# Patient Record
Sex: Female | Born: 1940
Health system: Southern US, Community
[De-identification: ages and names within clinical notes are randomized; demographics above are authoritative.]

## PROBLEM LIST (undated history)

## (undated) DIAGNOSIS — K449 Diaphragmatic hernia without obstruction or gangrene: Secondary | ICD-10-CM

## (undated) DIAGNOSIS — K5730 Diverticulosis of large intestine without perforation or abscess without bleeding: Secondary | ICD-10-CM

## (undated) DIAGNOSIS — I10 Essential (primary) hypertension: Secondary | ICD-10-CM

## (undated) DIAGNOSIS — K589 Irritable bowel syndrome without diarrhea: Secondary | ICD-10-CM

## (undated) DIAGNOSIS — E7800 Pure hypercholesterolemia, unspecified: Secondary | ICD-10-CM

## (undated) DIAGNOSIS — N390 Urinary tract infection, site not specified: Secondary | ICD-10-CM

## (undated) DIAGNOSIS — K219 Gastro-esophageal reflux disease without esophagitis: Secondary | ICD-10-CM

## (undated) DIAGNOSIS — M26629 Arthralgia of temporomandibular joint, unspecified side: Secondary | ICD-10-CM

## (undated) DIAGNOSIS — F32A Depression, unspecified: Secondary | ICD-10-CM

## (undated) DIAGNOSIS — R51 Headache: Secondary | ICD-10-CM

## (undated) DIAGNOSIS — K760 Fatty (change of) liver, not elsewhere classified: Secondary | ICD-10-CM

## (undated) DIAGNOSIS — K222 Esophageal obstruction: Secondary | ICD-10-CM

## (undated) DIAGNOSIS — M1990 Unspecified osteoarthritis, unspecified site: Secondary | ICD-10-CM

## (undated) DIAGNOSIS — F329 Major depressive disorder, single episode, unspecified: Secondary | ICD-10-CM

## (undated) HISTORY — DX: Urinary tract infection, site not specified: N39.0

## (undated) HISTORY — PX: APPENDECTOMY: SHX54

## (undated) HISTORY — DX: Esophageal obstruction: K22.2

## (undated) HISTORY — PX: BREAST EXCISIONAL BIOPSY: SUR124

## (undated) HISTORY — DX: Essential (primary) hypertension: I10

## (undated) HISTORY — DX: Fatty (change of) liver, not elsewhere classified: K76.0

## (undated) HISTORY — DX: Arthralgia of temporomandibular joint, unspecified side: M26.629

## (undated) HISTORY — DX: Unspecified osteoarthritis, unspecified site: M19.90

## (undated) HISTORY — DX: Diaphragmatic hernia without obstruction or gangrene: K44.9

## (undated) HISTORY — DX: Depression, unspecified: F32.A

## (undated) HISTORY — DX: Irritable bowel syndrome, unspecified: K58.9

## (undated) HISTORY — DX: Headache: R51

## (undated) HISTORY — DX: Gastro-esophageal reflux disease without esophagitis: K21.9

## (undated) HISTORY — PX: BREAST SURGERY: SHX581

## (undated) HISTORY — DX: Major depressive disorder, single episode, unspecified: F32.9

## (undated) HISTORY — DX: Diverticulosis of large intestine without perforation or abscess without bleeding: K57.30

## (undated) HISTORY — DX: Pure hypercholesterolemia, unspecified: E78.00

---

## 1981-05-06 HISTORY — PX: LUNG SURGERY: SHX703

## 1990-11-26 DIAGNOSIS — K449 Diaphragmatic hernia without obstruction or gangrene: Secondary | ICD-10-CM | POA: Insufficient documentation

## 1996-05-06 HISTORY — PX: ABDOMINAL HYSTERECTOMY: SHX81

## 1998-10-10 ENCOUNTER — Encounter: Payer: Self-pay | Admitting: Obstetrics & Gynecology

## 1998-10-10 ENCOUNTER — Ambulatory Visit (HOSPITAL_COMMUNITY): Admission: RE | Admit: 1998-10-10 | Discharge: 1998-10-10 | Payer: Self-pay | Admitting: Obstetrics & Gynecology

## 1998-10-13 ENCOUNTER — Ambulatory Visit (HOSPITAL_COMMUNITY): Admission: RE | Admit: 1998-10-13 | Discharge: 1998-10-13 | Payer: Self-pay | Admitting: Obstetrics & Gynecology

## 1998-10-13 ENCOUNTER — Encounter: Payer: Self-pay | Admitting: Obstetrics and Gynecology

## 1999-06-12 ENCOUNTER — Encounter: Admission: RE | Admit: 1999-06-12 | Discharge: 1999-06-12 | Payer: Self-pay | Admitting: Family Medicine

## 1999-06-12 ENCOUNTER — Encounter: Payer: Self-pay | Admitting: Family Medicine

## 1999-10-18 ENCOUNTER — Encounter: Payer: Self-pay | Admitting: Obstetrics and Gynecology

## 1999-10-18 ENCOUNTER — Ambulatory Visit (HOSPITAL_COMMUNITY): Admission: RE | Admit: 1999-10-18 | Discharge: 1999-10-18 | Payer: Self-pay | Admitting: Obstetrics and Gynecology

## 1999-10-25 ENCOUNTER — Encounter: Admission: RE | Admit: 1999-10-25 | Discharge: 1999-10-25 | Payer: Self-pay | Admitting: Obstetrics and Gynecology

## 1999-10-25 ENCOUNTER — Encounter: Payer: Self-pay | Admitting: Obstetrics and Gynecology

## 2000-11-25 ENCOUNTER — Encounter: Payer: Self-pay | Admitting: Obstetrics and Gynecology

## 2000-11-25 ENCOUNTER — Ambulatory Visit (HOSPITAL_COMMUNITY): Admission: RE | Admit: 2000-11-25 | Discharge: 2000-11-25 | Payer: Self-pay | Admitting: Obstetrics and Gynecology

## 2001-06-03 ENCOUNTER — Encounter: Payer: Self-pay | Admitting: Gastroenterology

## 2001-12-28 ENCOUNTER — Encounter: Payer: Self-pay | Admitting: Obstetrics and Gynecology

## 2001-12-28 ENCOUNTER — Ambulatory Visit (HOSPITAL_COMMUNITY): Admission: RE | Admit: 2001-12-28 | Discharge: 2001-12-28 | Payer: Self-pay | Admitting: Obstetrics and Gynecology

## 2002-01-01 ENCOUNTER — Encounter: Admission: RE | Admit: 2002-01-01 | Discharge: 2002-01-01 | Payer: Self-pay | Admitting: Obstetrics and Gynecology

## 2002-01-01 ENCOUNTER — Encounter: Payer: Self-pay | Admitting: Obstetrics and Gynecology

## 2002-06-03 ENCOUNTER — Encounter: Payer: Self-pay | Admitting: Orthopedic Surgery

## 2002-06-03 ENCOUNTER — Encounter: Admission: RE | Admit: 2002-06-03 | Discharge: 2002-06-03 | Payer: Self-pay | Admitting: Orthopedic Surgery

## 2003-01-03 ENCOUNTER — Ambulatory Visit (HOSPITAL_COMMUNITY): Admission: RE | Admit: 2003-01-03 | Discharge: 2003-01-03 | Payer: Self-pay | Admitting: Obstetrics and Gynecology

## 2003-01-03 ENCOUNTER — Encounter: Payer: Self-pay | Admitting: Obstetrics and Gynecology

## 2003-10-29 ENCOUNTER — Encounter: Admission: RE | Admit: 2003-10-29 | Discharge: 2003-10-29 | Payer: Self-pay | Admitting: Orthopedic Surgery

## 2004-02-10 ENCOUNTER — Ambulatory Visit (HOSPITAL_COMMUNITY): Admission: RE | Admit: 2004-02-10 | Discharge: 2004-02-10 | Payer: Self-pay | Admitting: Obstetrics and Gynecology

## 2004-06-06 ENCOUNTER — Ambulatory Visit (HOSPITAL_COMMUNITY): Admission: RE | Admit: 2004-06-06 | Discharge: 2004-06-06 | Payer: Self-pay | Admitting: Neurosurgery

## 2005-02-21 ENCOUNTER — Ambulatory Visit (HOSPITAL_COMMUNITY): Admission: RE | Admit: 2005-02-21 | Discharge: 2005-02-21 | Payer: Self-pay | Admitting: Obstetrics and Gynecology

## 2006-02-24 ENCOUNTER — Ambulatory Visit (HOSPITAL_COMMUNITY): Admission: RE | Admit: 2006-02-24 | Discharge: 2006-02-24 | Payer: Self-pay | Admitting: Obstetrics and Gynecology

## 2006-11-19 ENCOUNTER — Encounter: Admission: RE | Admit: 2006-11-19 | Discharge: 2006-11-19 | Payer: Self-pay | Admitting: Orthopedic Surgery

## 2007-02-24 ENCOUNTER — Ambulatory Visit: Payer: Self-pay | Admitting: Gastroenterology

## 2007-02-26 ENCOUNTER — Ambulatory Visit (HOSPITAL_COMMUNITY): Admission: RE | Admit: 2007-02-26 | Discharge: 2007-02-26 | Payer: Self-pay | Admitting: Obstetrics and Gynecology

## 2007-03-06 ENCOUNTER — Encounter: Admission: RE | Admit: 2007-03-06 | Discharge: 2007-03-06 | Payer: Self-pay | Admitting: Obstetrics and Gynecology

## 2007-03-12 ENCOUNTER — Ambulatory Visit (HOSPITAL_COMMUNITY): Admission: RE | Admit: 2007-03-12 | Discharge: 2007-03-12 | Payer: Self-pay | Admitting: Gastroenterology

## 2007-03-17 ENCOUNTER — Ambulatory Visit: Payer: Self-pay | Admitting: Gastroenterology

## 2007-03-17 DIAGNOSIS — K589 Irritable bowel syndrome without diarrhea: Secondary | ICD-10-CM

## 2007-03-17 DIAGNOSIS — K5730 Diverticulosis of large intestine without perforation or abscess without bleeding: Secondary | ICD-10-CM

## 2007-03-17 HISTORY — DX: Diverticulosis of large intestine without perforation or abscess without bleeding: K57.30

## 2007-07-07 DIAGNOSIS — E7800 Pure hypercholesterolemia, unspecified: Secondary | ICD-10-CM

## 2007-07-07 DIAGNOSIS — I10 Essential (primary) hypertension: Secondary | ICD-10-CM

## 2007-07-07 DIAGNOSIS — M1990 Unspecified osteoarthritis, unspecified site: Secondary | ICD-10-CM | POA: Insufficient documentation

## 2007-07-07 DIAGNOSIS — F339 Major depressive disorder, recurrent, unspecified: Secondary | ICD-10-CM | POA: Insufficient documentation

## 2007-07-07 HISTORY — DX: Essential (primary) hypertension: I10

## 2007-07-07 HISTORY — DX: Pure hypercholesterolemia, unspecified: E78.00

## 2008-03-08 ENCOUNTER — Ambulatory Visit (HOSPITAL_COMMUNITY): Admission: RE | Admit: 2008-03-08 | Discharge: 2008-03-08 | Payer: Self-pay | Admitting: Obstetrics and Gynecology

## 2008-08-09 ENCOUNTER — Ambulatory Visit: Payer: Self-pay | Admitting: Family Medicine

## 2008-08-09 DIAGNOSIS — J310 Chronic rhinitis: Secondary | ICD-10-CM

## 2008-08-09 DIAGNOSIS — R519 Headache, unspecified: Secondary | ICD-10-CM | POA: Insufficient documentation

## 2008-08-09 DIAGNOSIS — K219 Gastro-esophageal reflux disease without esophagitis: Secondary | ICD-10-CM | POA: Insufficient documentation

## 2008-08-09 DIAGNOSIS — R51 Headache: Secondary | ICD-10-CM | POA: Insufficient documentation

## 2008-08-09 HISTORY — DX: Gastro-esophageal reflux disease without esophagitis: K21.9

## 2008-10-13 ENCOUNTER — Ambulatory Visit: Payer: Self-pay | Admitting: Family Medicine

## 2008-10-13 LAB — CONVERTED CEMR LAB
ALT: 15 units/L (ref 0–35)
AST: 21 units/L (ref 0–37)
Albumin: 3.6 g/dL (ref 3.5–5.2)
Alkaline Phosphatase: 54 units/L (ref 39–117)
Bilirubin, Direct: 0 mg/dL (ref 0.0–0.3)
CO2: 29 meq/L (ref 19–32)
Calcium: 9.4 mg/dL (ref 8.4–10.5)
GFR calc non Af Amer: 75.49 mL/min (ref >60)
HDL: 42.1 mg/dL (ref >39.00)
LDL Cholesterol: 73 mg/dL (ref 0–99)
Sodium: 145 meq/L (ref 135–145)
VLDL: 15.8 mg/dL (ref 0.0–40.0)

## 2008-10-20 ENCOUNTER — Ambulatory Visit: Payer: Self-pay | Admitting: Family Medicine

## 2008-10-20 DIAGNOSIS — M26609 Unspecified temporomandibular joint disorder, unspecified side: Secondary | ICD-10-CM | POA: Insufficient documentation

## 2008-11-01 ENCOUNTER — Telehealth: Payer: Self-pay | Admitting: Family Medicine

## 2009-01-02 ENCOUNTER — Ambulatory Visit: Payer: Self-pay | Admitting: Family Medicine

## 2009-02-20 ENCOUNTER — Ambulatory Visit: Payer: Self-pay | Admitting: Family Medicine

## 2009-03-02 IMAGING — MG MM DIGITAL SCREENING BILAT
4 series · 4 of 4 positions shown · non-contrast
Comparison: none

DG SCREEN MAMMOGRAM BILATERAL
Bilateral CC and MLO view(s) were taken.

DIGITAL SCREENING MAMMOGRAM WITH CAD:
There are scattered fibroglandular densities.  A possible mass is noted in the right breast.  Spot 
compression views and possibly sonography are recommended for further evaluation.  In the left 
breast, no masses or malignant type calcifications are identified.  Compared with prior studies.

[R CC]
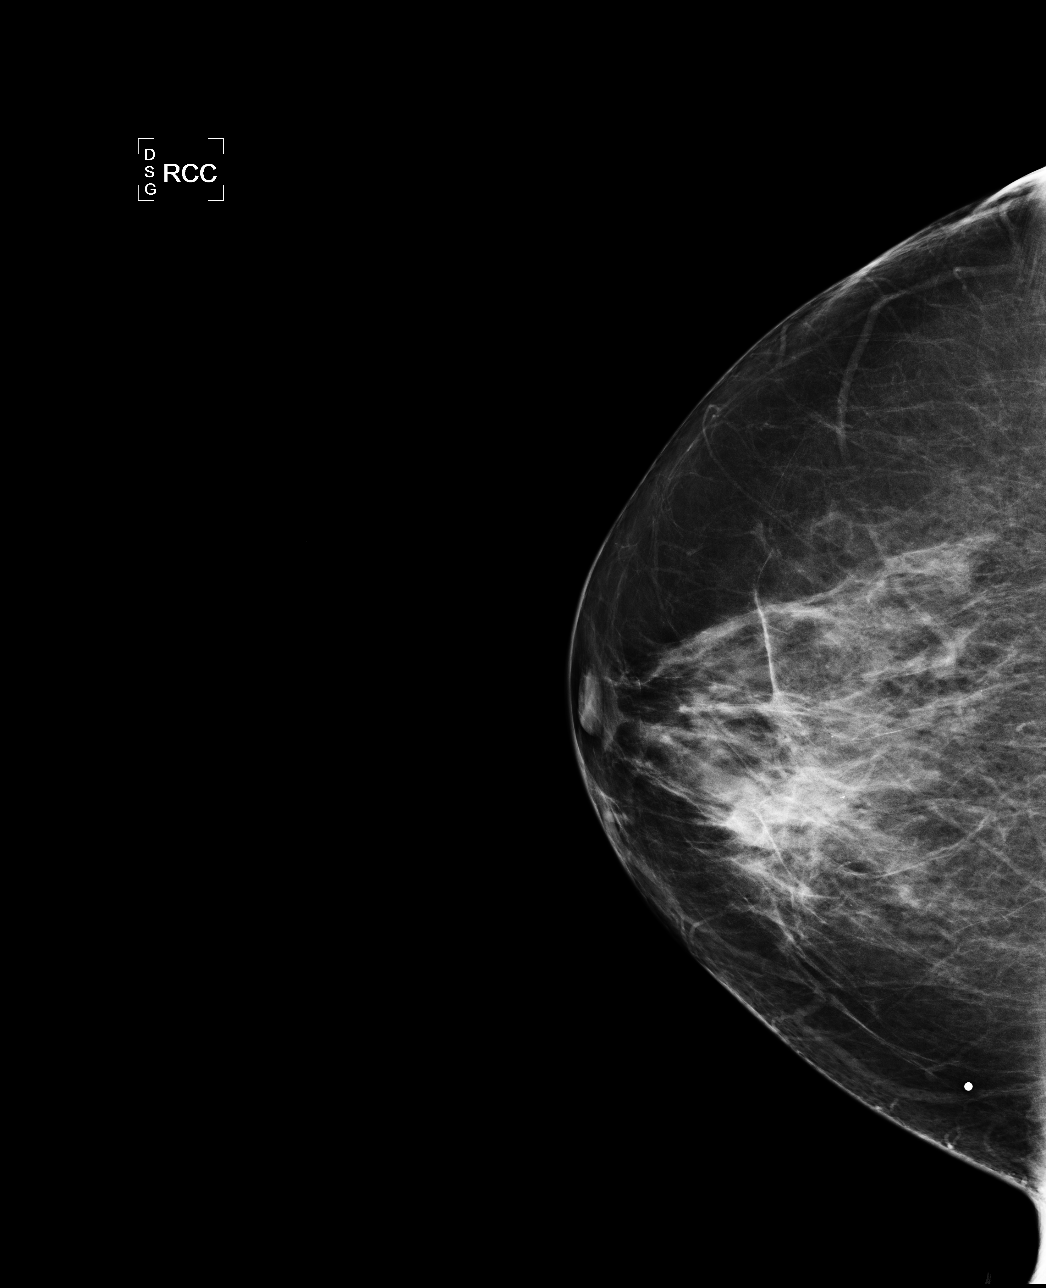

[R MLO]
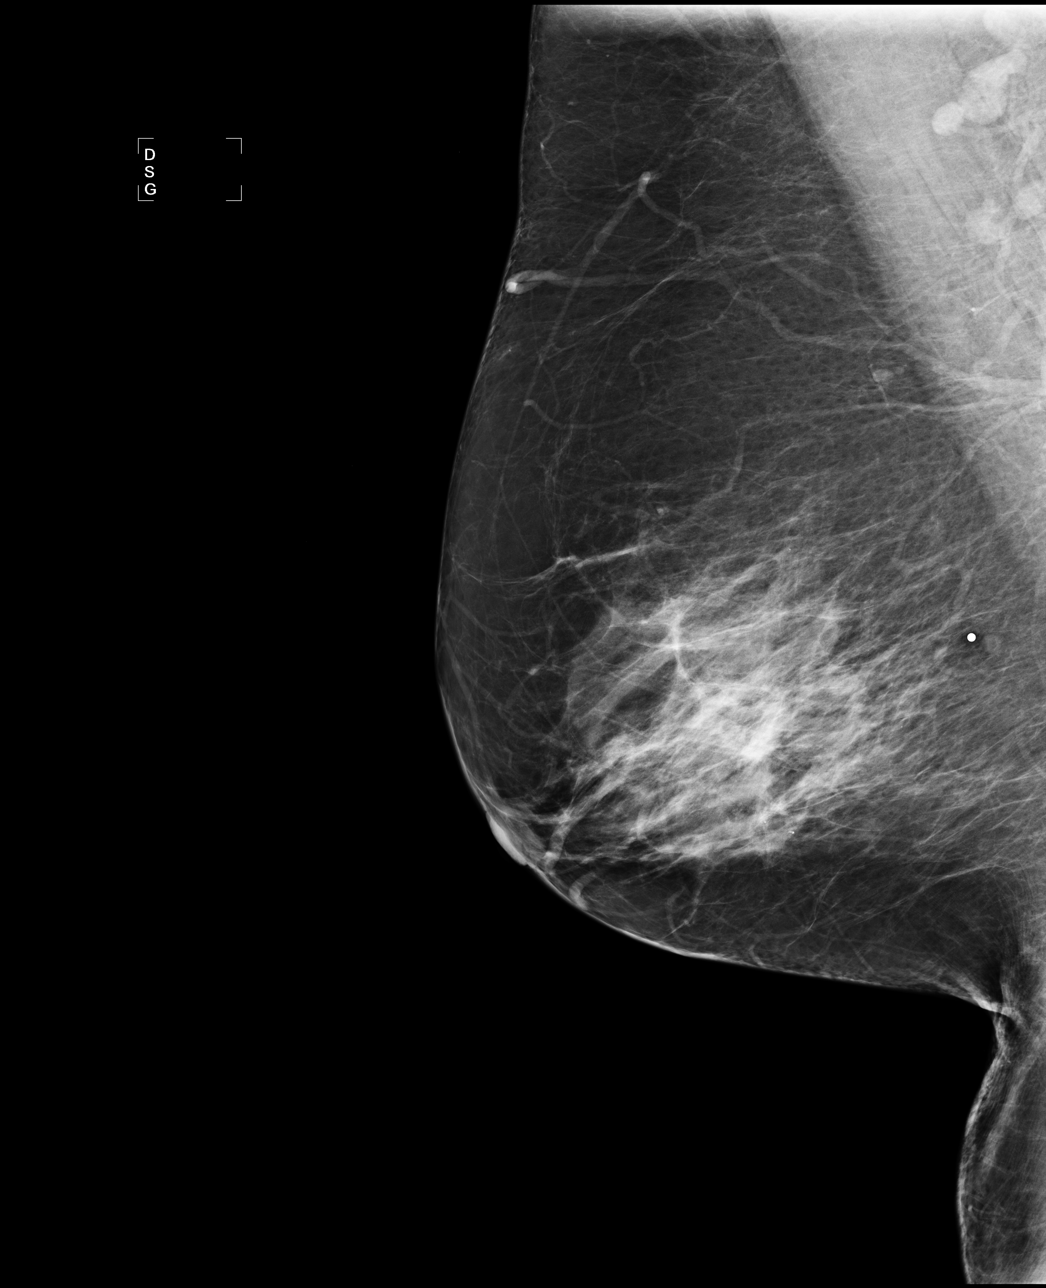

[L CC]
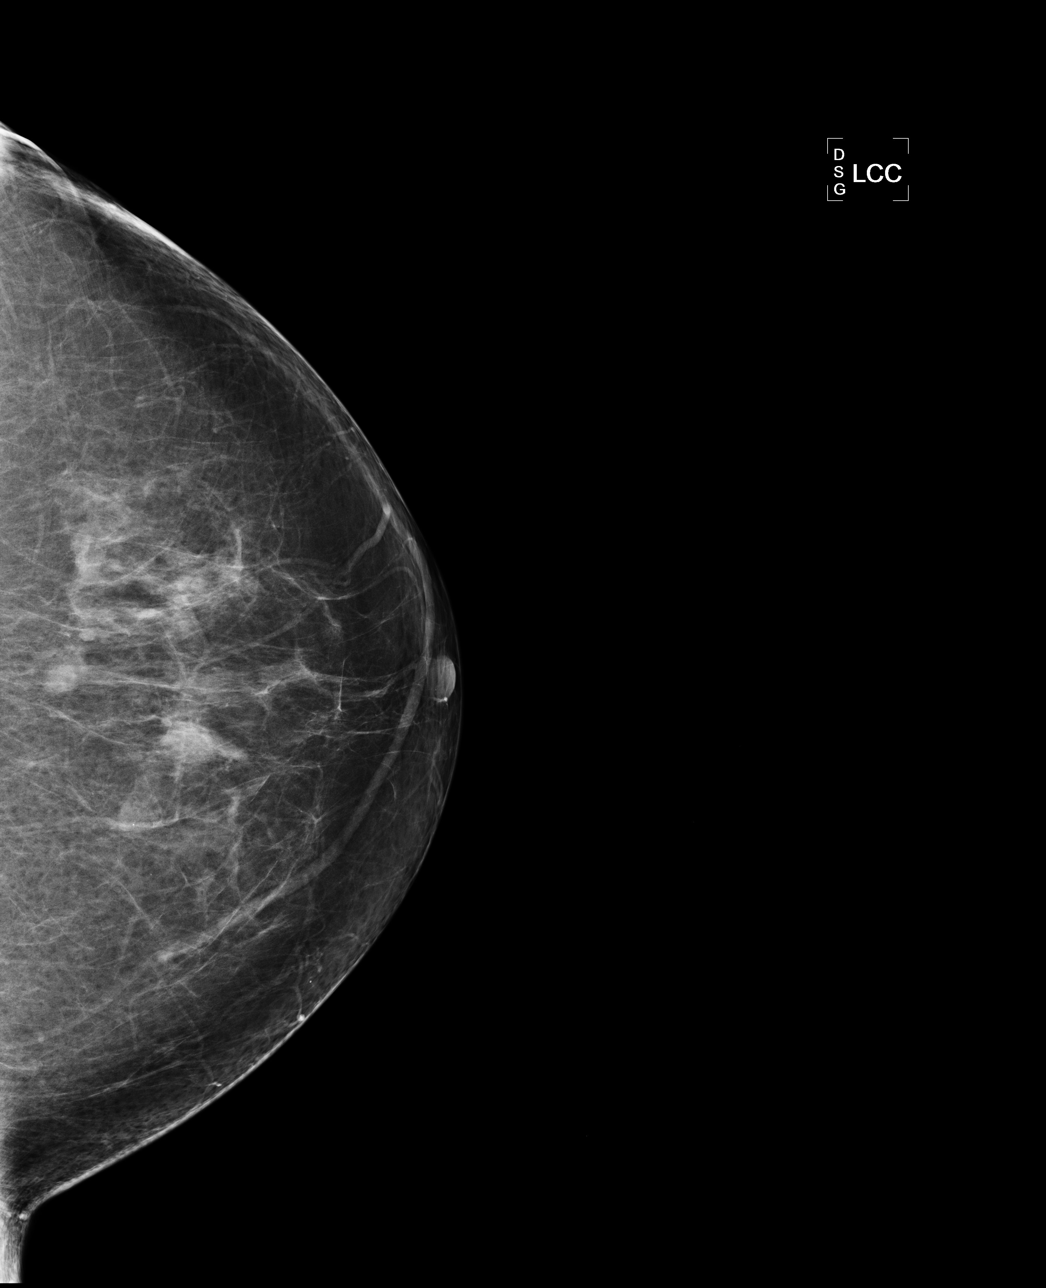

[L MLO]
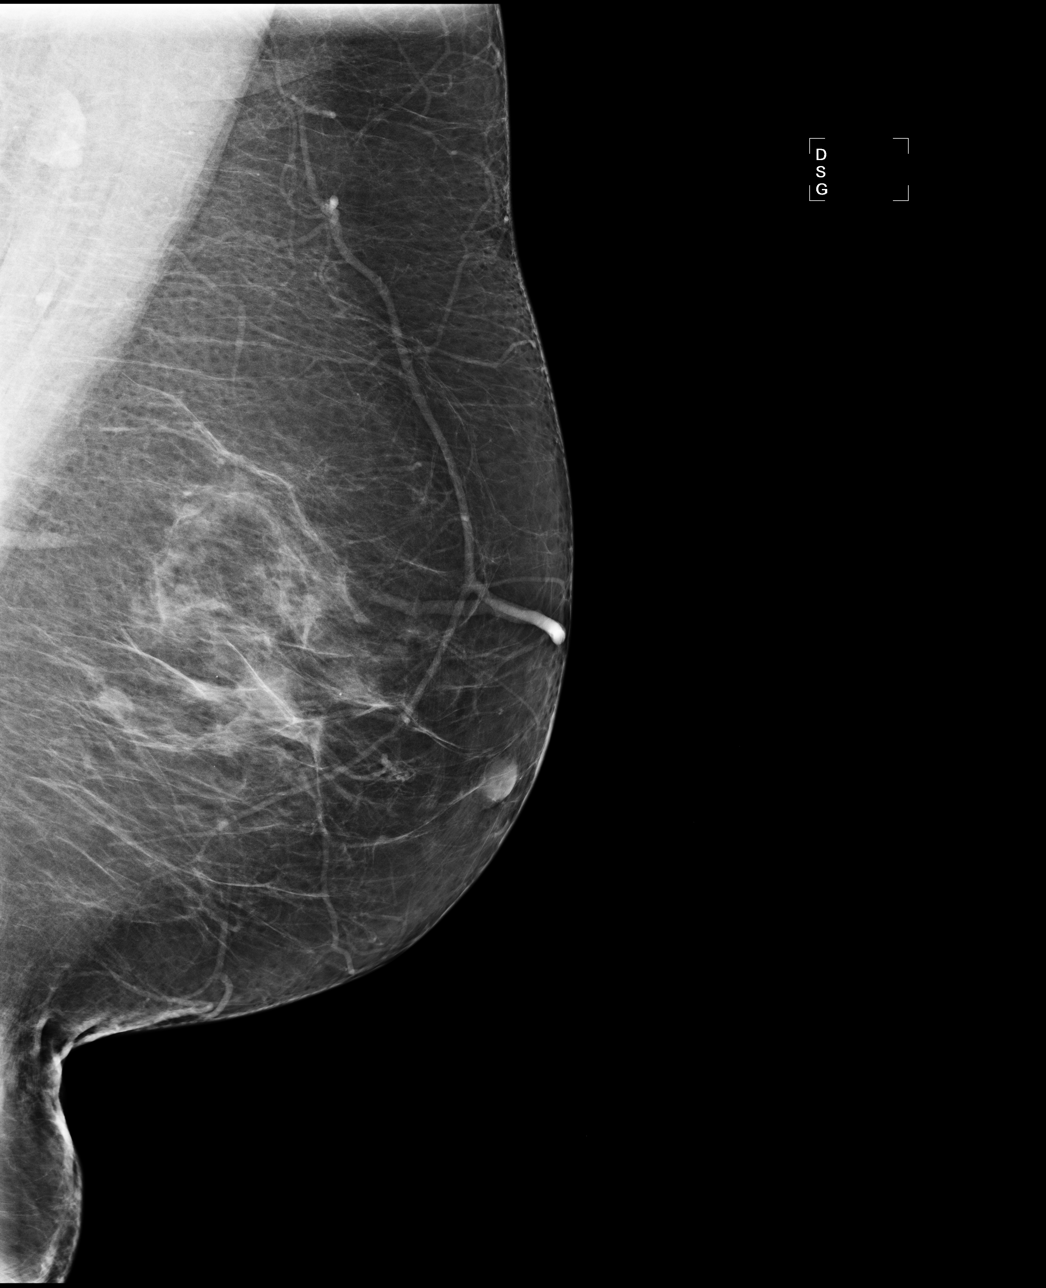

[4 of 4 positions shown; findings below may reference images not displayed]

IMPRESSION: Possible mass, right breast.  Additional evaluation is indicated.  The patient will be contacted 
for additional studies and a supplementary report will follow.  No specific mammographic evidence 
of malignancy, left breast.

ASSESSMENT: Need additional imaging evaluation and/or prior mammograms for comparison - BI-RADS 0

Further imaging of the right breast.
ANALYZED BY COMPUTER AIDED DETECTION. , THIS PROCEDURE WAS A DIGITAL MAMMOGRAM.

## 2009-03-10 ENCOUNTER — Ambulatory Visit (HOSPITAL_COMMUNITY): Admission: RE | Admit: 2009-03-10 | Discharge: 2009-03-10 | Payer: Self-pay | Admitting: Obstetrics and Gynecology

## 2009-05-11 ENCOUNTER — Ambulatory Visit: Payer: Self-pay | Admitting: Family Medicine

## 2009-07-22 ENCOUNTER — Encounter: Admission: RE | Admit: 2009-07-22 | Discharge: 2009-07-22 | Payer: Self-pay | Admitting: Orthopedic Surgery

## 2009-09-05 ENCOUNTER — Ambulatory Visit: Payer: Self-pay | Admitting: Family Medicine

## 2009-09-06 LAB — CONVERTED CEMR LAB
ALT: 15 units/L (ref 0–35)
AST: 20 units/L (ref 0–37)
Albumin: 3.7 g/dL (ref 3.5–5.2)
Alkaline Phosphatase: 44 units/L (ref 39–117)
BUN: 17 mg/dL (ref 6–23)
Bilirubin, Direct: 0.1 mg/dL (ref 0.0–0.3)
CO2: 31 meq/L (ref 19–32)
Calcium: 9.4 mg/dL (ref 8.4–10.5)
Chloride: 104 meq/L (ref 96–112)
Cholesterol: 144 mg/dL (ref 0–200)
Creatinine, Ser: 0.8 mg/dL (ref 0.4–1.2)
GFR calc non Af Amer: 75.29 mL/min (ref >60)
Glucose, Bld: 68 mg/dL — ABNORMAL LOW (ref 70–99)
HDL: 48.9 mg/dL (ref >39.00)
LDL Cholesterol: 85 mg/dL (ref 0–99)
Potassium: 4.3 meq/L (ref 3.5–5.1)
Sodium: 142 meq/L (ref 135–145)
Total Bilirubin: 0.7 mg/dL (ref 0.3–1.2)
Total CHOL/HDL Ratio: 3
Total Protein: 6.6 g/dL (ref 6.0–8.3)
Triglycerides: 53 mg/dL (ref 0.0–149.0)
VLDL: 10.6 mg/dL (ref 0.0–40.0)

## 2009-09-15 ENCOUNTER — Ambulatory Visit: Payer: Self-pay | Admitting: Family Medicine

## 2009-10-11 ENCOUNTER — Ambulatory Visit: Payer: Self-pay | Admitting: Family Medicine

## 2009-10-11 DIAGNOSIS — R300 Dysuria: Secondary | ICD-10-CM | POA: Insufficient documentation

## 2009-10-12 ENCOUNTER — Encounter: Payer: Self-pay | Admitting: Family Medicine

## 2009-10-27 ENCOUNTER — Telehealth: Payer: Self-pay | Admitting: Family Medicine

## 2009-11-03 ENCOUNTER — Ambulatory Visit: Payer: Self-pay | Admitting: Family Medicine

## 2009-11-03 LAB — CONVERTED CEMR LAB
Bilirubin Urine: NEGATIVE
Glucose, Urine, Semiquant: NEGATIVE
pH: 7

## 2010-01-27 ENCOUNTER — Emergency Department (HOSPITAL_COMMUNITY)
Admission: EM | Admit: 2010-01-27 | Discharge: 2010-01-27 | Payer: Self-pay | Source: Home / Self Care | Admitting: Emergency Medicine

## 2010-01-31 DIAGNOSIS — R079 Chest pain, unspecified: Secondary | ICD-10-CM | POA: Insufficient documentation

## 2010-02-01 ENCOUNTER — Ambulatory Visit: Payer: Self-pay | Admitting: Cardiovascular Disease

## 2010-02-01 ENCOUNTER — Telehealth: Payer: Self-pay | Admitting: Family Medicine

## 2010-02-01 DIAGNOSIS — R002 Palpitations: Secondary | ICD-10-CM | POA: Insufficient documentation

## 2010-02-08 ENCOUNTER — Telehealth (INDEPENDENT_AMBULATORY_CARE_PROVIDER_SITE_OTHER): Payer: Self-pay

## 2010-02-09 ENCOUNTER — Ambulatory Visit: Payer: Self-pay | Admitting: Cardiology

## 2010-02-09 ENCOUNTER — Ambulatory Visit (HOSPITAL_COMMUNITY): Admission: RE | Admit: 2010-02-09 | Discharge: 2010-02-09 | Payer: Self-pay | Admitting: Cardiovascular Disease

## 2010-02-09 ENCOUNTER — Encounter: Payer: Self-pay | Admitting: Cardiovascular Disease

## 2010-02-09 ENCOUNTER — Ambulatory Visit: Payer: Self-pay

## 2010-02-13 ENCOUNTER — Ambulatory Visit: Payer: Self-pay | Admitting: Family Medicine

## 2010-02-13 DIAGNOSIS — R5383 Other fatigue: Secondary | ICD-10-CM

## 2010-02-13 DIAGNOSIS — R5381 Other malaise: Secondary | ICD-10-CM

## 2010-02-13 LAB — CONVERTED CEMR LAB
Bilirubin Urine: NEGATIVE
Blood in Urine, dipstick: NEGATIVE
Glucose, Urine, Semiquant: NEGATIVE
Ketones, urine, test strip: NEGATIVE
Nitrite: NEGATIVE
Specific Gravity, Urine: 1.01
Urobilinogen, UA: 0.2

## 2010-02-14 ENCOUNTER — Encounter: Payer: Self-pay | Admitting: Family Medicine

## 2010-03-16 ENCOUNTER — Ambulatory Visit (HOSPITAL_COMMUNITY): Admission: RE | Admit: 2010-03-16 | Discharge: 2010-03-16 | Payer: Self-pay | Admitting: Obstetrics and Gynecology

## 2010-03-23 ENCOUNTER — Encounter (INDEPENDENT_AMBULATORY_CARE_PROVIDER_SITE_OTHER): Payer: Self-pay | Admitting: *Deleted

## 2010-05-15 ENCOUNTER — Other Ambulatory Visit: Payer: Self-pay | Admitting: Gastroenterology

## 2010-05-15 ENCOUNTER — Encounter (INDEPENDENT_AMBULATORY_CARE_PROVIDER_SITE_OTHER): Payer: Self-pay | Admitting: *Deleted

## 2010-05-15 ENCOUNTER — Ambulatory Visit
Admission: RE | Admit: 2010-05-15 | Discharge: 2010-05-15 | Payer: Self-pay | Source: Home / Self Care | Attending: Gastroenterology | Admitting: Gastroenterology

## 2010-05-15 DIAGNOSIS — N390 Urinary tract infection, site not specified: Secondary | ICD-10-CM | POA: Insufficient documentation

## 2010-05-15 DIAGNOSIS — R1011 Right upper quadrant pain: Secondary | ICD-10-CM | POA: Insufficient documentation

## 2010-05-15 DIAGNOSIS — K222 Esophageal obstruction: Secondary | ICD-10-CM | POA: Insufficient documentation

## 2010-05-15 LAB — IBC PANEL
Iron: 88 ug/dL (ref 42–145)
Saturation Ratios: 26.2 % (ref 20.0–50.0)
Transferrin: 239.7 mg/dL (ref 212.0–360.0)

## 2010-05-15 LAB — CBC WITH DIFFERENTIAL/PLATELET
Basophils Absolute: 0 10*3/uL (ref 0.0–0.1)
Basophils Relative: 0.3 % (ref 0.0–3.0)
Eosinophils Absolute: 0.1 10*3/uL (ref 0.0–0.7)
Eosinophils Relative: 1.3 % (ref 0.0–5.0)
HCT: 40 % (ref 36.0–46.0)
Hemoglobin: 13.9 g/dL (ref 12.0–15.0)
Lymphocytes Relative: 34.6 % (ref 12.0–46.0)
Lymphs Abs: 2.3 10*3/uL (ref 0.7–4.0)
MCHC: 34.9 g/dL (ref 30.0–36.0)
MCV: 94.2 fl (ref 78.0–100.0)
Monocytes Absolute: 0.5 10*3/uL (ref 0.1–1.0)
Monocytes Relative: 7.3 % (ref 3.0–12.0)
Neutro Abs: 3.8 10*3/uL (ref 1.4–7.7)
Neutrophils Relative %: 56.5 % (ref 43.0–77.0)
Platelets: 278 10*3/uL (ref 150.0–400.0)
RBC: 4.24 Mil/uL (ref 3.87–5.11)
RDW: 12.1 % (ref 11.5–14.6)
WBC: 6.7 10*3/uL (ref 4.5–10.5)

## 2010-05-15 LAB — MAGNESIUM: Magnesium: 1.9 mg/dL (ref 1.5–2.5)

## 2010-05-15 LAB — VITAMIN B12: Vitamin B-12: 417 pg/mL (ref 211–911)

## 2010-05-15 LAB — FERRITIN: Ferritin: 145.3 ng/mL (ref 10.0–291.0)

## 2010-05-15 LAB — FOLATE: Folate: >20 ng/mL

## 2010-05-18 ENCOUNTER — Ambulatory Visit
Admission: RE | Admit: 2010-05-18 | Discharge: 2010-05-18 | Payer: Self-pay | Source: Home / Self Care | Attending: Family Medicine | Admitting: Family Medicine

## 2010-05-21 ENCOUNTER — Ambulatory Visit (HOSPITAL_COMMUNITY)
Admission: RE | Admit: 2010-05-21 | Discharge: 2010-05-21 | Payer: Self-pay | Source: Home / Self Care | Attending: Gastroenterology | Admitting: Gastroenterology

## 2010-05-23 ENCOUNTER — Ambulatory Visit
Admission: RE | Admit: 2010-05-23 | Discharge: 2010-05-23 | Payer: Self-pay | Source: Home / Self Care | Attending: Gastroenterology | Admitting: Gastroenterology

## 2010-05-23 ENCOUNTER — Other Ambulatory Visit: Payer: Self-pay | Admitting: Gastroenterology

## 2010-05-24 LAB — HELICOBACTER PYLORI SCREEN-BIOPSY: UREASE: NEGATIVE

## 2010-05-27 ENCOUNTER — Encounter: Payer: Self-pay | Admitting: Obstetrics and Gynecology

## 2010-06-04 ENCOUNTER — Encounter: Payer: Self-pay | Admitting: Gastroenterology

## 2010-06-07 NOTE — Assessment & Plan Note (Signed)
Summary: cough/congestion/pt will come in fasting/njr   Vital Signs:  Patient profile:   70 year old female O2 Sat:      97 % on Room air Temp:     98.7 degrees F oral Pulse rate:   68 / minute BP sitting:   150 / 80  (left arm) Cuff size:   regular  Vitals Entered By: Sid Falcon LPN (May 11, 2009 8:36 AM)  O2 Flow:  Room air CC: cougn, congestion X 7-14 days, Hypertension Management, Lipid Management   History of Present Illness: Followup multiple medical problems.  Patient also has acute issue of 7-10 day history of nasal congestion and dry cough. Body aches initially which seemed to be clearing. No associated fever. No purulent nasal discharge. Has taken over-the-counter medications with moderate relief.  Hypertension and needs refills of medications. Blood pressure is controlled well by home readings. Hyperlipidemia treat with simvastatin. Last total cholesterol 131. Patient requests reduction medication. No associted myalgias with statin therapy.  Also takes Niacin.  Hx lipoprotein profiel and  has shift toward small dense LDL. GERD symptoms have been well controlled with Nexium. She needs refills.  Hypertension History:      She denies headache, chest pain, palpitations, dyspnea with exertion, orthopnea, PND, peripheral edema, visual symptoms, neurologic problems, syncope, and side effects from treatment.  She notes no problems with any antihypertensive medication side effects.        Positive major cardiovascular risk factors include female age 92 years old or older, hyperlipidemia, and hypertension.  Negative major cardiovascular risk factors include no history of diabetes, negative family history for ischemic heart disease, and non-tobacco-user status.        Further assessment for target organ damage reveals no history of ASHD, stroke/TIA, or peripheral vascular disease.    Lipid Management History:      Positive NCEP/ATP III risk factors include female age 57 years  old or older and hypertension.  Negative NCEP/ATP III risk factors include no history of early menopause without estrogen hormone replacement, non-diabetic, no family history for ischemic heart disease, non-tobacco-user status, no ASHD (atherosclerotic heart disease), no prior stroke/TIA, no peripheral vascular disease, and no history of aortic aneurysm.      Allergies: 1)  ! Codeine Sulfate (Codeine Sulfate) 2)  ! Antihistamine  Past History:  Past Medical History: Last updated: 08/09/2008 Current Problems:  HIATAL HERNIA (ICD-553.3) IRRITABLE BOWEL SYNDROME (ICD-564.1) DIVERTICULOSIS, COLON (ICD-562.10) DEPRESSION, CHRONIC (ICD-311) DEGENERATIVE JOINT DISEASE (ICD-715.90) HYPERCHOLESTEROLEMIA (ICD-272.0) HYPERTENSION (ICD-401.9) GERD Headache Hay fever, allergies  Past Surgical History: Last updated: 08/09/2008 Appendectomy Hysterectomy 1998 Breast surgery/ milk gland Benign tumor, ovary 1969 Lung surgery, scar tissue, 1983  Social History: Last updated: 10/20/2008 Retired Widow/Widower Never Smoked FH reviewed for relevance  Review of Systems      See HPI  Physical Exam  General:  Well-developed,well-nourished,in no acute distress; alert,appropriate and cooperative throughout examination Head:  Normocephalic and atraumatic without obvious abnormalities. No apparent alopecia or balding. Eyes:  No corneal or conjunctival inflammation noted. EOMI. Perrla. Funduscopic exam benign, without hemorrhages, exudates or papilledema. Vision grossly normal. Ears:  External ear exam shows no significant lesions or deformities.  Otoscopic examination reveals clear canals, tympanic membranes are intact bilaterally without bulging, retraction, inflammation or discharge. Hearing is grossly normal bilaterally. Nose:  External nasal examination shows no deformity or inflammation. Nasal mucosa are pink and moist without lesions or exudates. Mouth:  Oral mucosa and oropharynx without  lesions or exudates.  Teeth in good  repair. Neck:  No deformities, masses, or tenderness noted. Lungs:  Normal respiratory effort, chest expands symmetrically. Lungs are clear to auscultation, no crackles or wheezes. Heart:  Normal rate and regular rhythm. S1 and S2 normal without gallop, murmur, click, rub or other extra sounds. Abdomen:  soft and nontender Extremities:  no edema   Impression & Recommendations:  Problem # 1:  HYPERCHOLESTEROLEMIA (ICD-272.0) discuss reducing simvastatin to 20 mg and recheck lipids 3-4 months Her updated medication list for this problem includes:    Simvastatin 40 Mg Tabs (Simvastatin) ..... Once daily    Niacin Cr 500 Mg Cr-caps (Niacin) ..... Once daily  Problem # 2:  HYPERTENSION (ICD-401.9) probable whitecoat syndrome. Patient encouraged to bring back home cuff with next visit. Good control by home readings. Refill medications Her updated medication list for this problem includes:    Toprol Xl 100 Mg Xr24h-tab (Metoprolol succinate) ..... Once daily    Hydrochlorothiazide 25 Mg Tabs (Hydrochlorothiazide) ..... Once daily    Lisinopril 10 Mg Tabs (Lisinopril) ..... Once daily  Problem # 3:  GERD (ICD-530.81)  Her updated medication list for this problem includes:    Nexium 40 Mg Cpdr (Esomeprazole magnesium) ..... Once daily as needed  Problem # 4:  VIRAL URI (ICD-465.9) Assessment: New no indication for Abs at this time.  She will give this more time. Her updated medication list for this problem includes:    Adprin B 325 Mg Tabs (Aspirin buf(cacarb-mgcarb-mgo)) ..... Once daily  Complete Medication List: 1)  Toprol Xl 100 Mg Xr24h-tab (Metoprolol succinate) .... Once daily 2)  Nexium 40 Mg Cpdr (Esomeprazole magnesium) .... Once daily as needed 3)  Simvastatin 40 Mg Tabs (Simvastatin) .... Once daily 4)  Niacin Cr 500 Mg Cr-caps (Niacin) .... Once daily 5)  Fish Oil Concentrate 1000 Mg Caps (Omega-3 fatty acids) .... Once daily 6)  Adprin  B 325 Mg Tabs (Aspirin buf(cacarb-mgcarb-mgo)) .... Once daily 7)  Hydrochlorothiazide 25 Mg Tabs (Hydrochlorothiazide) .... Once daily 8)  Lisinopril 10 Mg Tabs (Lisinopril) .... Once daily  Hypertension Assessment/Plan:      The patient's hypertensive risk group is category B: At least one risk factor (excluding diabetes) with no target organ damage.  Her calculated 10 year risk of coronary heart disease is 13 %.  Today's blood pressure is 150/80.    Lipid Assessment/Plan:      Based on NCEP/ATP III, the patient's risk factor category is "0-1 risk factors".  The patient's lipid goals are as follows: Total cholesterol goal is 200; LDL cholesterol goal is 130; HDL cholesterol goal is 40; Triglyceride goal is 150.    Patient Instructions: 1)  Try reducing simvastatin to 40 mg one half tablet daily 2)  It is important that you exercise reguarly at least 20 minutes 5 times a week. If you develop chest pain, have severe difficulty breathing, or feel very tired, stop exercising immediately and seek medical attention.  3)  You need to lose weight. Consider a lower calorie diet and regular exercise.  4)  Please schedule a follow-up appointment in 4 months .  5)  BMP prior to visit, ICD-9: 401.9 6)  Hepatic Panel prior to visit ICD-9: 272.4 7)  Lipid panel prior to visit ICD-9 : 272.4 Prescriptions: LISINOPRIL 10 MG TABS (LISINOPRIL) once daily  #90 x 3   Entered and Authorized by:   Evelena Peat MD   Signed by:   Evelena Peat MD on 05/11/2009   Method used:   [facility] to  [facility]  [address] [phone]* (retail)       [address]       [place], [place]  [address]       Ph: [phone] or [phone]       Fax: [phone]   RxID:   [id number] HYDROCHLOROTHIAZIDE 25 MG TABS (HYDROCHLOROTHIAZIDE) once daily  #90 x 3   Entered and Authorized by:   Evelena Peat MD   Signed by:   Evelena Peat MD on 05/11/2009   Method used:   [facility] to          [facility] [phone]* (retail)       [address]       [place], [place]  [address]       Ph: [phone] or [phone]       Fax: [phone]   RxID:   [id number] TOPROL XL 100 MG XR24H-TAB (METOPROLOL SUCCINATE) once daily  #90 x 3   Entered and Authorized by:   Evelena Peat MD   Signed by:   Evelena Peat MD on 05/11/2009   Method used:   [facility] to        [facility] [phone][facility] (retail)       [address]       [place], [place]  [address]       Ph: [phone] or [phone]       Fax: [phone]   RxID:   [id number]

## 2010-06-07 NOTE — Assessment & Plan Note (Signed)
Summary: problems with meds/cjr   Vital Signs:  Patient profile:   70 year old female Weight:      168 pounds Temp:     98.2 degrees F oral BP sitting:   148 / 80  (left arm) Cuff size:   regular  Vitals Entered By: Sid Falcon LPN (May 18, 2010 1:29 PM)  History of Present Illness: Here for follow up hypertension and for recent URI  Insurance requesting change in current beta blocker therapy.        This is a 70 year old woman who presents with URI symptoms.  The patient complains of nasal congestion, clear nasal discharge, and dry cough, but denies purulent nasal discharge, sore throat, productive cough, and earache.  The patient denies fever, stiff neck, and wheezing.  The patient denies headache.  The patient denies the following risk factors for Strep sinusitis: unilateral facial pain, double sickening, Strep exposure, and tender adenopathy.    Hypertension History:      She denies headache, chest pain, palpitations, dyspnea with exertion, orthopnea, peripheral edema, visual symptoms, neurologic problems, syncope, and side effects from treatment.        Positive major cardiovascular risk factors include female age 72 years old or older or older, hyperlipidemia, hypertension, and current tobacco user.  Negative major cardiovascular risk factors include no history of diabetes and negative family history for ischemic heart disease.        Further assessment for target organ damage reveals no history of ASHD, stroke/TIA, or peripheral vascular disease.     Allergies: 1)  ! Codeine Sulfate (Codeine Sulfate) 2)  ! Antihistamine  Past History:  Past Medical History: Last updated: 05/15/2010 Chest Pains: Seen in ER 01/27/10 Discussed with [facility]  outpatient F/U and stress test needed HYPERCHOLESTEROLEMIA  HYPERTENSION DYSURIA TMJ SYNDROME  HEADACHE  GERD  TICK BITE  RHINITIS HIATAL HERNIA  IRRITABLE BOWEL SYNDROME  DIVERTICULOSIS, COLON  DEPRESSION, CHRONIC  DEGENERATIVE JOINT  DISEASE Hay fever, allergies Esophageal Stricture Hx of UTI   Social History: Last updated: 05/15/2010 Retired Widow/Widower Childern Patient currently smokes: Occ or once in awhile  Alcohol Use - no Daily Caffeine Use 3-4 daily  Illicit Drug Use - no PMH reviewed for relevance, SH/Risk Factors reviewed for relevance  Review of Systems      See HPI  Physical Exam  General:  Well-developed,well-nourished,in no acute distress; alert,appropriate and cooperative throughout examination Head:  Normocephalic and atraumatic without obvious abnormalities. No apparent alopecia or balding. Eyes:  pupils equal, pupils round, and pupils reactive to light.   Ears:  External ear exam shows no significant lesions or deformities.  Otoscopic examination reveals clear canals, tympanic membranes are intact bilaterally without bulging, retraction, inflammation or discharge. Hearing is grossly normal bilaterally. Mouth:  Oral mucosa and oropharynx without lesions or exudates.  Teeth in good repair. Neck:  No deformities, masses, or tenderness noted. Lungs:  Normal respiratory effort, chest expands symmetrically. Lungs are clear to auscultation, no crackles or wheezes. Heart:  normal rate, regular rhythm, and no gallop.   Extremities:  No clubbing, cyanosis, edema, or deformity noted with normal full range of motion of all joints.   Skin:  no rashes.   Cervical Nodes:  No lymphadenopathy noted Psych:  normally interactive, good eye contact, not anxious appearing, and not depressed appearing.     Impression & Recommendations:  Problem # 1:  HYPERTENSION (ICD-401.9) change of med to metoprolol tartrate 100mg  one half two times a day. Her updated medication list  for this problem includes:    Metoprolol Tartrate 100 Mg Tabs (Metoprolol tartrate) ..... One half by mouth two times a day    Hydrochlorothiazide 25 Mg Tabs (Hydrochlorothiazide) ..... Once daily    Lisinopril 10 Mg Tabs (Lisinopril) .....  Once daily  Problem # 2:  VIRAL URI (ICD-465.9) cont mucinex. Her updated medication list for this problem includes:    Aspirin Ec 325 Mg Tbec (Aspirin) .Marland Kitchen... Take one tablet by mouth daily    Mucinex Fast-max Cold & Sinus 10-650-400 Mg/69ml Liqd (Phenylephrine-apap-guaifenesin) .Marland Kitchen... As needed  Complete Medication List: 1)  Metoprolol Tartrate 100 Mg Tabs (Metoprolol tartrate) .... One half by mouth two times a day 2)  Nexium 40 Mg Cpdr (Esomeprazole magnesium) .... Once daily as needed 3)  Simvastatin 40 Mg Tabs (Simvastatin) .... 1/2 tab daily 4)  Niacin Cr 500 Mg Cr-caps (Niacin) .... Once daily 5)  Fish Oil Concentrate 1000 Mg Caps (Omega-3 fatty acids) .... Once daily 6)  Aspirin Ec 325 Mg Tbec (Aspirin) .... Take one tablet by mouth daily 7)  Hydrochlorothiazide 25 Mg Tabs (Hydrochlorothiazide) .... Once daily 8)  Lisinopril 10 Mg Tabs (Lisinopril) .... Once daily 9)  B Complex Tabs (B complex vitamins) .Marland Kitchen.. 1 tab by mouth once daily 10)  Multivitamins Tabs (Multiple vitamin) .Marland Kitchen.. 1 tab by mouth once daily 11)  Mucinex Fast-max Cold & Sinus 10-650-400 Mg/13ml Liqd (Phenylephrine-apap-guaifenesin) .... As needed  Hypertension Assessment/Plan:      The patient's hypertensive risk group is category B: At least one risk factor (excluding diabetes) with no target organ damage.  Her calculated 10 year risk of coronary heart disease is 15 %.  Today's blood pressure is 148/80.    Patient Instructions: 1)  Get plenty of rest, drink lots of clear liquids, and use Tylenol or Ibuprofen for fever and comfort. Return in 7-10 days if you're not better: sooner if you'er feeling worse.  Prescriptions: METOPROLOL TARTRATE 100 MG TABS (METOPROLOL TARTRATE) one half by mouth two times a day  #30 x 11   Entered and Authorized by:   Evelena Peat MD   Signed by:   Evelena Peat MD on 05/18/2010   Method used:   [facility] to        [facility] [phone]* (retail)       [address]       [place], [place]  [address]       Ph: [phone] or [phone]       Fax: [phone]   RxID:   [id number]    Orders Added: 1)  Est. Patient Level IV [88416]

## 2010-06-07 NOTE — Progress Notes (Signed)
Summary: Dysuria again, Rx request  Phone Note Call from Patient Call back at Home Phone [phone]   Caller: Patient----voicemail Summary of Call: Experiencing another kidney infection. please return call. Initial call taken by: Warnell Forester,  October 27, 2009 2:58 PM  Follow-up for Phone Call        Pt experiencing same symptoms as went he went to Urgent Care earlier this month, burning, dysuria.  What to do?  Pt requesting RX before it gets worse.  We do not have record of the meds they gave her, antibiotic & pain med.  Pt is calling Urgent Center to find out. Follow-up by: Sid Falcon LPN,  October 27, 2009 3:22 PM  Additional Follow-up for Phone Call Additional follow up Details #1::        OK to refill Macrobid one by mouth two times a day for 5 days and if symptoms not fully resolved with that will need follow up to reexamine to look at other possibilities. Additional Follow-up by: Evelena Peat MD,  October 27, 2009 4:16 PM    Additional Follow-up for Phone Call Additional follow up Details #2::    Pt informed Follow-up by: Sid Falcon LPN,  October 27, 2009 6:02 PM  New/Updated Medications: MACROBID 100 MG CAPS (NITROFURANTOIN MONOHYD MACRO) one tab two times a day X 5 days Prescriptions: MACROBID 100 MG CAPS (NITROFURANTOIN MONOHYD MACRO) one tab two times a day X 5 days  #10 x 0   Entered by:   Sid Falcon LPN   Authorized by:   Evelena Peat MD   Signed by:   Sid Falcon LPN on 09/81/1914   Method used:   [facility] to        [facility] [phone]* (retail)       [address]       [place], [place]  [address]       Ph: [phone] or [phone]       Fax: [phone]   RxID:   [id number]

## 2010-06-07 NOTE — Assessment & Plan Note (Signed)
Summary: fup bladder from urgent care//ccm   Vital Signs:  Patient profile:   70 year old female Weight:      167 pounds Temp:     97.9 degrees F oral BP sitting:   140 / 90  (left arm)  Vitals Entered By: Kathrynn Speed CMA (October 11, 2009 12:56 PM) CC: FU Bladder Infection          History of Present Illness: UTI treated at local urgent care with nitrofurantoin.  Urine symptoms did improve on antibiotics. No fever.  Denies abd pain, back pain, nausea, vomiting.  Here today to have urine rechecked.  Lipids recently done and at goal. Tolerating meds well.  Current Medications (verified): 1)  Toprol Xl 100 Mg Xr24h-Tab (Metoprolol Succinate) .... Once Daily 2)  Nexium 40 Mg Cpdr (Esomeprazole Magnesium) .... Once Daily As Needed 3)  Simvastatin 40 Mg Tabs (Simvastatin) .... Once Daily 4)  Niacin Cr 500 Mg Cr-Caps (Niacin) .... Once Daily 5)  Fish Oil Concentrate 1000 Mg Caps (Omega-3 Fatty Acids) .... Once Daily 6)  Adprin B 325 Mg Tabs (Aspirin Buf(Cacarb-Mgcarb-Mgo)) .... Once Daily 7)  Hydrochlorothiazide 25 Mg Tabs (Hydrochlorothiazide) .... Once Daily 8)  Lisinopril 10 Mg Tabs (Lisinopril) .... Once Daily  Allergies (verified): 1)  ! Codeine Sulfate (Codeine Sulfate) 2)  ! Antihistamine  Past History:  Past Medical History: Last updated: 08/09/2008 Current Problems:  HIATAL HERNIA (ICD-553.3) IRRITABLE BOWEL SYNDROME (ICD-564.1) DIVERTICULOSIS, COLON (ICD-562.10) DEPRESSION, CHRONIC (ICD-311) DEGENERATIVE JOINT DISEASE (ICD-715.90) HYPERCHOLESTEROLEMIA (ICD-272.0) HYPERTENSION (ICD-401.9) GERD Headache Hay fever, allergies PMH reviewed for relevance  Review of Systems  The patient denies fever, hematuria, and incontinence.    Physical Exam  General:  Well-developed,well-nourished,in no acute distress; alert,appropriate and cooperative throughout examination Mouth:  Oral mucosa and oropharynx without lesions or exudates.  Teeth in good  repair. Neck:  No deformities, masses, or tenderness noted. Lungs:  Normal respiratory effort, chest expands symmetrically. Lungs are clear to auscultation, no crackles or wheezes. Heart:  Normal rate and regular rhythm. S1 and S2 normal without gallop, murmur, click, rub or other extra sounds. Abdomen:  soft, non-tender, no masses, no hepatomegaly, and no splenomegaly.   Msk:  no CVA tenderness.   Impression & Recommendations:  Problem # 1:  DYSURIA (ICD-788.1) Assessment New  repeat UA today per pt request though symptoms are improved.  Orders: UA Dipstick w/o Micro (manual) (16109) T-Culture, Urine ([id number])  Problem # 2:  HYPERCHOLESTEROLEMIA (ICD-272.0) Assessment: Unchanged  Her updated medication list for this problem includes:    Simvastatin 40 Mg Tabs (Simvastatin) ..... Once daily    Niacin Cr 500 Mg Cr-caps (Niacin) ..... Once daily  Complete Medication List: 1)  Toprol Xl 100 Mg Xr24h-tab (Metoprolol succinate) .... Once daily 2)  Nexium 40 Mg Cpdr (Esomeprazole magnesium) .... Once daily as needed 3)  Simvastatin 40 Mg Tabs (Simvastatin) .... Once daily 4)  Niacin Cr 500 Mg Cr-caps (Niacin) .... Once daily 5)  Fish Oil Concentrate 1000 Mg Caps (Omega-3 fatty acids) .... Once daily 6)  Adprin B 325 Mg Tabs (Aspirin buf(cacarb-mgcarb-mgo)) .... Once daily 7)  Hydrochlorothiazide 25 Mg Tabs (Hydrochlorothiazide) .... Once daily 8)  Lisinopril 10 Mg Tabs (Lisinopril) .... Once daily  Patient Instructions: 1)  Drinks plenty of fluids. 2)  To to consume no more than 2,000 to 2,500 mg/day of sodium

## 2010-06-07 NOTE — Assessment & Plan Note (Signed)
Summary: eph/chest pains/mt  Medications Added SIMVASTATIN 20 MG TABS (SIMVASTATIN) Take one tablet by mouth daily at bedtime ASPIRIN EC 325 MG TBEC (ASPIRIN) Take one tablet by mouth daily B COMPLEX  TABS (B COMPLEX VITAMINS) 1 tab by mouth once daily MULTIVITAMINS   TABS (MULTIPLE VITAMIN) 1 tab by mouth once daily      Allergies Added:   CC:  chest pain pt thinks its related to acid reflux .  History of Present Illness: Referred by ER and [facility] for SSCP.  Last couple of months.  Atypical  Not always exertional.  Sharp pain that can radiate to neck.  Seen in ER 9/24 with R/O and normal ECG.  Previous ETT years ago was normal.  CRF's Hypertension and elevated lipids well treated.  Walks and does elliptical on regular basis with no difficulty.  No associated dyspnea, occasional palpitaitons but no syncpoe, or edema.  Has been fatigued.  Recent UTI Rx with antibiotics  Current Problems (verified): 1)  Chest Pain  (ICD-786.50) 2)  Hypercholesterolemia  (ICD-272.0) 3)  Hypertension  (ICD-401.9) 4)  Dysuria  (ICD-788.1) 5)  Tmj Syndrome  (ICD-524.60) 6)  Headache  (ICD-784.0) 7)  Gerd  (ICD-530.81) 8)  Tick Bite  (ICD-E906.4) 9)  Rhinitis  (ICD-472.0) 10)  Hiatal Hernia  (ICD-553.3) 11)  Irritable Bowel Syndrome  (ICD-564.1) 12)  Diverticulosis, Colon  (ICD-562.10) 13)  Depression, Chronic  (ICD-311) 14)  Degenerative Joint Disease  (ICD-715.90)  Current Medications (verified): 1)  Toprol Xl 100 Mg Xr24h-Tab (Metoprolol Succinate) .... Once Daily 2)  Nexium 40 Mg Cpdr (Esomeprazole Magnesium) .... Once Daily As Needed 3)  Simvastatin 20 Mg Tabs (Simvastatin) .... Take One Tablet By Mouth Daily At Bedtime 4)  Niacin Cr 500 Mg Cr-Caps (Niacin) .... Once Daily 5)  Fish Oil Concentrate 1000 Mg Caps (Omega-3 Fatty Acids) .... Once Daily 6)  Aspirin Ec 325 Mg Tbec (Aspirin) .... Take One Tablet By Mouth Daily 7)  Hydrochlorothiazide 25 Mg Tabs (Hydrochlorothiazide) .... Once  Daily 8)  Lisinopril 10 Mg Tabs (Lisinopril) .... Once Daily 9)  B Complex  Tabs (B Complex Vitamins) .Marland Kitchen.. 1 Tab By Mouth Once Daily 10)  Multivitamins   Tabs (Multiple Vitamin) .Marland Kitchen.. 1 Tab By Mouth Once Daily  Allergies (verified): 1)  ! Codeine Sulfate (Codeine Sulfate) 2)  ! Antihistamine  Past History:  Past Medical History: Last updated: 01/31/2010 Chest Pains: Seen in ER 01/27/10 Discussed with [facility]  outpatient F/U and stress test needed HYPERCHOLESTEROLEMIA  HYPERTENSION DYSURIA TMJ SYNDROME  HEADACHE  GERD  TICK BITE  RHINITIS HIATAL HERNIA  IRRITABLE BOWEL SYNDROME  DIVERTICULOSIS, COLON  DEPRESSION, CHRONIC  DEGENERATIVE JOINT DISEASE Hay fever, allergies  Past Surgical History: Last updated: 08/09/2008 Appendectomy Hysterectomy 1998 Breast surgery/ milk gland Benign tumor, ovary 1969 Lung surgery, scar tissue, 1983  Family History: Last updated: 08/09/2008 Family History of Arthritis parent Family History High cholesterol  parent Family History Hypertension  parent Family History of Cardiovascular disorder  parent, grandparent Stroke parent Diabetes  sibling Alcohol, drug problem sibling  Social History: Last updated: 10/20/2008 Retired Widow/Widower Never Smoked  Review of Systems       Denies fever, malais, weight loss, blurry vision, decreased visual acuity, cough, sputum, SOB, hemoptysis, pleuritic pain,  heartburn, abdominal pain, melena, lower extremity edema, claudication, or rash.   Vital Signs:  Patient profile:   70 year old female Height:      62 inches Weight:      166 pounds BMI:  30.47 Pulse rate:   69 / minute Resp:     14 per minute BP sitting:   150 / 79  (left arm)  Vitals Entered By: Kem Parkinson (February 01, 2010 9:10 AM)  Physical Exam  General:  Affect appropriate Healthy:  appears stated age HEENT: normal Neck supple with no adenopathy JVP normal no bruits no thyromegaly Lungs clear with no wheezing  and good diaphragmatic motion Heart:  S1/S2 no murmur,rub, gallop or click PMI normal Abdomen: benighn, BS positve, no tenderness, no AAA no bruit.  No HSM or HJR Distal pulses intact with no bruits No edema Neuro non-focal Skin warm and dry    Impression & Recommendations:  Problem # 1:  CHEST PAIN (ICD-786.50) Atypical with normal ECG  F/U stess echo Her updated medication list for this problem includes:    Toprol Xl 100 Mg Xr24h-tab (Metoprolol succinate) ..... Once daily    Aspirin Ec 325 Mg Tbec (Aspirin) .Marland Kitchen... Take one tablet by mouth daily    Lisinopril 10 Mg Tabs (Lisinopril) ..... Once daily  Orders: Stress Echo (Stress Echo)  Problem # 2:  HYPERCHOLESTEROLEMIA (ICD-272.0) At goal with no vascular disease known Her updated medication list for this problem includes:    Simvastatin 20 Mg Tabs (Simvastatin) .Marland Kitchen... Take one tablet by mouth daily at bedtime    Niacin Cr 500 Mg Cr-caps (Niacin) ..... Once daily  CHOL: 144 (09/05/2009)   LDL: 85 (09/05/2009)   HDL: 48.90 (09/05/2009)   TG: 53.0 (09/05/2009) CHOL (goal): 200 (05/11/2009)   LDL (goal): 130 (05/11/2009)   HDL (goal): 40 (05/11/2009)   TG (goal): 150 (05/11/2009)  Problem # 3:  HYPERTENSION (ICD-401.9) Well controlled continue low sodium diet Her updated medication list for this problem includes:    Toprol Xl 100 Mg Xr24h-tab (Metoprolol succinate) ..... Once daily    Aspirin Ec 325 Mg Tbec (Aspirin) .Marland Kitchen... Take one tablet by mouth daily    Hydrochlorothiazide 25 Mg Tabs (Hydrochlorothiazide) ..... Once daily    Lisinopril 10 Mg Tabs (Lisinopril) ..... Once daily  Problem # 4:  PALPITATIONS (ICD-785.1) Benign no need for further w/u Her updated medication list for this problem includes:    Toprol Xl 100 Mg Xr24h-tab (Metoprolol succinate) ..... Once daily    Aspirin Ec 325 Mg Tbec (Aspirin) .Marland Kitchen... Take one tablet by mouth daily    Lisinopril 10 Mg Tabs (Lisinopril) ..... Once daily  Patient  Instructions: 1)  Your physician recommends that you schedule a follow-up appointment in: as needed 2)  Your physician has requested that you have a stress echocardiogram. For further information please visit [email or web address]  Please follow instruction sheet as given.   EKG Report  Procedure date:  02/01/2010  Findings:      NSR 69 Normal ECG

## 2010-06-07 NOTE — Assessment & Plan Note (Signed)
Summary: 4 month rov/njr---PT RSC (BMP) // RS   Vital Signs:  Patient profile:   70 year old female Weight:      165 pounds Temp:     98.5 degrees F oral BP sitting:   142 / 70  (left arm) Cuff size:   regular  Vitals Entered By: Sid Falcon LPN (Sep 15, 2009 10:22 AM) CC: 4 month follow-up, Hypertension Management, Lipid Management   History of Present Illness: Followup several items as below.  Hypertension. Compliant with medications. Good control by home readings. No orthostatic symptoms.  Hyperlipidemia. Recent lipids reviewed with patient and excellent control. Taking one half tablet of simvastatin daily. No myalgias.  Acute problem of tick bite right lower abdomen 2 weeks ago. This was a deer tick. No rashes, headache, fever, or chills.  Hypertension History:      She denies headache, chest pain, palpitations, dyspnea with exertion, orthopnea, PND, peripheral edema, visual symptoms, neurologic problems, syncope, and side effects from treatment.  She notes no problems with any antihypertensive medication side effects.        Positive major cardiovascular risk factors include female age 62 years old or older, hyperlipidemia, and hypertension.  Negative major cardiovascular risk factors include no history of diabetes, negative family history for ischemic heart disease, and non-tobacco-user status.        Further assessment for target organ damage reveals no history of ASHD, stroke/TIA, or peripheral vascular disease.    Lipid Management History:      Positive NCEP/ATP III risk factors include female age 91 years old or older and hypertension.  Negative NCEP/ATP III risk factors include no history of early menopause without estrogen hormone replacement, non-diabetic, no family history for ischemic heart disease, non-tobacco-user status, no ASHD (atherosclerotic heart disease), no prior stroke/TIA, no peripheral vascular disease, and no history of aortic aneurysm.       Allergies: 1)  ! Codeine Sulfate (Codeine Sulfate) 2)  ! Antihistamine  Past History:  Past Medical History: Last updated: 08/09/2008 Current Problems:  HIATAL HERNIA (ICD-553.3) IRRITABLE BOWEL SYNDROME (ICD-564.1) DIVERTICULOSIS, COLON (ICD-562.10) DEPRESSION, CHRONIC (ICD-311) DEGENERATIVE JOINT DISEASE (ICD-715.90) HYPERCHOLESTEROLEMIA (ICD-272.0) HYPERTENSION (ICD-401.9) GERD Headache Hay fever, allergies  Past Surgical History: Last updated: 08/09/2008 Appendectomy Hysterectomy 1998 Breast surgery/ milk gland Benign tumor, ovary 1969 Lung surgery, scar tissue, 1983  Social History: Last updated: 10/20/2008 Retired Widow/Widower Never Smoked PMH reviewed for relevance, SH/Risk Factors reviewed for relevance  Review of Systems  The patient denies anorexia, fever, chest pain, syncope, dyspnea on exertion, peripheral edema, and abdominal pain.    Physical Exam  General:  Well-developed,well-nourished,in no acute distress; alert,appropriate and cooperative throughout examination Head:  Normocephalic and atraumatic without obvious abnormalities. No apparent alopecia or balding. Eyes:  No corneal or conjunctival inflammation noted. EOMI. Perrla. Funduscopic exam benign, without hemorrhages, exudates or papilledema. Vision grossly normal. Ears:  External ear exam shows no significant lesions or deformities.  Otoscopic examination reveals clear canals, tympanic membranes are intact bilaterally without bulging, retraction, inflammation or discharge. Hearing is grossly normal bilaterally. Mouth:  Oral mucosa and oropharynx without lesions or exudates.  Teeth in good repair. Neck:  No deformities, masses, or tenderness noted. Lungs:  Normal respiratory effort, chest expands symmetrically. Lungs are clear to auscultation, no crackles or wheezes. Heart:  normal rate and regular rhythm.   Skin:  small punctate area right lower abdomen from previous tick bite. No  rashes. Nontender.   Impression & Recommendations:  Problem # 1:  TICK BITE (ICD-E906.4)  observe and prompt f/u for any new rashes or fever.  Problem # 2:  HYPERCHOLESTEROLEMIA (ICD-272.0)  Her updated medication list for this problem includes:    Simvastatin 40 Mg Tabs (Simvastatin) ..... Once daily    Niacin Cr 500 Mg Cr-caps (Niacin) ..... Once daily  Problem # 3:  HYPERTENSION (ICD-401.9) Assessment: Unchanged  Her updated medication list for this problem includes:    Toprol Xl 100 Mg Xr24h-tab (Metoprolol succinate) ..... Once daily    Hydrochlorothiazide 25 Mg Tabs (Hydrochlorothiazide) ..... Once daily    Lisinopril 10 Mg Tabs (Lisinopril) ..... Once daily  Complete Medication List: 1)  Toprol Xl 100 Mg Xr24h-tab (Metoprolol succinate) .... Once daily 2)  Nexium 40 Mg Cpdr (Esomeprazole magnesium) .... Once daily as needed 3)  Simvastatin 40 Mg Tabs (Simvastatin) .... Once daily 4)  Niacin Cr 500 Mg Cr-caps (Niacin) .... Once daily 5)  Fish Oil Concentrate 1000 Mg Caps (Omega-3 fatty acids) .... Once daily 6)  Adprin B 325 Mg Tabs (Aspirin buf(cacarb-mgcarb-mgo)) .... Once daily 7)  Hydrochlorothiazide 25 Mg Tabs (Hydrochlorothiazide) .... Once daily 8)  Lisinopril 10 Mg Tabs (Lisinopril) .... Once daily  Hypertension Assessment/Plan:      The patient's hypertensive risk group is category B: At least one risk factor (excluding diabetes) with no target organ damage.  Her calculated 10 year risk of coronary heart disease is 11 %.  Today's blood pressure is 142/70.    Lipid Assessment/Plan:      Based on NCEP/ATP III, the patient's risk factor category is "0-1 risk factors".  The patient's lipid goals are as follows: Total cholesterol goal is 200; LDL cholesterol goal is 130; HDL cholesterol goal is 40; Triglyceride goal is 150.    Patient Instructions: 1)  Please schedule a follow-up appointment in 6 months .  2)  It is important that you exercise reguarly at least 20  minutes 5 times a week. If you develop chest pain, have severe difficulty breathing, or feel very tired, stop exercising immediately and seek medical attention.  3)  Check your  Blood Pressure regularly . If it is above:140/90   you should make an appointment.

## 2010-06-07 NOTE — Progress Notes (Signed)
Summary: another bladder inf  Phone Note Call from Patient Call back at Home Phone [phone]   Caller: Patient Call For: Evelena Peat MD Summary of Call: pt has another bladder inf (blood in urine) needs other round of abx cipro 250mg  . Please call Radene Ou [phone]. Initial call taken by: Heron Sabins,  February 01, 2010 12:40 PM  Follow-up for Phone Call        may refill once but pt needs office follow up with repeat UA then in 2 weeks to make sure hematuria has cleared. Follow-up by: Evelena Peat MD,  February 01, 2010 12:49 PM  Additional Follow-up for Phone Call Additional follow up Details #1::        Pt informed and she voiced her understanding Additional Follow-up by: Sid Falcon LPN,  February 01, 2010 1:46 PM    New/Updated Medications: CIPRO 250 MG TABS (CIPROFLOXACIN HCL) one tab two times a day Prescriptions: CIPRO 250 MG TABS (CIPROFLOXACIN HCL) one tab two times a day  #14 x 0   Entered by:   Sid Falcon LPN   Authorized by:   Evelena Peat MD   Signed by:   Sid Falcon LPN on 62/13/0865   Method used:   Electronically to        [facility] [phone]* (retail)       [address]       [place], [place]  [address]       Ph: [phone] or [phone]       Fax: [phone]   RxID:   [id number]

## 2010-06-07 NOTE — Letter (Signed)
Summary: EGD Instructions  [facility]  [address] [place], [place] [address]   Phone: [phone]  Fax: [phone]       Wendy Sandoval    07/03/1940    MRN: [id number]       Procedure Day Dorna Bloom: Wednesday 05/23/2010     Arrival Time: 10am     Procedure Time: 11am     Location of Procedure:                    [facility] (4th Floor)  PREPARATION FOR ENDOSCOPY  On 05/23/2010 THE DAY OF THE PROCEDURE:  1.   No solid foods, milk or milk products are allowed after midnight the night before your procedure.  2.   Do not drink anything colored red or purple.  Avoid juices with pulp.  No orange juice.  3.  You may drink clear liquids until 9am, which is 2 hours before your procedure.                                                                                                CLEAR LIQUIDS INCLUDE: Water Jello Ice Popsicles Tea (sugar ok, no milk/cream) Powdered fruit flavored drinks Coffee (sugar ok, no milk/cream) Gatorade Juice: apple, white grape, white cranberry  Lemonade Clear bullion, consomm, broth Carbonated beverages (any kind) Strained chicken noodle soup Hard Candy   MEDICATION INSTRUCTIONS  Unless otherwise instructed, you should take regular prescription medications with a small sip of water as early as possible the morning of your procedure.       OTHER INSTRUCTIONS  You will need a responsible adult at least 72 years of age to accompany you and drive you home.   This person must remain in the waiting room during your procedure.  Wear loose fitting clothing that is easily removed.  Leave jewelry and other valuables at home.  However, you may wish to bring a book to read or an iPod/MP3 player to listen to music as you wait for your procedure to start.  Remove all body piercing jewelry and leave at home.  Total time from sign-in until discharge is approximately 2-3 hours.  You should go home directly after your  procedure and rest.  You can resume normal activities the day after your procedure.  The day of your procedure you should not:   Drive   Make legal decisions   Operate machinery   Drink alcohol   Return to work  You will receive specific instructions about eating, activities and medications before you leave.    The above instructions have been reviewed and explained to me by   _______________________    I fully understand and can verbalize these instructions _____________________________ Date _________

## 2010-06-07 NOTE — Assessment & Plan Note (Signed)
Summary: discuss EGD--ch.    History of Present Illness Visit Type: new patient  Primary GI MD: Sheryn Bison MD FACP FAGA Primary Provider: Evelena Peat, MD  Requesting Provider: na Chief Complaint: Consult EGD and pt c/o GERD History of Present Illness:   70 year old Caucasian female with chronic intestinal adhesions and suspected right upper quadrant pain her previous GI evaluations. She continues with vague right upper quadrant discomfort but has normal bowel movements without melena or hematochezia. There is no history of any specific hepatobiliary complaints.  Despite daily Nexium, she continues with some acid reflux symptoms and occasional solid food dysphagia. Last endoscopy was in 2003. She is followed by Dr. [name] and has had normal liver enzymes. He continues with mild depression related to the death of her husband.   GI Review of Systems    Reports acid reflux and  heartburn.      Denies abdominal pain, belching, bloating, chest pain, dysphagia with liquids, dysphagia with solids, loss of appetite, nausea, vomiting, vomiting blood, weight loss, and  weight gain.      Reports diverticulosis.     Denies anal fissure, black tarry stools, change in bowel habit, constipation, diarrhea, fecal incontinence, heme positive stool, hemorrhoids, irritable bowel syndrome, jaundice, light color stool, liver problems, rectal bleeding, and  rectal pain.    Current Medications (verified): 1)  Toprol Xl 100 Mg Xr24h-Tab (Metoprolol Succinate) .... Once Daily 2)  Nexium 40 Mg Cpdr (Esomeprazole Magnesium) .... Once Daily As Needed 3)  Simvastatin 40 Mg Tabs (Simvastatin) .... 1/2 Tab Daily 4)  Niacin Cr 500 Mg Cr-Caps (Niacin) .... Once Daily 5)  Fish Oil Concentrate 1000 Mg Caps (Omega-3 Fatty Acids) .... Once Daily 6)  Aspirin Ec 325 Mg Tbec (Aspirin) .... Take One Tablet By Mouth Daily 7)  Hydrochlorothiazide 25 Mg Tabs (Hydrochlorothiazide) .... Once Daily 8)  Lisinopril 10 Mg  Tabs (Lisinopril) .... Once Daily 9)  B Complex  Tabs (B Complex Vitamins) .Marland Kitchen.. 1 Tab By Mouth Once Daily 10)  Multivitamins   Tabs (Multiple Vitamin) .Marland Kitchen.. 1 Tab By Mouth Once Daily 11)  Mucinex Fast-Max Cold & Sinus 10-650-400 Mg/15ml Liqd (Phenylephrine-Apap-Guaifenesin) .... As Needed  Allergies (verified): 1)  ! Codeine Sulfate (Codeine Sulfate) 2)  ! Antihistamine  Past History:  Past medical, surgical, family and social histories (including risk factors) reviewed for relevance to current acute and chronic problems.  Past Medical History: Chest Pains: Seen in ER 01/27/10 Discussed with [facility]  outpatient F/U and stress test needed HYPERCHOLESTEROLEMIA  HYPERTENSION DYSURIA TMJ SYNDROME  HEADACHE  GERD  TICK BITE  RHINITIS HIATAL HERNIA  IRRITABLE BOWEL SYNDROME  DIVERTICULOSIS, COLON  DEPRESSION, CHRONIC  DEGENERATIVE JOINT DISEASE Hay fever, allergies Esophageal Stricture Hx of UTI   Past Surgical History: Reviewed history from 08/09/2008 and no changes required. Appendectomy Hysterectomy 1998 Breast surgery/ milk gland Benign tumor, ovary 1969 Lung surgery, scar tissue, 1983  Family History: Reviewed history from 08/09/2008 and no changes required. Family History of Arthritis parent Family History High cholesterol  parent Family History Hypertension  parent Family History of Cardiovascular disorder  parent, grandparent Stroke parent Diabetes  sibling Alcohol, drug problem sibling No FH of Colon Cancer:  Social History: Reviewed history from 10/20/2008 and no changes required. Retired Building control surveyor Patient currently smokes: Occ or once in awhile  Alcohol Use - no Daily Caffeine Use 3-4 daily  Illicit Drug Use - no Smoking Status:  current Drug Use:  no  Review of Systems  The patient complains of allergy/sinus, arthritis/joint pain, back pain, blood in urine, cough, depression-new, fatigue, sleeping problems, swelling of feet/legs, and  urination changes/pain.  The patient denies anemia, anxiety-new, breast changes/lumps, change in vision, confusion, coughing up blood, fainting, fever, headaches-new, hearing problems, heart murmur, heart rhythm changes, itching, menstrual pain, muscle pains/cramps, night sweats, nosebleeds, pregnancy symptoms, shortness of breath, skin rash, sore throat, swollen lymph glands, thirst - excessive , urination - excessive , urine leakage, vision changes, and voice change.    Vital Signs:  Patient profile:   70 year old female Height:      62 inches Weight:      168 pounds BMI:     30.84 BSA:     1.78 Pulse rate:   68 / minute Pulse rhythm:   regular BP sitting:   128 / 80  (left arm) Cuff size:   regular  Vitals Entered By: Ok Anis CMA (May 15, 2010 10:09 AM)  Physical Exam  General:  Well developed, well nourished, no acute distress.healthy appearing.   Head:  Normocephalic and atraumatic. Eyes:  PERRLA, no icterus.exam deferred to patient's ophthalmologist.   Neck:  Supple; no masses or thyromegaly. Lungs:  Clear throughout to auscultation. Heart:  Regular rate and rhythm; no murmurs, rubs,  or bruits. Abdomen:  Soft, nontender and nondistended. No masses, hepatosplenomegaly or hernias noted. Normal bowel sounds. Msk:  Symmetrical with no gross deformities. Normal posture. Pulses:  Normal pulses noted. Extremities:  No clubbing, cyanosis, edema or deformities noted.trace pedal edema.   Neurologic:  Alert and  oriented x4;  grossly normal neurologically. Cervical Nodes:  No significant cervical adenopathy. Psych:  Alert and cooperative. Normal mood and affect.   Impression & Recommendations:  Problem # 1:  RUQ PAIN (ICD-789.01) Assessment Unchanged Repeat upper abdominal ultrasound exam. I suspect she does have mild fatty infiltration of her liver with capsular distention, also an element of chronic intestinal adhesions possibly involving the subcutaneous surface of her  liver area. Orders: TLB-B12, Serum-Total ONLY (41324-M01) TLB-Ferritin (82728-FER) TLB-Folic Acid (Folate) (82746-FOL) TLB-IBC Pnl (Iron/FE;Transferrin) (83550-IBC) TLB-CBC Platelet - w/Differential (85025-CBCD) TLB-Magnesium (Mg) (83735-MG) EGD (EGD) Ultrasound Abdomen (UAS)  Problem # 2:  ESOPHAGEAL STRICTURE (ICD-530.3) Assessment: Deteriorated Repeat endoscopy and possible esophageal dilatation scheduled at her convenience. I have urged her to take her Nexium Daily(she takes it p.r.n. currently] and patient education video on GERD visualized by the patient and anti-reflex maneuvers reviewed.  Problem # 3:  IRRITABLE BOWEL SYNDROME (ICD-564.1) Assessment: Improved Continue high-fiber diet as tolerated with liberal p.o. fluids. She does have diverticulosis confirmed by colonoscopy, and is up-to-date on her colonoscopy exams.  Patient Instructions: 1)  Copy sent to : Evelena Peat, MD  2)  Your procedure has been scheduled for 05/23/2010, please follow the seperate instructions.  3)  Your Ultrasound is scheduled, please follow the seperate instructions.  4)   Endoscopy Center Patient Information Guide given to patient.  5)  Upper Endoscopy brochure given.  6)  Today you watched the [employer] in the office.  7)  Please go to the basement today for your labs.  8)  Your prescription(s) have been sent to you pharmacy.  9)  Avoid foods high in acid content ( tomatoes, citrus juices, spicy foods) . Avoid eating within 3 to 4 hours of lying down or before exercising. Do not over eat; try smaller more frequent meals. Elevate head of bed four inches when sleeping.  10)  GI Reflux brochure given.  11)  The medication list was reviewed and reconciled.  All changed / newly prescribed medications were explained.  A complete medication list was provided to the patient / caregiver. 12)  Please continue current medications.  13)  Avoid foods high in acid content ( tomatoes, citrus juices,  spicy foods) . Avoid eating within 3 to 4 hours of lying down or before exercising. Do not over eat; try smaller more frequent meals. Elevate head of bed four inches when sleeping.  14)  High Fiber, Low Fat  Healthy Eating Plan brochure given.

## 2010-06-07 NOTE — Assessment & Plan Note (Signed)
Summary: follow up on bladder inf/cjr   Vital Signs:  Patient profile:   70 year old female Weight:      166 pounds Temp:     98.7 degrees F oral BP sitting:   120 / 70  (left arm) Cuff size:   regular  Vitals Entered By: Sid Falcon LPN (February 13, 2010 11:26 AM)  History of Present Illness: Recent discolored urine and pain with urination 2 weeks ago. Cipro called in .  No burning now.  Similar symptoms in June with neg cx then. No gross hematuria since 2 weeks ago.  Asymtomatic now.  Frequent fatigue issues.  No recent TSH.  Some ankle edema. Occ constipation.  Some cold intolerance.  Weight stable. She thinks some if this might be related to stress of caring for her elderly mother. Poor sleep at times.  Denies signif depression symptoms. Recent stress ECHO no ischemia.  Allergies: 1)  ! Codeine Sulfate (Codeine Sulfate) 2)  ! Antihistamine  Past History:  Past Medical History: Last updated: 01/31/2010 Chest Pains: Seen in ER 01/27/10 Discussed with [facility]  outpatient F/U and stress test needed HYPERCHOLESTEROLEMIA  HYPERTENSION DYSURIA TMJ SYNDROME  HEADACHE  GERD  TICK BITE  RHINITIS HIATAL HERNIA  IRRITABLE BOWEL SYNDROME  DIVERTICULOSIS, COLON  DEPRESSION, CHRONIC  DEGENERATIVE JOINT DISEASE Hay fever, allergies  Past Surgical History: Last updated: 08/09/2008 Appendectomy Hysterectomy 1998 Breast surgery/ milk gland Benign tumor, ovary 1969 Lung surgery, scar tissue, 1983  Family History: Last updated: 08/09/2008 Family History of Arthritis parent Family History High cholesterol  parent Family History Hypertension  parent Family History of Cardiovascular disorder  parent, grandparent Stroke parent Diabetes  sibling Alcohol, drug problem sibling  Social History: Last updated: 10/20/2008 Retired Widow/Widower Never Smoked  Risk Factors: Smoking Status: never (10/20/2008) PMH-FH-SH reviewed for relevance  Review of Systems  The patient  denies anorexia, fever, weight loss, chest pain, syncope, dyspnea on exertion, peripheral edema, prolonged cough, headaches, abdominal pain, melena, hematochezia, incontinence, muscle weakness, suspicious skin lesions, depression, and enlarged lymph nodes.    Physical Exam  General:  Well-developed,well-nourished,in no acute distress; alert,appropriate and cooperative throughout examination Head:  Normocephalic and atraumatic without obvious abnormalities. No apparent alopecia or balding. Eyes:  pupils equal, pupils round, and pupils reactive to light.   Ears:  External ear exam shows no significant lesions or deformities.  Otoscopic examination reveals clear canals, tympanic membranes are intact bilaterally without bulging, retraction, inflammation or discharge. Hearing is grossly normal bilaterally. Mouth:  Oral mucosa and oropharynx without lesions or exudates.  Teeth in good repair. Neck:  No deformities, masses, or tenderness noted. Lungs:  Normal respiratory effort, chest expands symmetrically. Lungs are clear to auscultation, no crackles or wheezes. Heart:  Normal rate and regular rhythm. S1 and S2 normal without gallop, murmur, click, rub or other extra sounds. Extremities:  no pitting edema. Neurologic:  alert & oriented X3, cranial nerves II-XII intact, and strength normal in all extremities.   Skin:  no rashes and no suspicious lesions.   Cervical Nodes:  No lymphadenopathy noted. Psych:  normally interactive, good eye contact, not anxious appearing, and not depressed appearing.     Impression & Recommendations:  Problem # 1:  DYSURIA (ICD-788.1) repeat urine cx and start antibiotic pending urine cx.  NO hematuria today.  Problem # 2:  FATIGUE (ICD-780.79) check TSH.  Suspect partly related to stress. Orders: T-Culture, Urine (04540-98119) Venipuncture (14782) Specimen Handling (95621) UA Dipstick w/o Micro (automated)  (81003) TLB-TSH (Thyroid  Stimulating Hormone)  (84443-TSH)  Problem # 3:  CHEST PAIN (ICD-786.50) recent stress ECHO reviewed with pt and no ischemia noted. Symptoms stable.  Complete Medication List: 1)  Toprol Xl 100 Mg Xr24h-tab (Metoprolol succinate) .... Once daily 2)  Nexium 40 Mg Cpdr (Esomeprazole magnesium) .... Once daily as needed 3)  Simvastatin 20 Mg Tabs (Simvastatin) .... Take one tablet by mouth daily at bedtime 4)  Niacin Cr 500 Mg Cr-caps (Niacin) .... Once daily 5)  Fish Oil Concentrate 1000 Mg Caps (Omega-3 fatty acids) .... Once daily 6)  Aspirin Ec 325 Mg Tbec (Aspirin) .... Take one tablet by mouth daily 7)  Hydrochlorothiazide 25 Mg Tabs (Hydrochlorothiazide) .... Once daily 8)  Lisinopril 10 Mg Tabs (Lisinopril) .... Once daily 9)  B Complex Tabs (B complex vitamins) .Marland Kitchen.. 1 tab by mouth once daily 10)  Multivitamins Tabs (Multiple vitamin) .Marland Kitchen.. 1 tab by mouth once daily  Patient Instructions: 1)  Please schedule a follow-up appointment in 6 months .   Laboratory Results   Urine Tests    Routine Urinalysis   Color: yellow Appearance: Clear Glucose: negative   (Normal Range: Negative) Bilirubin: negative   (Normal Range: Negative) Ketone: negative   (Normal Range: Negative) Spec. Gravity: 1.010   (Normal Range: 1.003-1.035) Blood: negative   (Normal Range: Negative) pH: 7.0   (Normal Range: 5.0-8.0) Protein: negative   (Normal Range: Negative) Urobilinogen: 0.2   (Normal Range: 0-1) Nitrite: negative   (Normal Range: Negative) Leukocyte Esterace: 2+   (Normal Range: Negative)    Comments: Wendy Sandoval  February 13, 2010 11:18 AM

## 2010-06-07 NOTE — Procedures (Addendum)
Summary: Upper Endoscopy  Patient: Wendy Sandoval Note: All result statuses are Final unless otherwise noted.  Tests: (1) Upper Endoscopy (EGD)   EGD Upper Endoscopy       DONE     [facility]     [address]     [place], [place]  [address]           ENDOSCOPY PROCEDURE REPORT           PATIENT:  Wendy, Sandoval  MR#:  [id number]     BIRTHDATE:  [birth year], 69 yrs. old  GENDER:  female           ENDOSCOPIST:  Vania Rea. Jarold Motto, MD, [facility]     Referred by:           PROCEDURE DATE:  05/23/2010     PROCEDURE:  EGD with biopsy, 43239, Maloney Dilation of Esophagus     ASA CLASS:  Class II     INDICATIONS:  abdominal pain, dysphagia FATTY LIVER           MEDICATIONS:   Fentanyl 50 mcg IV, Versed 4 mg IV     TOPICAL ANESTHETIC:           DESCRIPTION OF PROCEDURE:   After the risks benefits and     alternatives of the procedure were thoroughly explained, informed     consent was obtained.  The [facility] GIF-H180 [id number] endoscope was     introduced through the mouth and advanced to the second portion of     the duodenum, without limitations.  The instrument was slowly     withdrawn as the mucosa was fully examined.     <<PROCEDUREIMAGES>>           A Schatzki's ring was found in the distal esophagus. DILATED #69F     MALONEY DILATOR.NO HEME OR PAIN.  Normal duodenal folds were     noted.  The stomach was entered and closely examined. The antrum,     angularis, and lesser curvature were well visualized, including a     retroflexed view of the cardia and fundus. The stomach wall was     normally distensable. The scope passed easily through the pylorus     into the duodenum. CLO BX DONE.  The esophagus and     gastroesophageal junction were completely normal in appearance.     Retroflexed views revealed no abnormalities.    The scope was then     withdrawn from the patient and the procedure completed.           COMPLICATIONS:  None           ENDOSCOPIC IMPRESSION:     1)  Schatzki's ring in the distal esophagus     2) Normal duodenal folds     3) Normal stomach     4) Normal esophagus     1.GERD     2.SCHATZSKI'S RING DILATED     RECOMMENDATIONS:     1) Await biopsy results     2) Clear liquids until, then soft foods rest iof day. Resume     prior diet tomorrow.     3) continue current medications           REPEAT EXAM:  No           ______________________________     Vania Rea. Jarold Motto, MD, [facility]           CC:  n.     eSIGNED:   Vania Rea. Patterson at 05/23/2010 12:14 PM           Durenda Hurt, [id number]  Note: An exclamation mark (!) indicates a result that was not dispersed into the flowsheet. Document Creation Date: 05/23/2010 12:14 PM _______________________________________________________________________  (1) Order result status: Final Collection or observation date-time: 05/23/2010 12:08 Requested date-time:  Receipt date-time:  Reported date-time:  Referring Physician:   Ordering Physician: Sheryn Bison [phone]) Specimen Source:  Source: Launa Grill Order Number: [id number] Lab site:

## 2010-06-07 NOTE — Miscellaneous (Signed)
Summary: Clotest  Clinical Lists Changes  Orders: Added new Test order of TLB-H Pylori Screen Gastric Biopsy (83013-CLOTEST) - Signed 

## 2010-06-07 NOTE — Letter (Signed)
Summary: New Patient letter  [facility]  [address] [place], [place] [address]   Phone: [phone]  Fax: [phone]       03/23/2010 MRN: [id number]  [facility] [address] [place], [place]  [address]  Dear Ms. How,  Welcome to the Gastroenterology Division at [facility].    You are scheduled to see Dr.  Jarold Motto on 05-15-10 at 10:00a.m. on the 3rd floor at [facility], [address]  We ask that you try to arrive at our office 15 minutes prior to your appointment time to allow for check-in.  We would like you to complete the enclosed self-administered evaluation form prior to your visit and bring it with you on the day of your appointment.  We will review it with you.  Also, please bring a complete list of all your medications or, if you prefer, bring the medication bottles and we will list them.  Please bring your insurance card so that we may make a copy of it.  If your insurance requires a referral to see a specialist, please bring your referral form from your primary care physician.  Co-payments are due at the time of your visit and may be paid by cash, check or credit card.     Your office visit will consist of a consult with your physician (includes a physical exam), any laboratory testing he/she may order, scheduling of any necessary diagnostic testing (e.g. x-ray, ultrasound, CT-scan), and scheduling of a procedure (e.g. Endoscopy, Colonoscopy) if required.  Please allow enough time on your schedule to allow for any/all of these possibilities.    If you cannot keep your appointment, please call [phone] to cancel or reschedule prior to your appointment date.  This allows [name] the opportunity to schedule an appointment for another patient in need of care.  If you do not cancel or reschedule by 5 p.m. the business day prior to your appointment date, you will be charged a $50.00 late cancellation/no-show fee.    Thank you for choosing  [facility] for your medical needs.  We appreciate the opportunity to care for you.  Please visit [name] at our website  to learn more about our practice.                     Sincerely,                                                             The Gastroenterology Division

## 2010-06-07 NOTE — Progress Notes (Signed)
Summary: Stress Echo Pre-Procedure  Phone Note Outgoing Call Call back at Home Phone [phone]   Call placed by: Irean Hong, RN,  February 08, 2010 4:37 PM Summary of Call: Instructions given to patient for Stress Echo in am. Wendy Sandoval.

## 2010-06-13 NOTE — Letter (Signed)
Summary: Patient [facility] Biopsy Results  [facility]  [address] [place], [place] [address]   Phone: [phone]  Fax: [phone]        June 04, 2010 MRN: [id number]    [facility] [address] [place], [place]  [address]    Dear Ms. Losito,  I am pleased to inform you that the biopsies taken during your recent endoscopic examination did not show any evidence of cancer upon pathologic examination.  Additional information/recommendations:  __No further action is needed at this time.  Please follow-up with      your primary care physician for your other healthcare needs.  __ Please call [phone] to schedule a return visit to review      your condition.  _XX_ Continue with the treatment plan as outlined on the day of your      exam.NO HELICOBACTER NOTED ON PATHOLOGY EXAM...  __ You should have a repeat endoscopic examination for this problem              in _ months/years.   Please call us if you are having persistent problems or have questions about your condition that have not been fully answered at this time.  Sincerely,  Mardella Layman MD [facility]  This letter has been electronically signed by your physician.  Appended Document: Patient Notice-Endo Biopsy Results LETTER MAILED

## 2010-07-09 ENCOUNTER — Telehealth: Payer: Self-pay | Admitting: Family Medicine

## 2010-07-09 NOTE — Telephone Encounter (Signed)
Pt was called, she has sx of UTI and needs OV. Please schedule  [phone] home phone

## 2010-07-09 NOTE — Telephone Encounter (Signed)
Triage vm---having kidney problems again and would like for Harriett Sine to return call.

## 2010-07-09 NOTE — Telephone Encounter (Signed)
Pt need OV, C/O UTI sx again.  She tried to call this AM several times and could not get through, so she left this messsage.  Please call in am to schedule SDA OV.  [phone] home phone

## 2010-07-10 ENCOUNTER — Ambulatory Visit (INDEPENDENT_AMBULATORY_CARE_PROVIDER_SITE_OTHER): Payer: PRIVATE HEALTH INSURANCE | Admitting: Family Medicine

## 2010-07-10 ENCOUNTER — Encounter: Payer: Self-pay | Admitting: Family Medicine

## 2010-07-10 VITALS — BP 170/90 | Temp 98.5°F | Ht 61.75 in | Wt 169.0 lb

## 2010-07-10 DIAGNOSIS — N390 Urinary tract infection, site not specified: Secondary | ICD-10-CM

## 2010-07-10 LAB — POCT URINALYSIS DIPSTICK
Bilirubin, UA: NEGATIVE
Ketones, UA: NEGATIVE
Spec Grav, UA: 1.015

## 2010-07-10 MED ORDER — CIPROFLOXACIN HCL 250 MG PO TABS: 250.0000 mg | ORAL_TABLET | Freq: Two times a day (BID) | ORAL | Status: AC

## 2010-07-10 NOTE — Patient Instructions (Signed)
Urinary Tract Infection (UTI)       Infections of the urinary tract can start in several places. A bladder infection (cystitis), a kidney infection (pyelonephritis), and a prostate infection (prostatitis) are different types of urinary tract infections. They usually get better if treated with medicines (antibiotics) that kill germs. Take all the medicine until it is gone. You or your child may feel better in a few days, but TAKE ALL MEDICINE or the infection may not respond and may become more difficult to treat.     HOME CARE INSTRUCTIONS   Drink enough water and fluids to keep the urine clear or pale yellow. Cranberry juice is especially recommended, in addition to large amounts of water.   Avoid caffeine, tea, and carbonated beverages. They tend to irritate the bladder.   Alcohol may irritate the prostate.   Only take over-the-counter or prescription medicines for pain, discomfort, or fever as directed by your caregiver.     FINDING OUT THE RESULTS OF YOUR TEST  Not all test results are available during your visit. If your or your child's test results are not back during the visit, make an appointment with your caregiver to find out the results. Do not assume everything is normal if you have not heard from your caregiver or the medical facility. It is important for you to follow up on all test results.     TO PREVENT FURTHER INFECTIONS:   Empty the bladder often. Avoid holding urine for long periods of time.   After a bowel movement, women should cleanse from front to back. Use each tissue only once.   Empty the bladder before and after sexual intercourse.     SEEK MEDICAL CARE IF:   There is back pain.   You or your child has an oral temperature above 100.   Your baby is older than 3 months with a rectal temperature of 100.5 F (38.1 C) or higher for more than 1 day.   Your or your child's problems (symptoms) are no better in 3 days. Return sooner if you or your child is getting worse.      SEEK IMMEDIATE MEDICAL CARE IF:   There is severe back pain or lower abdominal pain.   You or your child develops chills.   You or your child has an oral temperature above 100, not controlled by medicine.   Your baby is older than 3 months with a rectal temperature of 102 F (38.9 C) or higher.   Your baby is 3 months old or younger with a rectal temperature of 100.4 F (38 C) or higher.   There is nausea or vomiting.   There is continued burning or discomfort with urination.     MAKE SURE YOU:   Understand these instructions.   Will watch this condition.   Will get help right away if you or your child is not doing well or gets worse.     Document Released: 01/30/2005  Document Re-Released: 07/17/2009  Exit[employer] Patient Information 2011 [employer].

## 2010-07-10 NOTE — Progress Notes (Signed)
  Subjective:    Patient ID: Wendy Sandoval, female    DOB: 04/01/1941, 70 y.o.   MRN: [id number]  HPI  Patient is seen with onset Saturday of urine frequency and burning with urination. By Sunday developed bloody urine and continued burning and some suprapubic discomfort. Denies any fever or chills. Increased malaise. No back pain, nausea, or vomiting.   Review of Systems  Constitutional: Negative for fever, chills and appetite change.  Gastrointestinal: Negative for nausea, vomiting, abdominal pain, diarrhea and constipation.  Genitourinary: Positive for dysuria, frequency and hematuria.  Musculoskeletal: Negative for back pain.  Neurological: Negative for dizziness.       Objective:   Physical Exam     Patient is alert and nontoxic in appearance. She is afebrile.   oropharynx moist and clear Chest clear to auscultation Heart regular rhythm and rate Back no CVA tenderness   Assessment & Plan:   UTI. Urine culture sent. Cipro 250 mg twice daily for  7 days.

## 2010-07-10 NOTE — Telephone Encounter (Signed)
Pt was sch for ov today 07/09/09, as noted. Pt aware.

## 2010-07-10 NOTE — Progress Notes (Deleted)
  Subjective:    Patient ID: Wendy Sandoval, female    DOB: Feb 25, 1941, 70 y.o.   MRN: [id number]  HPI  Followup hypertension. Patient brings in his own machine today. Consistent ratings of hours. Home blood pressures reviewed. Consistently below 130 systolic and below 80 diastolic. Feels well. Denies any dizziness, headache, or chest pain.   upper extremity pain last visit. Discontinued Zocor and symptoms have improved greatly. Followup at [facility] next mild and plans to get repeat lipids then. No history of CAD or peripheral vascular disease.   Review of Systems     Objective:   Physical Exam  patient is alert and in distress. Blood pressure 136/70 Oropharynx is clear Neck supple no mass Chest clear to auscultation Heart regular rhythm and rate Extremities no edema       Assessment & Plan:   #1 hypertension stable. Good control by home readings. No med changes at this time #2 bilateral upper extremity pain probably related to statin use. Continue off statin at this time. Reassess lipids in one month

## 2010-07-13 LAB — URINE CULTURE: Colony Count: >=100000

## 2010-07-13 NOTE — Progress Notes (Signed)
Quick Note:  Pt informed ______ 

## 2010-07-18 ENCOUNTER — Encounter: Payer: Self-pay | Admitting: Family Medicine

## 2010-07-18 ENCOUNTER — Ambulatory Visit (INDEPENDENT_AMBULATORY_CARE_PROVIDER_SITE_OTHER): Payer: PRIVATE HEALTH INSURANCE | Admitting: Family Medicine

## 2010-07-18 VITALS — BP 140/70 | Temp 98.5°F

## 2010-07-18 DIAGNOSIS — R300 Dysuria: Secondary | ICD-10-CM

## 2010-07-18 LAB — POCT URINALYSIS DIPSTICK
Bilirubin, UA: NEGATIVE
Glucose, UA: NEGATIVE
Ketones, UA: NEGATIVE
Spec Grav, UA: 1.015
Urobilinogen, UA: 0.2

## 2010-07-18 MED ORDER — CIPROFLOXACIN HCL 500 MG PO TABS: 500.0000 mg | ORAL_TABLET | Freq: Two times a day (BID) | ORAL | Status: AC

## 2010-07-18 NOTE — Progress Notes (Signed)
  Subjective:    Patient ID: Wendy Sandoval, female    DOB: 1940-12-31, 70 y.o.   MRN: [id number]  HPI  Patient is seen with some recurrent urinary symptoms of burning with urination and frequency. She does have some chronic abdominal pains right mid abdomen radiating toward the back. Has had recent full gastroenterology workup. Recent UTI with Enterobacter species by culture on 07/10/2010. Treated with Cipro and felt better until about 4 days ago. Denies any fever or chills. No vomiting. Mild nausea. Patient relates cystocele with occasional stress incontinence. Abdominal ultrasound January 16 normal-appearing kidneys. No gross hematuria.   patient had urine cultures back in June and October of 2011 that were both negative. Only recent positive culture was last week.   Review of Systems  Constitutional: Positive for fatigue. Negative for fever, chills and appetite change.  Gastrointestinal: Positive for abdominal pain. Negative for nausea, vomiting, diarrhea and constipation.  Genitourinary: Positive for dysuria and frequency. Negative for difficulty urinating.  Musculoskeletal: Positive for back pain.  Neurological: Positive for weakness. Negative for dizziness.       Objective:   Physical Exam  patient is alert and nontoxic in appearance. Afebrile.  Chest clear to auscultation Heart regular rhythm and rate Back no CVA tenderness Abdomen soft and nontender. No guarding or rebound. Urine dipstick reveals 2+ blood and 2+ leukocytes. Negative nitrites       Assessment & Plan:   Recurrent dysuria , pyuria , and hematuria with recent positive culture treated for Enterobacter with Cipro. Repeat urine culture. Drink plenty of fluids. Reassess urinalysis one week. If still has hematuria that point urology referral

## 2010-07-18 NOTE — Patient Instructions (Signed)
Follow up in one week for repeat urinalysis

## 2010-07-19 LAB — CBC
HCT: 39 % (ref 36.0–46.0)
Hemoglobin: 13.7 g/dL (ref 12.0–15.0)
MCH: 32.5 pg (ref 26.0–34.0)
MCV: 92.4 fL (ref 78.0–100.0)
Platelets: 221 10*3/uL (ref 150–400)
RBC: 4.22 MIL/uL (ref 3.87–5.11)
WBC: 7.5 10*3/uL (ref 4.0–10.5)

## 2010-07-19 LAB — COMPREHENSIVE METABOLIC PANEL
AST: 24 U/L (ref 0–37)
Albumin: 3.8 g/dL (ref 3.5–5.2)
Alkaline Phosphatase: 58 U/L (ref 39–117)
BUN: 15 mg/dL (ref 6–23)
CO2: 27 mEq/L (ref 19–32)
Chloride: 102 mEq/L (ref 96–112)
GFR calc non Af Amer: >60 mL/min (ref >60)
Potassium: 3.2 mEq/L — ABNORMAL LOW (ref 3.5–5.1)
Total Bilirubin: 0.5 mg/dL (ref 0.3–1.2)

## 2010-07-19 LAB — POCT I-STAT, CHEM 8
Calcium, Ion: 1.06 mmol/L — ABNORMAL LOW (ref 1.12–1.32)
Glucose, Bld: 107 mg/dL — ABNORMAL HIGH (ref 70–99)
HCT: 40 % (ref 36.0–46.0)
Hemoglobin: 13.6 g/dL (ref 12.0–15.0)
Potassium: 3.3 mEq/L — ABNORMAL LOW (ref 3.5–5.1)

## 2010-07-19 LAB — POCT CARDIAC MARKERS: Myoglobin, poc: 78.9 ng/mL (ref 12–200)

## 2010-07-23 MED ORDER — CEPHALEXIN 500 MG PO CAPS: 500.0000 mg | ORAL_CAPSULE | Freq: Three times a day (TID) | ORAL | Status: AC

## 2010-07-23 NOTE — Progress Notes (Signed)
Quick Note:  Pt informed and she voiced her understanding, Rx sent ______

## 2010-07-23 NOTE — Progress Notes (Signed)
Addended by: Sid Falcon on: 07/23/2010 05:21 PM   Modules accepted: Orders

## 2010-07-25 ENCOUNTER — Other Ambulatory Visit: Payer: PRIVATE HEALTH INSURANCE

## 2010-07-31 ENCOUNTER — Other Ambulatory Visit: Payer: PRIVATE HEALTH INSURANCE

## 2010-07-31 ENCOUNTER — Ambulatory Visit (INDEPENDENT_AMBULATORY_CARE_PROVIDER_SITE_OTHER): Payer: PRIVATE HEALTH INSURANCE | Admitting: Family Medicine

## 2010-07-31 ENCOUNTER — Encounter: Payer: Self-pay | Admitting: Family Medicine

## 2010-07-31 VITALS — BP 144/70 | Temp 98.6°F

## 2010-07-31 DIAGNOSIS — I10 Essential (primary) hypertension: Secondary | ICD-10-CM

## 2010-07-31 DIAGNOSIS — R300 Dysuria: Secondary | ICD-10-CM

## 2010-07-31 DIAGNOSIS — T148 Other injury of unspecified body region: Secondary | ICD-10-CM

## 2010-07-31 DIAGNOSIS — W57XXXA Bitten or stung by nonvenomous insect and other nonvenomous arthropods, initial encounter: Secondary | ICD-10-CM

## 2010-07-31 LAB — POCT URINALYSIS DIPSTICK
Ketones, UA: NEGATIVE
Protein, UA: NEGATIVE
Spec Grav, UA: 1.01
pH, UA: 6.5

## 2010-07-31 NOTE — Patient Instructions (Signed)
Continue to monitor blood pressure closely and follow up if BP is consistently over 140/90. Follow up promptly for any rash or fever.

## 2010-07-31 NOTE — Progress Notes (Signed)
  Subjective:    Patient ID: Wendy Sandoval, female    DOB: Jul 30, 1940, 70 y.o.   MRN: [id number]  HPI  Patient seen for the following issues   She has had a couple of back to back urine tract infections. Initially had Enterobacter which was treated with ciprofloxacin and improved. Subsequently Escherichia coli which was resistant to Cipro. She was switched to cephalexin and symptoms are fully resolved at this time. We recommend followup today. She denies any back pain, nausea, vomiting, or fever. No burning with urination.   Separate issue of very small tick right foot not clearly bitten in couple days ago. Denies any rash, headache, or fever. No arthralgias.  Hypertension treated with metoprolol , lisinopril, and hydrochlorothiazide. Blood pressures consistently below 140/90 by home readings. Probable white coat syndrome. Patient compliant with therapy.  No side effects.   Review of Systems  Constitutional: Negative for fever, chills and fatigue.  HENT: Negative for neck pain and neck stiffness.   Respiratory: Negative for cough and shortness of breath.   Cardiovascular: Negative for chest pain.  Gastrointestinal: Negative for nausea and vomiting.  Genitourinary: Negative for dysuria and flank pain.  Musculoskeletal: Negative for arthralgias.  Skin: Negative for rash.  Neurological: Negative for headaches.  Hematological: Negative for adenopathy.       Objective:   Physical Exam  Constitutional: She is oriented to person, place, and time. She appears well-developed and well-nourished.  HENT:  Head: Normocephalic and atraumatic.  Right Ear: External ear normal.  Left Ear: External ear normal.  Mouth/Throat: No oropharyngeal exudate.  Eyes: Conjunctivae are normal. Pupils are equal, round, and reactive to light.  Neck: Normal range of motion. Neck supple. No thyromegaly present.  Cardiovascular: Normal rate, regular rhythm and normal heart sounds.  Exam reveals no gallop.     Pulmonary/Chest: Effort normal and breath sounds normal. She has no wheezes. She has no rales.  Musculoskeletal: She exhibits no edema.  Lymphadenopathy:    She has no cervical adenopathy.  Neurological: She is alert and oriented to person, place, and time.  Skin: No rash noted.  Psychiatric: She has a normal mood and affect.          Assessment & Plan:   #1 recurrent UTI. Urine dipstick today shows 2+ leukocytes and trace lysed blood. Negative nitrites. Repeat urine culture given her history of frequent recurrences she is asymptomatic at this time. No antibiotic at this time.  #2 recent possible tick bite with deer tick. Reviewed signs and symptoms of Lyme disease. Observation at this time  #3 hypertension controlled by home readings. Suspect white coat syndrome.  Continue to monitor.

## 2010-08-01 ENCOUNTER — Encounter: Payer: Self-pay | Admitting: Family Medicine

## 2010-08-03 LAB — URINE CULTURE: Colony Count: 10000

## 2010-08-08 ENCOUNTER — Telehealth: Payer: Self-pay | Admitting: *Deleted

## 2010-08-08 DIAGNOSIS — R300 Dysuria: Secondary | ICD-10-CM

## 2010-08-08 NOTE — Telephone Encounter (Signed)
She has low colony count of pseudomonas.  She has had 3 consecutive cultures positive for 3 different organisms. I would recommend urology referral at this time to further assess.  [facility] unless she has another preference.

## 2010-08-08 NOTE — Telephone Encounter (Signed)
Pt stopped by to follow up on missed phone call, questioning result of most recent urine culture. Advice please

## 2010-08-09 NOTE — Telephone Encounter (Signed)
Referral made, pt was informed of referral yesterday when she stopped by office

## 2010-08-11 ENCOUNTER — Other Ambulatory Visit: Payer: Self-pay | Admitting: Family Medicine

## 2010-08-28 ENCOUNTER — Telehealth: Payer: Self-pay | Admitting: Gastroenterology

## 2010-08-28 NOTE — Telephone Encounter (Signed)
Notified pt Dr Jarold Motto would like to see her to discuss her fatty liver. Pt will see Dr Jarold Motto on 09/07/10 at 0845.

## 2010-08-28 NOTE — Telephone Encounter (Signed)
Pt with OV on 05/15/10 for RUQ pain under her r breast. ABD. U/S was repeated on 05/21/10 showing hepatic steatosis followed by ENDO on 05/23/10 with Schatzki's ring dilated; H. Pylori negative.  She read an article in the paper yesterday about fatty liver disease. Pt reports her energy level is down and she doesn't feel good. She does report frequent UTI's that clear with antibiotics and she has been referred to a Urologist who has ordered another U/S on 09/20/10. Pt wants to know if she should worry about the fatty liver and is this causing her occasional pain? I tried to explain obesity can cause the liver to be fat as well as her diet. Any other advise?  Thanks.

## 2010-08-28 NOTE — Telephone Encounter (Signed)
Needs ov ti discuss.Marland KitchenMarland Kitchen

## 2010-09-07 ENCOUNTER — Other Ambulatory Visit (INDEPENDENT_AMBULATORY_CARE_PROVIDER_SITE_OTHER): Payer: 59

## 2010-09-07 ENCOUNTER — Encounter: Payer: Self-pay | Admitting: Gastroenterology

## 2010-09-07 ENCOUNTER — Other Ambulatory Visit: Payer: Self-pay | Admitting: Family Medicine

## 2010-09-07 ENCOUNTER — Other Ambulatory Visit: Payer: PRIVATE HEALTH INSURANCE

## 2010-09-07 ENCOUNTER — Ambulatory Visit (INDEPENDENT_AMBULATORY_CARE_PROVIDER_SITE_OTHER): Payer: 59 | Admitting: Gastroenterology

## 2010-09-07 VITALS — BP 140/76 | HR 72 | Ht 62.0 in | Wt 165.6 lb

## 2010-09-07 DIAGNOSIS — K7689 Other specified diseases of liver: Secondary | ICD-10-CM

## 2010-09-07 DIAGNOSIS — E7800 Pure hypercholesterolemia, unspecified: Secondary | ICD-10-CM

## 2010-09-07 DIAGNOSIS — R1011 Right upper quadrant pain: Secondary | ICD-10-CM

## 2010-09-07 DIAGNOSIS — K219 Gastro-esophageal reflux disease without esophagitis: Secondary | ICD-10-CM | POA: Insufficient documentation

## 2010-09-07 DIAGNOSIS — K760 Fatty (change of) liver, not elsewhere classified: Secondary | ICD-10-CM | POA: Insufficient documentation

## 2010-09-07 LAB — HEPATIC FUNCTION PANEL
ALT: 17 U/L (ref 0–35)
Albumin: 3.6 g/dL (ref 3.5–5.2)
Alkaline Phosphatase: 51 U/L (ref 39–117)
Total Protein: 6.4 g/dL (ref 6.0–8.3)

## 2010-09-07 LAB — LIPID PANEL
Cholesterol: 126 mg/dL (ref 0–200)
LDL Cholesterol: 76 mg/dL (ref 0–99)
Triglycerides: 70 mg/dL (ref 0.0–149.0)

## 2010-09-07 NOTE — Patient Instructions (Signed)
Follow up once a year.

## 2010-09-07 NOTE — Progress Notes (Signed)
This is a 70 year old Caucasian female recently dilated because of an esophageal stricture with associated GERD. She currently is on Nexium 40 mg a day and is asymptomatic. Because of right upper quadrant pain she underwent ultrasonography which showed mild fatty infiltration of her liver. She also has a right kidney stone and is scheduled for CT scan of her abdomen by urology. Currently she denies any specific GI symptoms otherwise.  Current Medications, Allergies, Past Medical History, Past Surgical History, Family History and Social History were reviewed in [employer] record.  Pertinent Review of Systems Negative--no specific hepatobiliary, genitourinary, cardiovascular pulmonary symptomatology. She denies a specific food intolerance, anorexia or weight loss. She is being treated for hyperlipidemia.   Physical Exam: No stigmata of chronic liver disease noted. Chest is clear and she appeared to be in a regular rhythm without murmurs gallops or rubs. Abdominal exam shows no hepatosplenomegaly, abdominal masses or tenderness. Bowel sounds are normal. The extremities are unremarkable mental status is normal.   Assessment and Plan: She has mild fatty infiltration of her liver with previous normal liver function tests. There is no evidence of organomegaly on physical exam. I have urged her to follow through with her urologic appointment and workup. We will repeat her liver function tests today and continue her reflux regime with daily Nexium when necessary GI followup as needed.

## 2010-09-17 ENCOUNTER — Ambulatory Visit (INDEPENDENT_AMBULATORY_CARE_PROVIDER_SITE_OTHER): Payer: 59 | Admitting: Family Medicine

## 2010-09-17 ENCOUNTER — Encounter: Payer: Self-pay | Admitting: Family Medicine

## 2010-09-17 DIAGNOSIS — I10 Essential (primary) hypertension: Secondary | ICD-10-CM

## 2010-09-17 DIAGNOSIS — K219 Gastro-esophageal reflux disease without esophagitis: Secondary | ICD-10-CM

## 2010-09-17 DIAGNOSIS — E7800 Pure hypercholesterolemia, unspecified: Secondary | ICD-10-CM

## 2010-09-17 DIAGNOSIS — N390 Urinary tract infection, site not specified: Secondary | ICD-10-CM

## 2010-09-17 MED ORDER — ESOMEPRAZOLE MAGNESIUM 40 MG PO CPDR: 40.0000 mg | DELAYED_RELEASE_CAPSULE | Freq: Every day | ORAL | Status: DC

## 2010-09-17 MED ORDER — LISINOPRIL 10 MG PO TABS: 10.0000 mg | ORAL_TABLET | Freq: Every day | ORAL | Status: DC

## 2010-09-17 MED ORDER — METOPROLOL TARTRATE 100 MG PO TABS: ORAL_TABLET | ORAL | Status: DC

## 2010-09-17 MED ORDER — HYDROCHLOROTHIAZIDE 25 MG PO TABS: 25.0000 mg | ORAL_TABLET | Freq: Every day | ORAL | Status: DC

## 2010-09-17 MED ORDER — SIMVASTATIN 40 MG PO TABS: 40.0000 mg | ORAL_TABLET | Freq: Every day | ORAL | Status: DC

## 2010-09-17 NOTE — Progress Notes (Signed)
  Subjective:    Patient ID: Wendy Sandoval, female    DOB: 07/23/1940, 70 y.o.   MRN: [id number]  HPI Patient here for medical followup. Recurrent UTI. Seen by urologist. No major structural problems noted. Placed on trimethoprim 100 mg daily with no infections since then. Overall she is pleased.  History of hypertension treated with hydrochlorothiazide, lisinopril, and metoprolol. No orthostasis.  Probable white coat syndrome. Blood pressures consistently good at home. Mostly around 120 systolic.  No headaches. No dizziness. Denies recent chest pain.  Hyperlipidemia treated with simvastatin and niacin. Recent labs reviewed. Hepatic function stable. HDL has declined. She is exercising less frequently.   Review of Systems  Constitutional: Negative for activity change and appetite change.  Respiratory: Negative for cough and shortness of breath.   Cardiovascular: Negative for chest pain, palpitations and leg swelling.  Genitourinary: Negative for dysuria and hematuria.  Neurological: Negative for dizziness, syncope, weakness and headaches.  Hematological: Negative for adenopathy. Does not bruise/bleed easily.  Psychiatric/Behavioral: Negative for dysphoric mood.       Objective:   Physical Exam  Constitutional: She is oriented to person, place, and time. She appears well-developed and well-nourished.  HENT:  Right Ear: External ear normal.  Left Ear: External ear normal.  Mouth/Throat: Oropharynx is clear and moist. No oropharyngeal exudate.  Neck: Neck supple. No thyromegaly present.  Cardiovascular: Normal rate, regular rhythm and normal heart sounds.   Pulmonary/Chest: Effort normal and breath sounds normal. No respiratory distress. She has no wheezes. She has no rales.  Abdominal: Soft. Bowel sounds are normal. She exhibits no mass. There is no tenderness. There is no guarding.  Musculoskeletal: She exhibits no edema.  Lymphadenopathy:    She has no cervical adenopathy.    Neurological: She is alert and oriented to person, place, and time.  Skin: No rash noted.  Psychiatric: She has a normal mood and affect.          Assessment & Plan:  #1 recurrent UTI. Patient low dose trimethoprim prevention #2 hyperlipidemia. Labs reviewed with patient. Refilled simvastatin for one year #3 hypertension stable refill medications for one year #4 history of GERD with esophageal stricture. Nexium refilled.

## 2010-09-18 NOTE — Assessment & Plan Note (Signed)
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  I believe she is retired at this time.  She comes to my office on her  own accord today because of right-sided abdominal pain of the right  upper quadrant that radiates into her back with some acid reflux  symptoms, nausea, gas, and bloating.  This pain is very periodic in  nature and comes and lasts a few hours in duration, has no real  precipitating or alleviating cause.  She has not had recent x-rays and  last had ultrasound of her abdomen performed in 2001, which was  unremarkable.  She does have chronic acid reflux but had previous  endoscopy with esophageal dilatation in January 2003.  Her last  colonoscopy was in February 2003 and she had rather marked  diverticulosis with a very long and redundant colon with some adhesions  in the left lower quadrant area.   The patient has rather classic irritable bowel syndrome with alternating  diarrhea and constipation but denies melena or hematochezia.  She has  had no anorexia or weight loss but does have some urgency with fried,  fatty, greasy foods.  She relates that she does yearly Hemoccult cards,  which have been guaiac-negative.   PAST MEDICAL HISTORY:  1. Hypertension.  2. Hypercholesterolemia.  3. Degenerative arthritis.  4. Chronic depression.  5. She has had previous appendectomy and hysterectomy.   MEDICATIONS:  1. Toprol 100 mg a day.  2. Lisinopril 10 mg a day.  3. Hydrochlorothiazide 25 mg a day.  4. Aspirin 325 mg a day.  5. Nexium 40 mg a day, but she actually only takes this every other      day.  6. Advicor 100/20 mg at bedtime.  7. Tylenol p.r.n.   She has had nausea with codeine and antihistamine  use.   FAMILY HISTORY:  Noncontributory for any known gastrointestinal  problems.  Her father and mother both had atherosclerotic cardiovascular  disease.   SOCIAL HISTORY:  She is widowed and lives alone.  She has a twelfth  grade education.  She does not smoke or use ethanol.   REVIEW OF SYSTEMS:  Positive for multiple complaints including  peripheral edema; dry, nonproductive cough; diffuse arthralgias,  myalgias; chronic insomnia; chronic low back pain; mild urinary  incontinence; and shortness of breath with exertion.  Review of systems  otherwise noncontributory.   EXAMINATION:  She is a healthy-appearing white female in no distress,  appearing her stated age.  She is 5 feet 3 inches tall and weighs 173 pounds.  Blood pressure is  138/80 and pulse was 66 and regular.  I could not appreciate stigmata of chronic liver disease or thyromegaly.  CHEST:  Generally clear.  She appeared to be in a regular rhythm without murmurs, gallops or rubs.  There was no organomegaly, abdominal masses or tenderness.  Bowel sounds  were normal.  Peripheral extremities were unremarkable.  Mental status was clear.   ASSESSMENT:  1. Right upper quadrant pain, rule out cholelithiasis versus chronic      gastroesophageal reflux disease with inadequate proton pump      inhibitor therapy.  2. Classic irritable bowel syndrome with associated abdominal gas and      bloating.  Consider bacterial overgrowth syndrome.  3. History of well-controlled essential hypertension and      hypercholesterolemia.  4. History of degenerative arthritis.  5. History of chronic depression without treatment at this time.   RECOMMENDATIONS:  1. Standard antireflux regimen, and I have asked her to take her      Nexium 40 mg 30 minutes before each meal of the day.  2. Upper abdominal ultrasound exam.  3. Outpatient endoscopy and colonoscopy follow-up.  4. At the time of her endoscopic procedure she should have small  bowel      biopsy to exclude celiac disease and also random colon biopsies to      exclude microscopic colitis, although I think this is unlikely.  5. Continue other multiple medications as per Dr. Molly Maduro Day, her      primary care physician.     Vania Rea. Jarold Motto, MD, Caleen Essex, FAGA  Electronically Signed    DRP/MedQ  DD: 02/24/2007  DT: 02/25/2007  Job #: [id number]   cc:   Alfredia Client, MD

## 2010-10-01 ENCOUNTER — Other Ambulatory Visit: Payer: Self-pay | Admitting: Family Medicine

## 2010-11-29 DIAGNOSIS — C4492 Squamous cell carcinoma of skin, unspecified: Secondary | ICD-10-CM

## 2010-11-29 HISTORY — DX: Squamous cell carcinoma of skin, unspecified: C44.92

## 2011-03-11 ENCOUNTER — Telehealth: Payer: Self-pay | Admitting: Family Medicine

## 2011-03-11 DIAGNOSIS — E785 Hyperlipidemia, unspecified: Secondary | ICD-10-CM

## 2011-03-11 NOTE — Telephone Encounter (Signed)
Addended by: Kristian Covey on: 03/11/2011 03:09 PM   Modules accepted: Orders

## 2011-03-11 NOTE — Telephone Encounter (Signed)
ordered

## 2011-03-11 NOTE — Telephone Encounter (Signed)
Pt called in. States she needs a 68mo cholesterol check. I do not see an order for this. Made her a lab appt this Wed. For it (& to get flu shot). Please put in an order for the lipids. Thanks!

## 2011-03-13 ENCOUNTER — Ambulatory Visit: Payer: 59 | Admitting: Family Medicine

## 2011-03-13 ENCOUNTER — Other Ambulatory Visit (INDEPENDENT_AMBULATORY_CARE_PROVIDER_SITE_OTHER): Payer: 59

## 2011-03-13 DIAGNOSIS — E785 Hyperlipidemia, unspecified: Secondary | ICD-10-CM

## 2011-03-13 DIAGNOSIS — Z23 Encounter for immunization: Secondary | ICD-10-CM

## 2011-03-13 LAB — LIPID PANEL: HDL: 48.1 mg/dL (ref >39.00)

## 2011-03-20 ENCOUNTER — Ambulatory Visit: Payer: 59 | Admitting: Family Medicine

## 2011-03-27 ENCOUNTER — Ambulatory Visit (INDEPENDENT_AMBULATORY_CARE_PROVIDER_SITE_OTHER): Payer: 59 | Admitting: Family Medicine

## 2011-03-27 ENCOUNTER — Encounter: Payer: Self-pay | Admitting: Family Medicine

## 2011-03-27 DIAGNOSIS — E7800 Pure hypercholesterolemia, unspecified: Secondary | ICD-10-CM

## 2011-03-27 DIAGNOSIS — I10 Essential (primary) hypertension: Secondary | ICD-10-CM

## 2011-03-27 DIAGNOSIS — N390 Urinary tract infection, site not specified: Secondary | ICD-10-CM

## 2011-03-27 DIAGNOSIS — K219 Gastro-esophageal reflux disease without esophagitis: Secondary | ICD-10-CM

## 2011-03-27 NOTE — Progress Notes (Signed)
  Subjective:    Patient ID: Wendy Sandoval, female    DOB: 1941-02-07, 70 y.o.   MRN: [id number]  HPI  Medical followup. Medical problems include history of dyslipidemia, GERD, hypertension, and recurrent UTI. Currently takes Macrobid for UTI prevention and this seems to be working well.  GERD symptoms controlled with Nexium. No dysphagia. Hypertension meds reviewed.  Compliant with all. Blood pressure well controlled by home readings. No orthostasis. No chest pains or shortness of breath. Exercising regularly.  Hyperlipidemia treated with simvastatin 40 mg daily. No myalgias. Recent labs reviewed with patient and some improvement, especially in HDL with more consistent exercise.  Past Medical History  Diagnosis Date  . HYPERCHOLESTEROLEMIA 07/07/2007  . HYPERTENSION 07/07/2007  . GERD 08/09/2008  . DIVERTICULOSIS, COLON 03/17/2007  . Fatty liver   . TMJ syndrome   . Headache   . Hiatal hernia   . DJD (degenerative joint disease)   . Esophageal stricture   . Depression   . IBS (irritable bowel syndrome)    Past Surgical History  Procedure Date  . Appendectomy   . Abdominal hysterectomy 1998  . Breast surgery     milk gland  . Lung surgery 1983    reports that she has never smoked. She has never used smokeless tobacco. She reports that she does not drink alcohol or use illicit drugs. family history includes Alcohol abuse in an unspecified family member; Arthritis in an unspecified family member; Diabetes in an unspecified family member; Heart disease in an unspecified family member; Hyperlipidemia in an unspecified family member; Hypertension in an unspecified family member; and Stroke in an unspecified family member. Allergies  Allergen Reactions  . Codeine Sulfate     REACTION: nausea  . Histamine     Anxious feeling      Review of Systems  Constitutional: Negative for fatigue.  Eyes: Negative for visual disturbance.  Respiratory: Negative for cough, chest tightness,  shortness of breath and wheezing.   Cardiovascular: Negative for chest pain, palpitations and leg swelling.  Genitourinary: Negative for flank pain.  Neurological: Negative for dizziness, seizures, syncope, weakness, light-headedness and headaches.       Objective:   Physical Exam  Constitutional: She is oriented to person, place, and time. She appears well-developed and well-nourished.  HENT:  Mouth/Throat: Oropharynx is clear and moist.  Eyes: Pupils are equal, round, and reactive to light.  Neck: Neck supple. No thyromegaly present.  Cardiovascular: Normal rate and regular rhythm.   Pulmonary/Chest: Effort normal and breath sounds normal. No respiratory distress. She has no wheezes. She has no rales.  Musculoskeletal: She exhibits no edema.  Lymphadenopathy:    She has no cervical adenopathy.  Neurological: She is alert and oriented to person, place, and time.          Assessment & Plan:  #1 hypertension. Stable. Continue current medications #2 GERD stable continue Nexium 40 mg daily #3 dyslipidemia. Labs reviewed with patient and favorable. Continue current medications. Discussed measures to reduce flushing with niacin.  #4 recurrent UTI. Currently doing well with Macrobid for prevention.

## 2011-04-02 ENCOUNTER — Other Ambulatory Visit: Payer: Self-pay | Admitting: Family Medicine

## 2011-04-02 DIAGNOSIS — Z1231 Encounter for screening mammogram for malignant neoplasm of breast: Secondary | ICD-10-CM

## 2011-04-23 DIAGNOSIS — C4491 Basal cell carcinoma of skin, unspecified: Secondary | ICD-10-CM

## 2011-04-23 HISTORY — DX: Basal cell carcinoma of skin, unspecified: C44.91

## 2011-05-06 ENCOUNTER — Ambulatory Visit (HOSPITAL_COMMUNITY)
Admission: RE | Admit: 2011-05-06 | Discharge: 2011-05-06 | Disposition: A | Discharge status: Home / Self Care | Payer: PRIVATE HEALTH INSURANCE | Source: Ambulatory Visit | Attending: Family Medicine | Admitting: Family Medicine

## 2011-05-06 DIAGNOSIS — Z1231 Encounter for screening mammogram for malignant neoplasm of breast: Secondary | ICD-10-CM | POA: Insufficient documentation

## 2011-06-04 ENCOUNTER — Encounter: Payer: Self-pay | Admitting: Family Medicine

## 2011-06-04 ENCOUNTER — Ambulatory Visit (INDEPENDENT_AMBULATORY_CARE_PROVIDER_SITE_OTHER): Payer: 59 | Admitting: Family Medicine

## 2011-06-04 DIAGNOSIS — J019 Acute sinusitis, unspecified: Secondary | ICD-10-CM

## 2011-06-04 MED ORDER — AMOXICILLIN 875 MG PO TABS: 875.0000 mg | ORAL_TABLET | Freq: Two times a day (BID) | ORAL | Status: AC

## 2011-06-04 NOTE — Progress Notes (Signed)
  Subjective:    Patient ID: Wendy Sandoval, female    DOB: [birth year], 71 y.o.   MRN: [id number]  HPI  Acute visit. Patient seen with nasal congestion and mild cough the past couple weeks. She had progressive right maxillary facial pain and intermittent headaches. Yellowish discharge past couple days. Denies any fever or chills. Intermittent mild sore throat. Increased fatigue. Has had neck and upper back pain for about 2 months but no radiculopathy symptoms. Poor sleep quality. Intermittent upper right tooth pain. Patient is nonsmoker.  Review of Systems  Constitutional: Positive for fatigue. Negative for fever and chills.  HENT: Positive for congestion, sore throat and sinus pressure. Negative for ear pain.   Respiratory: Positive for cough. Negative for shortness of breath and wheezing.   Neurological: Positive for headaches.       Objective:   Physical Exam  Constitutional: She appears well-developed and well-nourished.  HENT:  Right Ear: External ear normal.  Left Ear: External ear normal.  Mouth/Throat: Oropharynx is clear and moist.  Neck: Neck supple.  Cardiovascular: Normal rate and regular rhythm.   Pulmonary/Chest: Effort normal and breath sounds normal. No respiratory distress. She has no wheezes. She has no rales.  Lymphadenopathy:    She has no cervical adenopathy.          Assessment & Plan:  Acute sinusitis. Amoxicillin 875 mg twice a day for 10 days. Reassess with medical followup in one month. If patient still having significant fatigue issues that point consider labs including CBC, TSH, and basic metabolic panel

## 2011-06-04 NOTE — Patient Instructions (Signed)

## 2011-07-04 ENCOUNTER — Encounter: Payer: Self-pay | Admitting: Family Medicine

## 2011-07-04 ENCOUNTER — Ambulatory Visit (INDEPENDENT_AMBULATORY_CARE_PROVIDER_SITE_OTHER): Payer: 59 | Admitting: Family Medicine

## 2011-07-04 DIAGNOSIS — Z299 Encounter for prophylactic measures, unspecified: Secondary | ICD-10-CM

## 2011-07-04 DIAGNOSIS — K219 Gastro-esophageal reflux disease without esophagitis: Secondary | ICD-10-CM

## 2011-07-04 DIAGNOSIS — R5381 Other malaise: Secondary | ICD-10-CM

## 2011-07-04 DIAGNOSIS — R5383 Other fatigue: Secondary | ICD-10-CM

## 2011-07-04 DIAGNOSIS — I10 Essential (primary) hypertension: Secondary | ICD-10-CM

## 2011-07-04 MED ORDER — PNEUMOCOCCAL VAC POLYVALENT 25 MCG/0.5ML IJ INJ: 0.5000 mL | INJECTION | Freq: Once | INTRAMUSCULAR | Status: DC

## 2011-07-04 NOTE — Progress Notes (Signed)
  Subjective:    Patient ID: Wendy Sandoval, female    DOB: February 17, 1941, 71 y.o.   MRN: [id number]  HPI  Patient seen for followup multiple medical problems. History of hypertension, hyperlipidemia, depression, and GERD. Cannot confirm prior Pneumovax. Medications reviewed. Compliant with all. Still with fatigue issues. We discussed possible lab work. Overall her fatigue is slightly improved compared to one month ago when she was dealing with sinus infection. She resumed exercise 3 times per week. She has occasional dyspnea with activity which she thinks may be deconditioning. Nonsmoker. No chronic lung disease. No chest pain. She had echocardiogram and stress testing over one year ago which revealed no significant abnormalities. Denies orthopnea. Cough improved from last visit.  Past Medical History  Diagnosis Date  . HYPERCHOLESTEROLEMIA 07/07/2007  . HYPERTENSION 07/07/2007  . GERD 08/09/2008  . DIVERTICULOSIS, COLON 03/17/2007  . Fatty liver   . TMJ syndrome   . Headache   . Hiatal hernia   . DJD (degenerative joint disease)   . Esophageal stricture   . Depression   . IBS (irritable bowel syndrome)    Past Surgical History  Procedure Date  . Appendectomy   . Abdominal hysterectomy 1998  . Breast surgery     milk gland  . Lung surgery 1983    reports that she has never smoked. She has never used smokeless tobacco. She reports that she does not drink alcohol or use illicit drugs. family history includes Alcohol abuse in an unspecified family member; Arthritis in an unspecified family member; Diabetes in an unspecified family member; Heart disease in an unspecified family member; Hyperlipidemia in an unspecified family member; Hypertension in an unspecified family member; and Stroke in an unspecified family member. Allergies  Allergen Reactions  . Codeine Sulfate     REACTION: nausea  . Histamine     Anxious feeling      Review of Systems  Constitutional: Positive for fatigue.    Respiratory: Positive for shortness of breath. Negative for cough and wheezing.   Cardiovascular: Negative for chest pain, palpitations and leg swelling.  Gastrointestinal: Negative for abdominal pain.  Genitourinary: Negative for dysuria.  Neurological: Negative for dizziness, syncope and weakness.  Hematological: Negative for adenopathy. Does not bruise/bleed easily.       Objective:   Physical Exam  Constitutional: She is oriented to person, place, and time. She appears well-developed and well-nourished.  HENT:  Mouth/Throat: Oropharynx is clear and moist.  Neck: Neck supple. No thyromegaly present.  Cardiovascular: Normal rate and regular rhythm.   Pulmonary/Chest: Effort normal and breath sounds normal. No respiratory distress. She has no wheezes. She has no rales.  Musculoskeletal: She exhibits no edema.  Lymphadenopathy:    She has no cervical adenopathy.  Neurological: She is alert and oriented to person, place, and time. No cranial nerve deficit.  Psychiatric: She has a normal mood and affect. Her behavior is normal.          Assessment & Plan:  #1 hypertension. Marginal control. Work on weight loss. Continue close monitoring #2 ongoing fatigue. Overall slightly improved. Continue exercise weight loss efforts. Check labs - CBC, TSH, and basic metabolic panel  #3 GERD stable on Nexium  #4 hyperlipidemia stable on simvastatin. Recent labs reviewed with patient with lipids at goal  #5 health maintenance. Pneumovax given

## 2011-07-12 ENCOUNTER — Other Ambulatory Visit (INDEPENDENT_AMBULATORY_CARE_PROVIDER_SITE_OTHER): Payer: 59

## 2011-07-12 DIAGNOSIS — I10 Essential (primary) hypertension: Secondary | ICD-10-CM

## 2011-07-12 DIAGNOSIS — E039 Hypothyroidism, unspecified: Secondary | ICD-10-CM

## 2011-07-12 DIAGNOSIS — D649 Anemia, unspecified: Secondary | ICD-10-CM

## 2011-07-12 LAB — BASIC METABOLIC PANEL
CO2: 28 mEq/L (ref 19–32)
Calcium: 9.5 mg/dL (ref 8.4–10.5)
Chloride: 105 mEq/L (ref 96–112)
Glucose, Bld: 101 mg/dL — ABNORMAL HIGH (ref 70–99)
Potassium: 4.4 mEq/L (ref 3.5–5.1)
Sodium: 139 mEq/L (ref 135–145)

## 2011-07-12 LAB — CBC WITH DIFFERENTIAL/PLATELET
Basophils Absolute: 0 10*3/uL (ref 0.0–0.1)
Basophils Relative: 0.4 % (ref 0.0–3.0)
Eosinophils Absolute: 0.1 10*3/uL (ref 0.0–0.7)
HCT: 38.3 % (ref 36.0–46.0)
Hemoglobin: 13 g/dL (ref 12.0–15.0)
Lymphs Abs: 1.9 10*3/uL (ref 0.7–4.0)
MCHC: 33.8 g/dL (ref 30.0–36.0)
MCV: 95.7 fl (ref 78.0–100.0)
Monocytes Absolute: 0.5 10*3/uL (ref 0.1–1.0)
Neutro Abs: 3.4 10*3/uL (ref 1.4–7.7)
RDW: 13 % (ref 11.5–14.6)

## 2011-07-17 NOTE — Progress Notes (Signed)
Quick Note:  Pt informed ______ 

## 2011-09-16 ENCOUNTER — Other Ambulatory Visit: Payer: Self-pay | Admitting: Family Medicine

## 2011-09-30 ENCOUNTER — Other Ambulatory Visit: Payer: Self-pay | Admitting: Family Medicine

## 2011-10-01 NOTE — Telephone Encounter (Signed)
Rx refill sent to pharmacy. 

## 2011-10-29 ENCOUNTER — Other Ambulatory Visit: Payer: Self-pay | Admitting: Family Medicine

## 2012-01-28 ENCOUNTER — Other Ambulatory Visit: Payer: Self-pay | Admitting: Family Medicine

## 2012-03-09 ENCOUNTER — Telehealth: Payer: Self-pay | Admitting: Family Medicine

## 2012-03-09 DIAGNOSIS — Z0000 Encounter for general adult medical examination without abnormal findings: Secondary | ICD-10-CM

## 2012-03-09 DIAGNOSIS — I10 Essential (primary) hypertension: Secondary | ICD-10-CM

## 2012-03-09 DIAGNOSIS — E7800 Pure hypercholesterolemia, unspecified: Secondary | ICD-10-CM

## 2012-03-09 NOTE — Telephone Encounter (Signed)
Pt needs lipid, BMP, and Hepatic-will order.  Go ahead and schedule.

## 2012-03-09 NOTE — Telephone Encounter (Signed)
Pt called req to get cholesterol lvls checked and ua. Pls order necessary labs and advise.

## 2012-03-10 NOTE — Telephone Encounter (Signed)
Labs ordered, please schedule pt and thank you

## 2012-03-11 NOTE — Telephone Encounter (Signed)
Called pt and schd labs as noted.

## 2012-04-15 ENCOUNTER — Other Ambulatory Visit (INDEPENDENT_AMBULATORY_CARE_PROVIDER_SITE_OTHER): Payer: 59

## 2012-04-15 DIAGNOSIS — I10 Essential (primary) hypertension: Secondary | ICD-10-CM

## 2012-04-15 DIAGNOSIS — Z0000 Encounter for general adult medical examination without abnormal findings: Secondary | ICD-10-CM

## 2012-04-15 DIAGNOSIS — E7800 Pure hypercholesterolemia, unspecified: Secondary | ICD-10-CM

## 2012-04-15 LAB — HEPATIC FUNCTION PANEL
Albumin: 3.9 g/dL (ref 3.5–5.2)
Alkaline Phosphatase: 51 U/L (ref 39–117)
Total Protein: 7.2 g/dL (ref 6.0–8.3)

## 2012-04-15 LAB — LIPID PANEL
Cholesterol: 127 mg/dL (ref 0–200)
LDL Cholesterol: 71 mg/dL (ref 0–99)
Triglycerides: 66 mg/dL (ref 0.0–149.0)
VLDL: 13.2 mg/dL (ref 0.0–40.0)

## 2012-04-15 LAB — POCT URINALYSIS DIPSTICK
Glucose, UA: NEGATIVE
Nitrite, UA: POSITIVE
Urobilinogen, UA: 0.2

## 2012-04-15 LAB — BASIC METABOLIC PANEL
BUN: 16 mg/dL (ref 6–23)
Creatinine, Ser: 0.8 mg/dL (ref 0.4–1.2)
GFR: 75.83 mL/min (ref >60.00)
Glucose, Bld: 86 mg/dL (ref 70–99)

## 2012-04-21 ENCOUNTER — Other Ambulatory Visit: Payer: Self-pay | Admitting: Family Medicine

## 2012-04-21 DIAGNOSIS — Z1231 Encounter for screening mammogram for malignant neoplasm of breast: Secondary | ICD-10-CM

## 2012-04-22 ENCOUNTER — Encounter: Payer: Self-pay | Admitting: Family Medicine

## 2012-04-22 ENCOUNTER — Ambulatory Visit (INDEPENDENT_AMBULATORY_CARE_PROVIDER_SITE_OTHER): Payer: 59 | Admitting: Family Medicine

## 2012-04-22 VITALS — BP 102/72 | Temp 98.2°F | Wt 165.0 lb

## 2012-04-22 DIAGNOSIS — K219 Gastro-esophageal reflux disease without esophagitis: Secondary | ICD-10-CM

## 2012-04-22 DIAGNOSIS — I10 Essential (primary) hypertension: Secondary | ICD-10-CM

## 2012-04-22 DIAGNOSIS — E7800 Pure hypercholesterolemia, unspecified: Secondary | ICD-10-CM

## 2012-04-22 DIAGNOSIS — R300 Dysuria: Secondary | ICD-10-CM

## 2012-04-22 LAB — POCT URINALYSIS DIPSTICK
Protein, UA: NEGATIVE
Spec Grav, UA: 1.015
Urobilinogen, UA: 0.2
pH, UA: 6

## 2012-04-22 MED ORDER — NITROFURANTOIN MONOHYD MACRO 100 MG PO CAPS: 100.0000 mg | ORAL_CAPSULE | Freq: Two times a day (BID) | ORAL | Status: DC

## 2012-04-22 MED ORDER — METOPROLOL TARTRATE 50 MG PO TABS: ORAL_TABLET | ORAL | Status: DC

## 2012-04-22 NOTE — Patient Instructions (Addendum)
Urinary Tract Infection Urinary tract infections (UTIs) can develop anywhere along your urinary tract. Your urinary tract is your body's drainage system for removing wastes and extra water. Your urinary tract includes two kidneys, two ureters, a bladder, and a urethra. Your kidneys are a pair of bean-shaped organs. Each kidney is about the size of your fist. They are located below your ribs, one on each side of your spine. CAUSES Infections are caused by microbes, which are microscopic organisms, including fungi, viruses, and bacteria. These organisms are so small that they can only be seen through a microscope. Bacteria are the microbes that most commonly cause UTIs. SYMPTOMS  Symptoms of UTIs may vary by age and gender of the patient and by the location of the infection. Symptoms in young women typically include a frequent and intense urge to urinate and a painful, burning feeling in the bladder or urethra during urination. Older women and men are more likely to be tired, shaky, and weak and have muscle aches and abdominal pain. A fever may mean the infection is in your kidneys. Other symptoms of a kidney infection include pain in your back or sides below the ribs, nausea, and vomiting. DIAGNOSIS To diagnose a UTI, your caregiver will ask you about your symptoms. Your caregiver also will ask to provide a urine sample. The urine sample will be tested for bacteria and white blood cells. White blood cells are made by your body to help fight infection. TREATMENT  Typically, UTIs can be treated with medication. Because most UTIs are caused by a bacterial infection, they usually can be treated with the use of antibiotics. The choice of antibiotic and length of treatment depend on your symptoms and the type of bacteria causing your infection. HOME CARE INSTRUCTIONS  If you were prescribed antibiotics, take them exactly as your caregiver instructs you. Finish the medication even if you feel better after you  have only taken some of the medication.  Drink enough water and fluids to keep your urine clear or pale yellow.  Avoid caffeine, tea, and carbonated beverages. They tend to irritate your bladder.  Empty your bladder often. Avoid holding urine for long periods of time.  Empty your bladder before and after sexual intercourse.  After a bowel movement, women should cleanse from front to back. Use each tissue only once. SEEK MEDICAL CARE IF:   You have back pain.  You develop a fever.  Your symptoms do not begin to resolve within 3 days. SEEK IMMEDIATE MEDICAL CARE IF:   You have severe back pain or lower abdominal pain.  You develop chills.  You have nausea or vomiting.  You have continued burning or discomfort with urination. MAKE SURE YOU:   Understand these instructions.  Will watch your condition.  Will get help right away if you are not doing well or get worse. Document Released: 01/30/2005 Document Revised: 10/22/2011 Document Reviewed: 05/31/2011 [employer] Patient Information 2013 [employer].  

## 2012-04-22 NOTE — Progress Notes (Signed)
  Subjective:    Patient ID: Wendy Sandoval, female    DOB: 1940-05-27, 71 y.o.   MRN: [id number]  HPI  Medical followup. Patient has history of hypertension, hyperlipidemia, GERD, and recurrent UTI. She had previous history of hematuria and has had full workup per urology. She's had several days of burning with urination and some urine frequency. Last week had a couple of clots of blood she described. She takes trimethoprim 100 mg daily for prophylaxis per urology. Denies fever or chills. No nausea or vomiting.  Patient has occasional dizziness with first standing. Blood pressure been running somewhat low side low 100s systolic. Medications reviewed. Compliant with all. Hyperlipidemia treated with simvastatin 40 mg daily. Recent labs reviewed with patient and all favorable. No recent chest pains. GERD symptoms controlled.  Past Medical History  Diagnosis Date  . HYPERCHOLESTEROLEMIA 07/07/2007  . HYPERTENSION 07/07/2007  . GERD 08/09/2008  . DIVERTICULOSIS, COLON 03/17/2007  . Fatty liver   . TMJ syndrome   . Headache   . Hiatal hernia   . DJD (degenerative joint disease)   . Esophageal stricture   . Depression   . IBS (irritable bowel syndrome)    Past Surgical History  Procedure Date  . Appendectomy   . Abdominal hysterectomy 1998  . Breast surgery     milk gland  . Lung surgery 1983    reports that she has never smoked. She has never used smokeless tobacco. She reports that she does not drink alcohol or use illicit drugs. family history includes Alcohol abuse in an unspecified family member; Arthritis in an unspecified family member; Diabetes in an unspecified family member; Heart disease in an unspecified family member; Hyperlipidemia in an unspecified family member; Hypertension in an unspecified family member; and Stroke in an unspecified family member. Allergies  Allergen Reactions  . Codeine Sulfate     REACTION: nausea  . Histamine     Anxious feeling      Review of  Systems  Constitutional: Negative for fever and chills.  Respiratory: Negative for cough and shortness of breath.   Cardiovascular: Negative for chest pain.  Gastrointestinal: Negative for abdominal pain.  Genitourinary: Positive for dysuria and urgency. Negative for decreased urine volume and difficulty urinating.       Objective:   Physical Exam  Constitutional: She appears well-developed and well-nourished.  Neck: Neck supple. No thyromegaly present.  Cardiovascular: Normal rate and regular rhythm.   Pulmonary/Chest: Effort normal and breath sounds normal. No respiratory distress. She has no wheezes. She has no rales.  Musculoskeletal: She exhibits no edema.          Assessment & Plan:  #1 dysuria. History of frequent UTI. Urine dipstick suspicious today. Urine culture sent. Refilled Macrobid one twice a day for 7 days pending culture results #2 hypertension. Well controlled. Reduce Lopressor to 50 mg one half tablet twice a day  #3 hyperlipidemia. Well controlled with simvastatin by recent labs. Check lipids yearly

## 2012-04-24 LAB — URINE CULTURE

## 2012-04-27 ENCOUNTER — Other Ambulatory Visit: Payer: Self-pay | Admitting: Family Medicine

## 2012-04-27 NOTE — Progress Notes (Signed)
Quick Note:  Pt informed and she is to start back on the preventive med her Urologist has her on. ______

## 2012-05-07 ENCOUNTER — Ambulatory Visit (HOSPITAL_COMMUNITY)
Admission: RE | Admit: 2012-05-07 | Discharge: 2012-05-07 | Disposition: A | Discharge status: Home / Self Care | Payer: Medicare Other | Source: Ambulatory Visit | Attending: Family Medicine | Admitting: Family Medicine

## 2012-05-07 DIAGNOSIS — Z1231 Encounter for screening mammogram for malignant neoplasm of breast: Secondary | ICD-10-CM

## 2012-05-13 ENCOUNTER — Other Ambulatory Visit: Payer: Self-pay | Admitting: Family Medicine

## 2012-05-13 DIAGNOSIS — R928 Other abnormal and inconclusive findings on diagnostic imaging of breast: Secondary | ICD-10-CM

## 2012-05-15 ENCOUNTER — Telehealth: Payer: Self-pay | Admitting: Family Medicine

## 2012-05-15 MED ORDER — NITROFURANTOIN MONOHYD MACRO 100 MG PO CAPS: 100.0000 mg | ORAL_CAPSULE | Freq: Two times a day (BID) | ORAL | Status: DC

## 2012-05-15 NOTE — Telephone Encounter (Signed)
May refill Macrobid one bid for 7 days.

## 2012-05-15 NOTE — Telephone Encounter (Signed)
Rx sent and patient is aware. 

## 2012-05-15 NOTE — Telephone Encounter (Signed)
Patient Information:  Caller Name: [name]  Phone: [phone]  Patient: Wendy Sandoval, Wendy Sandoval  Gender: Female  DOB: [birth year]  Age: 72 Years  PCP: Evelena Peat [facility])  Office Follow Up:  Does the office need to follow up with this patient?: Yes  Instructions For The Office: Patient request prescription. She cannot make an office appt. Please contact her  [phone].  Ph[facility] [place].  RN Note:  Patient is request prescription medication since it is Friday afternoon.Advised I would forward request  Symptoms  Reason For Call & Symptoms: Patient seen in the office 04/22/12 and evaluated by Dr. [name] for Blood in Urine. She was placed  Macrobid. Medication completed and symptoms went away.  Today, at lunch Symptoms returned.  Dr. McDermott/Urologist has her on Timpex 100mg  daily for this problem.  (she has appt with Urology 06/29/12.) She asking Dr. [name] for UTI script to get throught the weekend.  +frequency, +burning, urinating but small amounts,  Reviewed Health History In EMR: Yes  Reviewed Medications In EMR: Yes  Reviewed Allergies In EMR: Yes  Reviewed Surgeries / Procedures: No  Date of Onset of Symptoms: 05/15/2012  Guideline(s) Used:  Urination Pain - Female  Disposition Per Guideline:   See Today in Office  Reason For Disposition Reached:   > 2 UTIs in last year  Advice Given:  Fluids:   Drink extra fluids. Drink 8-10 glasses of liquids a day (Reason: to produce a dilute, non-irritating urine).  Cranberry Juice:   Some people think that drinking cranberry juice may help in fighting urinary tract infections. However, there is no good research that has ever proved this.  Dosage Cranberry Juice Cocktail: 8 oz (240 ml) twice a day.  Dosage 100% Cranberry Juice: 1 oz (30 ml) twice a day.  Caution: Do not drink more than 16 oz (480 ml). Here is the reason: too much cranberry juice can also be irritating to the bladder.  Warm Saline SITZ  Baths to Reduce Pain:  Sit in a warm saline bath for 20 minutes to cleanse the area and to reduce pain. Add 2 oz. of table salt or baking soda to a tub of water.  Call Back If:  You become worse.  Patient Refused Recommendation:  Patient Requests Prescription  Patient request prescription. She cannot make an office appt. Please contact her  [phone].  [facility] [place]

## 2012-05-20 ENCOUNTER — Ambulatory Visit
Admission: RE | Admit: 2012-05-20 | Discharge: 2012-05-20 | Disposition: A | Discharge status: Home / Self Care | Payer: 59 | Source: Ambulatory Visit | Attending: Family Medicine | Admitting: Family Medicine

## 2012-05-20 DIAGNOSIS — R928 Other abnormal and inconclusive findings on diagnostic imaging of breast: Secondary | ICD-10-CM

## 2012-05-28 ENCOUNTER — Other Ambulatory Visit: Payer: Self-pay | Admitting: Family Medicine

## 2012-09-08 ENCOUNTER — Other Ambulatory Visit: Payer: Self-pay | Admitting: Family Medicine

## 2012-10-05 ENCOUNTER — Telehealth: Payer: Self-pay | Admitting: Family Medicine

## 2012-10-05 NOTE — Telephone Encounter (Signed)
Patient Information:  Caller Name: Maeley  Phone: [phone]  Patient: Wendy Sandoval, Wendy Sandoval  Gender: Female  DOB: 1941/04/12  Age: 72 Years  PCP: Evelena Peat (Family Practice)  Office Follow Up:  Does the office need to follow up with this patient?: No  Instructions For The Office: N/A  RN Note:  Appointment made for 10/07/2012 at 09:45 with Dr. [name].  Symptoms  Reason For Call & Symptoms: Onset 10/05/2012 aching all over.  Tick bite 1 week ago and has had 13 over the past 6 weeks.  (6 larger ticks and 7 smaller ones.)  Reviewed Health History In EMR: Yes  Reviewed Medications In EMR: Yes  Reviewed Allergies In EMR: Yes  Reviewed Surgeries / Procedures: Yes  Date of Onset of Symptoms: 10/05/2012  Treatments Tried: APAP 2 tabs at 1200  Treatments Tried Worked: Yes  Guideline(s) Used:  Tick Bite  Disposition Per Guideline:   See Today in Office  Reason For Disposition Reached:   Patient wants to be seen  Advice Given:  Antibiotic Ointment:  Wash the wound and your hands with soap and water after removal to prevent catching any tick disease. Apply an over-the-counter antibiotic ointment (e.g., bacitracin) to the bite once.  Expected Course:  Tick bites normally do not itch or hurt. That is why they often go unnoticed.  Prevention with Insect Repellent - DEET:  DEET is a very effective tick repellent. It also repels mosquitoes and other bugs.  Apply to exposed areas of skin. Do not apply to eyes, mouth or irritated areas of skin. Remember to wash it off with soap and water when you return indoors.  Be certain to read the package instructions on any product that you use.  Patient Will Follow Care Advice:  YES  Appointment Scheduled:  10/07/2012 09:45:00 Appointment Scheduled Provider:  Evelena Peat [facility])

## 2012-10-07 ENCOUNTER — Encounter: Payer: Self-pay | Admitting: Family Medicine

## 2012-10-07 ENCOUNTER — Ambulatory Visit (INDEPENDENT_AMBULATORY_CARE_PROVIDER_SITE_OTHER): Payer: 59 | Admitting: Family Medicine

## 2012-10-07 VITALS — BP 140/72 | HR 62 | Temp 98.2°F | Resp 18 | Wt 163.0 lb

## 2012-10-07 DIAGNOSIS — W57XXXA Bitten or stung by nonvenomous insect and other nonvenomous arthropods, initial encounter: Secondary | ICD-10-CM

## 2012-10-07 DIAGNOSIS — M2550 Pain in unspecified joint: Secondary | ICD-10-CM

## 2012-10-07 NOTE — Patient Instructions (Addendum)
Lyme Disease  You may have been bitten by a tick and are to watch for the development of Lyme Disease. Lyme Disease is an infection that is caused by a bacteria The bacteria causing this disease is named Borreilia burgdorferi. If a tick is infected with this bacteria and then bites you, then Lyme Disease may occur. These ticks are carried by deer and rodents such as rabbits and mice and infest grassy as well as forested areas. Fortunately most tick bites do not cause Lyme Disease.   Lyme Disease is easier to prevent than to treat. First, covering your legs with clothing when walking in areas where ticks are possibly abundant will prevent their attachment because ticks tend to stay within inches of the ground. Second, using insecticides containing DEET can be applied on skin or clothing. Last, because it takes about 12 to 24 hours for the tick to transmit the disease after attachment to the human host, you should inspect your body for ticks twice a day when you are in areas where Lyme Disease is common. You must look thoroughly when searching for ticks. The Ixodes tick that carries Lyme Disease is very small. It is around the size of a sesame seed (picture of tick is not actual size). Removal is best done by grasping the tick by the head and pulling it out. Do not to squeeze the body of the tick. This could inject the infecting bacteria into the bite site. Wash the area of the bite with an antiseptic solution after removal.   Lyme Disease is a disease that may affect many body systems. Because of the small size of the biting tick, most people do not notice being bitten. The first sign of an infection is usually a round red rash that extends out from the center of the tick bite. The center of the lesion may be blood colored (hemorrhagic) or have tiny blisters (vesicular). Most lesions have bright red outer borders and partial central clearing. This rash may extend out many inches in diameter, and multiple lesions may  be present. Other symptoms such as fatigue, headaches, chills and fever, general achiness and swelling of lymph glands may also occur. If this first stage of the disease is left untreated, these symptoms may gradually resolve by themselves, or progressive symptoms may occur because of spread of infection to other areas of the body.   Follow up with your caregiver to have testing and treatment if you have a tick bite and you develop any of the above complaints. Your caregiver may recommend preventative (prophylactic) medications which kill bacteria (antibiotics). Once a diagnosis of Lyme Disease is made, antibiotic treatment is highly likely to cure the disease. Effective treatment of late stage Lyme Disease may require longer courses of antibiotic therapy.   MAKE SURE YOU:   · Understand these instructions.  · Will watch your condition.  · Will get help right away if you are not doing well or get worse.  Document Released: 07/29/2000 Document Revised: 07/15/2011 Document Reviewed: 09/30/2008  Exit[employer]® Patient Information ©2014 [employer].

## 2012-10-07 NOTE — Progress Notes (Signed)
  Subjective:    Patient ID: Wendy Sandoval, female    DOB: 08-02-1940, 72 y.o.   MRN: [id number]  HPI Tick bites over past 8-10 weeks.  Most were deer ticks.   No fever or chills.  No rash. More arthralgias than usual-knees, ankles mostly No headaches..    she has some fatigue but this mostly chronic. She does take simvastatin and wonders if this may be causing some of her arthralgias. She also has history of chronic lumbar stenosis and is seeing specialists for several years regarding that. Her chronic low back pain is unchanged  Past Medical History  Diagnosis Date  . HYPERCHOLESTEROLEMIA 07/07/2007  . HYPERTENSION 07/07/2007  . GERD 08/09/2008  . DIVERTICULOSIS, COLON 03/17/2007  . Fatty liver   . TMJ syndrome   . Headache(784.0)   . Hiatal hernia   . DJD (degenerative joint disease)   . Esophageal stricture   . Depression   . IBS (irritable bowel syndrome)    Past Surgical History  Procedure Laterality Date  . Appendectomy    . Abdominal hysterectomy  1998  . Breast surgery      milk gland  . Lung surgery  1983    reports that she has never smoked. She has never used smokeless tobacco. She reports that she does not drink alcohol or use illicit drugs. family history includes Alcohol abuse in an unspecified family member; Arthritis in an unspecified family member; Diabetes in an unspecified family member; Heart disease in an unspecified family member; Hyperlipidemia in an unspecified family member; Hypertension in an unspecified family member; and Stroke in an unspecified family member. Allergies  Allergen Reactions  . Codeine Sulfate     REACTION: nausea  . Histamine     Anxious feeling      Review of Systems  Constitutional: Positive for fatigue. Negative for fever, chills and appetite change.  Respiratory: Negative for cough.   Cardiovascular: Negative for chest pain.  Musculoskeletal: Positive for back pain and arthralgias.  Skin: Negative for rash.   Neurological: Negative for headaches.  Hematological: Negative for adenopathy.       Objective:   Physical Exam  Constitutional: She appears well-developed and well-nourished.  HENT:  Mouth/Throat: Oropharynx is clear and moist.  Neck: Neck supple.  Cardiovascular: Normal rate.   Pulmonary/Chest: Effort normal and breath sounds normal. No respiratory distress. She has no wheezes. She has no rales.  Lymphadenopathy:    She has no cervical adenopathy.  Skin: No rash noted.          Assessment & Plan:  Patient presents with multiple recent tick bites, mostly dear ticks. She has some arthralgias which we suspect are more likely related to osteoarthritis and possible statin related. She does not have any specific rash or other findings such as fever to suggest likely acute tick fever. We'll check Lyme antibodies.

## 2012-10-12 ENCOUNTER — Other Ambulatory Visit: Payer: Self-pay | Admitting: Family Medicine

## 2012-10-12 DIAGNOSIS — R922 Inconclusive mammogram: Secondary | ICD-10-CM

## 2012-10-12 NOTE — Progress Notes (Signed)
Quick Note:  Pt informed ______ 

## 2012-10-29 ENCOUNTER — Other Ambulatory Visit: Payer: Self-pay | Admitting: Family Medicine

## 2012-10-29 NOTE — Telephone Encounter (Signed)
Rx request to pharmacy/[facility]  

## 2012-11-11 ENCOUNTER — Ambulatory Visit
Admission: RE | Admit: 2012-11-11 | Discharge: 2012-11-11 | Disposition: A | Discharge status: Home / Self Care | Payer: Medicare Other | Source: Ambulatory Visit | Attending: Family Medicine | Admitting: Family Medicine

## 2012-11-11 DIAGNOSIS — R922 Inconclusive mammogram: Secondary | ICD-10-CM

## 2013-03-12 ENCOUNTER — Other Ambulatory Visit: Payer: Self-pay | Admitting: Family Medicine

## 2013-04-13 ENCOUNTER — Other Ambulatory Visit: Payer: Self-pay | Admitting: Family Medicine

## 2013-04-13 DIAGNOSIS — Z1231 Encounter for screening mammogram for malignant neoplasm of breast: Secondary | ICD-10-CM

## 2013-04-20 ENCOUNTER — Encounter: Payer: Self-pay | Admitting: Family Medicine

## 2013-04-20 ENCOUNTER — Ambulatory Visit (INDEPENDENT_AMBULATORY_CARE_PROVIDER_SITE_OTHER): Payer: Medicare Other | Admitting: Family Medicine

## 2013-04-20 VITALS — BP 130/80 | HR 70 | Temp 98.3°F | Ht 62.0 in | Wt 160.0 lb

## 2013-04-20 DIAGNOSIS — K219 Gastro-esophageal reflux disease without esophagitis: Secondary | ICD-10-CM

## 2013-04-20 DIAGNOSIS — F329 Major depressive disorder, single episode, unspecified: Secondary | ICD-10-CM

## 2013-04-20 DIAGNOSIS — Z23 Encounter for immunization: Secondary | ICD-10-CM

## 2013-04-20 DIAGNOSIS — E7800 Pure hypercholesterolemia, unspecified: Secondary | ICD-10-CM

## 2013-04-20 DIAGNOSIS — I10 Essential (primary) hypertension: Secondary | ICD-10-CM

## 2013-04-20 DIAGNOSIS — Z0000 Encounter for general adult medical examination without abnormal findings: Secondary | ICD-10-CM

## 2013-04-20 LAB — BASIC METABOLIC PANEL
BUN: 15 mg/dL (ref 6–23)
Chloride: 102 mEq/L (ref 96–112)
GFR: 86.94 mL/min (ref >60.00)
Potassium: 4.1 mEq/L (ref 3.5–5.1)
Sodium: 138 mEq/L (ref 135–145)

## 2013-04-20 LAB — HEPATIC FUNCTION PANEL
ALT: 22 U/L (ref 0–35)
Albumin: 4.3 g/dL (ref 3.5–5.2)
Bilirubin, Direct: 0 mg/dL (ref 0.0–0.3)
Total Protein: 7.4 g/dL (ref 6.0–8.3)

## 2013-04-20 LAB — LIPID PANEL
Cholesterol: 138 mg/dL (ref 0–200)
HDL: 41.2 mg/dL (ref >39.00)
LDL Cholesterol: 73 mg/dL (ref 0–99)
VLDL: 23.4 mg/dL (ref 0.0–40.0)

## 2013-04-20 NOTE — Progress Notes (Signed)
Subjective:    Patient ID: Wendy Sandoval, female    DOB: 01-14-1941, 72 y.o.   MRN: [id number]  HPI Patient seen for medical followup and Medicare wellness exam Her chronic problems include history of GERD, esophageal stricture, hypertension, hyperlipidemia, irritable bowel syndrome. She has had history of diverticulosis of colon but no associated bleed. Medications reviewed. Blood pressure been well controlled. No dizziness. No chest pains. Lipids treated with simvastatin. No history of CAD. No recent chest pains.  Flu vaccine already given. No history of shingles vaccine. Last tetanus unknown. Last colonoscopy 2008.  Past Medical History  Diagnosis Date  . HYPERCHOLESTEROLEMIA 07/07/2007  . HYPERTENSION 07/07/2007  . GERD 08/09/2008  . DIVERTICULOSIS, COLON 03/17/2007  . Fatty liver   . TMJ syndrome   . Headache(784.0)   . Hiatal hernia   . DJD (degenerative joint disease)   . Esophageal stricture   . Depression   . IBS (irritable bowel syndrome)    Past Surgical History  Procedure Laterality Date  . Appendectomy    . Abdominal hysterectomy  1998  . Breast surgery      milk gland  . Lung surgery  1983    reports that she has never smoked. She has never used smokeless tobacco. She reports that she does not drink alcohol or use illicit drugs. family history includes Alcohol abuse in an other family member; Arthritis in an other family member; Diabetes in an other family member; Heart disease in an other family member; Hyperlipidemia in an other family member; Hypertension in an other family member; Stroke in an other family member. Allergies  Allergen Reactions  . Codeine Sulfate     REACTION: nausea  . Histamine     Anxious feeling   1.  Risk factors based on Past Medical , Social, and Family history reviewed and as indicated above 2.  Limitations in physical activities stays very active. One fall during the past year. Stable on feet 3.  Depression/mood depression  recurrent in the past. Currently stable 4.  Hearing no major issues 5.  ADLs independent in all 6.  Cognitive function (orientation to time and place, language, writing, speech,memory) memory intact. Language and judgment intact 7.  Home Safety no issues 8.  Height, weight, and visual acuity. All stable 9.  Counseling discussed weight control. Check on coverage for shingles vaccine 10. Recommendation of preventive services. Shingles vaccine given. Confirm date of last tetanus. Continue with yearly flu vaccine 11. Labs based on risk factors lipid, hepatic, basic metabolic panel 12. Care Plan as above    Review of Systems  Constitutional: Negative for fever, activity change, appetite change, fatigue and unexpected weight change.  HENT: Negative for ear pain, hearing loss, sore throat and trouble swallowing.   Eyes: Negative for visual disturbance.  Respiratory: Negative for cough and shortness of breath.   Cardiovascular: Negative for chest pain and palpitations.  Gastrointestinal: Negative for abdominal pain, diarrhea, constipation and blood in stool.  Endocrine: Negative for polydipsia and polyuria.  Genitourinary: Negative for dysuria and hematuria.  Musculoskeletal: Negative for arthralgias, back pain and myalgias.  Skin: Negative for rash.  Neurological: Negative for dizziness, syncope and headaches.  Hematological: Negative for adenopathy.  Psychiatric/Behavioral: Negative for confusion and dysphoric mood.       Objective:   Physical Exam  Constitutional: She is oriented to person, place, and time. She appears well-developed and well-nourished.  HENT:  Head: Normocephalic and atraumatic.  Eyes: EOM are normal. Pupils are equal, round,  and reactive to light.  Neck: Normal range of motion. Neck supple. No thyromegaly present.  Cardiovascular: Normal rate, regular rhythm and normal heart sounds.   No murmur heard. Pulmonary/Chest: Breath sounds normal. No respiratory distress.  She has no wheezes. She has no rales.  Abdominal: Soft. Bowel sounds are normal. She exhibits no distension and no mass. There is no tenderness. There is no rebound and no guarding.  Musculoskeletal: Normal range of motion. She exhibits no edema.  Lymphadenopathy:    She has no cervical adenopathy.  Neurological: She is alert and oriented to person, place, and time. She displays normal reflexes. No cranial nerve deficit.  Skin: No rash noted.  Psychiatric: She has a normal mood and affect. Her behavior is normal. Judgment and thought content normal.          Assessment & Plan:  #1 health maintenance. Flu vaccine are given. Shingles vaccine given. She has no contraindications. Confirm date of last tetanus. #2 hypertension. Well controlled. Continue current medications. Check basic metabolic panel #3 hyperlipidemia. Check lipid and hepatic panel #4 GERD which is stable and controlled on Nexium.

## 2013-04-20 NOTE — Patient Instructions (Signed)
Check on insurance coverage for shingles vaccine Continue with yearly flu vaccine.

## 2013-04-20 NOTE — Progress Notes (Signed)
Pre visit review using our clinic review tool, if applicable. No additional management support is needed unless otherwise documented below in the visit note. 

## 2013-04-20 NOTE — Addendum Note (Signed)
Addended by: Shelby Dubin E on: 04/20/2013 10:14 AM   Modules accepted: Orders

## 2013-04-27 ENCOUNTER — Other Ambulatory Visit: Payer: Self-pay | Admitting: Family Medicine

## 2013-05-03 ENCOUNTER — Telehealth: Payer: Self-pay | Admitting: Gastroenterology

## 2013-05-04 NOTE — Telephone Encounter (Signed)
Last EGD 05/23/10 with - HP, no Recall year in letter sent. Last COLON 03/17/2007 that was normal except for diverticulosis from Descending to Sigmoid; notes stating sigmoid area with pelvic adhesions and difficult to even pass Pediatric scope. No letters for this procedure are in the system. Spoke with pt to inform her of these dates and since I don't have a Recall, they both should be 10 years. Pt responded normally she had  5 year recalls. Sent for chart which offered nothing new to me for recalls; will leave for Dr Jarold Motto. Dr Jarold Motto, please advise for procedures. Thanks

## 2013-05-07 NOTE — Telephone Encounter (Signed)
Not needed

## 2013-05-20 ENCOUNTER — Ambulatory Visit (HOSPITAL_COMMUNITY): Payer: Medicare Other

## 2013-05-25 ENCOUNTER — Ambulatory Visit (HOSPITAL_COMMUNITY)
Admission: RE | Admit: 2013-05-25 | Discharge: 2013-05-25 | Disposition: A | Discharge status: Home / Self Care | Payer: Medicare Other | Source: Ambulatory Visit | Attending: Family Medicine | Admitting: Family Medicine

## 2013-05-25 DIAGNOSIS — Z1231 Encounter for screening mammogram for malignant neoplasm of breast: Secondary | ICD-10-CM

## 2013-05-31 ENCOUNTER — Other Ambulatory Visit: Payer: Self-pay | Admitting: Family Medicine

## 2013-06-16 NOTE — Telephone Encounter (Signed)
Late entry   05/07/13  Dr Sharlett Iles returned chart with a note to schedule pt later in the year with Dr Henrene Pastor. Informed pt that we will call her later in the year for an appt with Dr Henrene Pastor and he will decide on whether she has a procedure. Pt stated understanding.

## 2013-09-09 ENCOUNTER — Other Ambulatory Visit: Payer: Self-pay | Admitting: Family Medicine

## 2013-09-29 ENCOUNTER — Ambulatory Visit: Payer: Medicare Other | Admitting: Family Medicine

## 2013-10-14 ENCOUNTER — Encounter: Payer: Self-pay | Admitting: Family Medicine

## 2013-10-14 ENCOUNTER — Ambulatory Visit (INDEPENDENT_AMBULATORY_CARE_PROVIDER_SITE_OTHER): Payer: Medicare Other | Admitting: Family Medicine

## 2013-10-14 ENCOUNTER — Telehealth: Payer: Self-pay | Admitting: Family Medicine

## 2013-10-14 VITALS — BP 126/72 | HR 66 | Wt 160.0 lb

## 2013-10-14 DIAGNOSIS — W57XXXA Bitten or stung by nonvenomous insect and other nonvenomous arthropods, initial encounter: Secondary | ICD-10-CM

## 2013-10-14 DIAGNOSIS — K219 Gastro-esophageal reflux disease without esophagitis: Secondary | ICD-10-CM

## 2013-10-14 DIAGNOSIS — T148 Other injury of unspecified body region: Secondary | ICD-10-CM

## 2013-10-14 DIAGNOSIS — E7800 Pure hypercholesterolemia, unspecified: Secondary | ICD-10-CM

## 2013-10-14 DIAGNOSIS — I10 Essential (primary) hypertension: Secondary | ICD-10-CM

## 2013-10-14 NOTE — Telephone Encounter (Signed)
Relevant patient education mailed to patient.  

## 2013-10-14 NOTE — Progress Notes (Signed)
Pre visit review using our clinic review tool, if applicable. No additional management support is needed unless otherwise documented below in the visit note. 

## 2013-10-14 NOTE — Progress Notes (Signed)
   Subjective:    Patient ID: Wendy Sandoval, female    DOB: 1940-06-04, 73 y.o.   MRN: [id number]  HPI Routine medical followup. Patient has history of hypertension, hyperlipidemia, GERD, history of esophageal stricture, irritable bowel syndrome and recurrent UTI. She remains on low-dose Keflex per urology. No recent UTI symptoms. Medications reviewed. Compliant with all. Blood pressure stable. No dizziness. No headaches. No chest pains. Lipids were checked last December these were reviewed and well controlled.  Multiple recent tick bites. She's had both deer ticks and regular wood ticks. No recent fever. No headache. She's had only minimal local irritation at site of bite but no concerning rashes  Past Medical History  Diagnosis Date  . HYPERCHOLESTEROLEMIA 07/07/2007  . HYPERTENSION 07/07/2007  . GERD 08/09/2008  . DIVERTICULOSIS, COLON 03/17/2007  . Fatty liver   . TMJ syndrome   . Headache(784.0)   . Hiatal hernia   . DJD (degenerative joint disease)   . Esophageal stricture   . Depression   . IBS (irritable bowel syndrome)    Past Surgical History  Procedure Laterality Date  . Appendectomy    . Abdominal hysterectomy  1998  . Breast surgery      milk gland  . Lung surgery  1983    reports that she has never smoked. She has never used smokeless tobacco. She reports that she does not drink alcohol or use illicit drugs. family history includes Alcohol abuse in an other family member; Arthritis in an other family member; Diabetes in an other family member; Heart disease in an other family member; Hyperlipidemia in an other family member; Hypertension in an other family member; Stroke in an other family member. Allergies  Allergen Reactions  . Codeine Sulfate     REACTION: nausea  . Histamine     Anxious feeling      Review of Systems  Constitutional: Positive for fatigue.  Eyes: Negative for visual disturbance.  Respiratory: Negative for cough, chest tightness, shortness  of breath and wheezing.   Cardiovascular: Negative for chest pain, palpitations and leg swelling.  Skin: Positive for rash.  Neurological: Negative for dizziness, seizures, syncope, weakness, light-headedness and headaches.       Objective:   Physical Exam  Constitutional: She appears well-developed and well-nourished.  HENT:  Right Ear: External ear normal.  Left Ear: External ear normal.  Mouth/Throat: Oropharynx is clear and moist.  Neck: Neck supple. No thyromegaly present.  Cardiovascular: Normal rate and regular rhythm.   Pulmonary/Chest: Effort normal and breath sounds normal. No respiratory distress. She has no wheezes. She has no rales.  Musculoskeletal: She exhibits no edema.  Skin: Rash noted.  Patient is a couple nonspecific small punctate papules left lower leg. No pustules          Assessment & Plan:  #1 hypertension. Well controlled. Continue current medications. Check basic metabolic panel at followup #2 hyperlipidemia. Continue simvastatin. Check lipids at followup in 6 months #3 multiple recent tick bites. Reviewed signs and symptoms of Lyme disease, [place] spotted fever, and STAR. At present, no concerning symptoms

## 2013-10-26 ENCOUNTER — Ambulatory Visit: Payer: Medicare Other | Admitting: Internal Medicine

## 2013-12-21 ENCOUNTER — Ambulatory Visit (INDEPENDENT_AMBULATORY_CARE_PROVIDER_SITE_OTHER): Payer: Medicare Other | Admitting: Internal Medicine

## 2013-12-21 ENCOUNTER — Encounter: Payer: Self-pay | Admitting: Internal Medicine

## 2013-12-21 VITALS — BP 142/74 | HR 64 | Ht 61.0 in | Wt 156.0 lb

## 2013-12-21 DIAGNOSIS — K5730 Diverticulosis of large intestine without perforation or abscess without bleeding: Secondary | ICD-10-CM

## 2013-12-21 DIAGNOSIS — K7689 Other specified diseases of liver: Secondary | ICD-10-CM

## 2013-12-21 DIAGNOSIS — K222 Esophageal obstruction: Secondary | ICD-10-CM

## 2013-12-21 DIAGNOSIS — K219 Gastro-esophageal reflux disease without esophagitis: Secondary | ICD-10-CM

## 2013-12-21 DIAGNOSIS — K760 Fatty (change of) liver, not elsewhere classified: Secondary | ICD-10-CM

## 2013-12-21 NOTE — Patient Instructions (Signed)
Please follow up with Dr. Perry as needed 

## 2013-12-21 NOTE — Progress Notes (Signed)
HISTORY OF PRESENT ILLNESS:  Wendy Sandoval is a 73 y.o. female with multiple medical problems as listed below. She is a previous patient of Dr. Verl Blalock until the time of his retirement. However, she has not been seen in greater than 3 years. She presents today to establish care. As well, there was the question regarding the need for any endoscopic procedures. The patient has a history of GERD complicated by peptic stricture for which she underwent esophageal dilation in January 2012. She is also known to have fatty liver. Next, she is undergone several colonoscopies in the past without neoplasia. Most recent examination November 2008. The examination was very difficult to 2 diverticulosis and fixed sigmoid colon. She has had multiple prior abdominal and pelvic surgeries. Examination was negative for neoplasia and complete to the cecum (without cecal tip being visualized). Review of laboratories from December 2014 were unremarkable. Patient reports that her reflux symptoms are controlled with Nexium 40 mg 4 times weekly. No recurrent dysphagia. She has had problems with recurrent urinary tract infections and is on chronic antibiotic suppressive therapy. She does report occasional mid abdominal discomfort which he relates to diet. She has had this for years. Her appetite and weight are stable. Bowel habits regular. No bleeding. She does tell me that her husband died from pancreatic cancer about 9 years ago. She is always a concern with any symptom, that she may have cancer.  REVIEW OF SYSTEMS:  All non-GI ROS negative except for arthritis, back pain, fatigue, itching, muscle cramps, urinary leakage  Past Medical History  Diagnosis Date  . HYPERCHOLESTEROLEMIA 07/07/2007  . HYPERTENSION 07/07/2007  . GERD 08/09/2008  . DIVERTICULOSIS, COLON 03/17/2007  . Fatty liver   . TMJ syndrome   . Headache(784.0)   . Hiatal hernia   . DJD (degenerative joint disease)   . Esophageal stricture   .  Depression   . IBS (irritable bowel syndrome)   . Schatzki's ring   . Chronic UTI (urinary tract infection)     recurrent    Past Surgical History  Procedure Laterality Date  . Appendectomy    . Abdominal hysterectomy  1998  . Breast surgery      milk gland  . Lung surgery  1983    Social History Wendy Sandoval  reports that she has never smoked. She has never used smokeless tobacco. She reports that she does not drink alcohol or use illicit drugs.  family history includes Alcohol abuse in an other family member; Arthritis in an other family member; Diabetes in an other family member; Heart disease in an other family member; Hyperlipidemia in an other family member; Hypertension in an other family member; Stroke in an other family member.  Allergies  Allergen Reactions  . Codeine Sulfate     REACTION: nausea  . Histamine     Anxious feeling       PHYSICAL EXAMINATION: Vital signs: BP 142/74  Pulse 64  Ht 5\' 1"  (1.549 m)  Wt 156 lb (70.761 kg)  BMI 29.49 kg/m2 General: Well-developed, well-nourished, no acute distress HEENT: Sclerae are anicteric, conjunctiva pink. Oral mucosa intact Lungs: Clear Heart: Regular Abdomen: soft, nontender, nondistended, no obvious ascites, no peritoneal signs, normal bowel sounds. No organomegaly. Multiple surgical incisions well-healed Extremities: No edema Psychiatric: alert and oriented x3. Cooperative     ASSESSMENT:  #1. GERD complicated by peptic stricture. Asymptomatic on PPI post dilation #2. Diverticulosis with colonic stenosis. Previous colonoscopies without neoplasia. Last exam 2008 #3. General  medical problems   PLAN:  #1. Reflux precautions #2. Continue PPI #3. Repeat EGD for recurrent dysphagia. #4. Screening colonoscopy up-to-date. No further routine screening recommended due to age. As well, given colonic anatomy, high risk for perforation or other complication #5. Resume care with PCP. GI followup as  needed

## 2014-02-10 ENCOUNTER — Encounter: Payer: Self-pay | Admitting: Gastroenterology

## 2014-03-07 ENCOUNTER — Telehealth: Payer: Self-pay | Admitting: Family Medicine

## 2014-03-07 MED ORDER — LISINOPRIL 10 MG PO TABS: ORAL_TABLET | ORAL | Status: DC

## 2014-03-07 NOTE — Telephone Encounter (Signed)
[id number] - [place],  - [place] is requesting re-fill on lisinopril (PRINIVIL,ZESTRIL) 10 MG tablet

## 2014-03-07 NOTE — Telephone Encounter (Signed)
Rx sent to pharmacy   

## 2014-04-15 ENCOUNTER — Encounter: Payer: Self-pay | Admitting: Family Medicine

## 2014-04-15 ENCOUNTER — Ambulatory Visit (INDEPENDENT_AMBULATORY_CARE_PROVIDER_SITE_OTHER): Payer: Medicare Other | Admitting: Family Medicine

## 2014-04-15 VITALS — BP 136/78 | HR 60 | Temp 97.5°F | Wt 157.0 lb

## 2014-04-15 DIAGNOSIS — E7800 Pure hypercholesterolemia, unspecified: Secondary | ICD-10-CM

## 2014-04-15 DIAGNOSIS — I10 Essential (primary) hypertension: Secondary | ICD-10-CM

## 2014-04-15 DIAGNOSIS — R1011 Right upper quadrant pain: Secondary | ICD-10-CM

## 2014-04-15 LAB — BASIC METABOLIC PANEL
BUN: 13 mg/dL (ref 6–23)
CALCIUM: 9.7 mg/dL (ref 8.4–10.5)
CO2: 29 meq/L (ref 19–32)
CREATININE: 0.6 mg/dL (ref 0.4–1.2)
Chloride: 105 mEq/L (ref 96–112)
GFR: 97.92 mL/min (ref >60.00)
GLUCOSE: 96 mg/dL (ref 70–99)
Potassium: 4.6 mEq/L (ref 3.5–5.1)
Sodium: 138 mEq/L (ref 135–145)

## 2014-04-15 LAB — HEPATIC FUNCTION PANEL
ALBUMIN: 3.9 g/dL (ref 3.5–5.2)
ALK PHOS: 54 U/L (ref 39–117)
ALT: 17 U/L (ref 0–35)
AST: 19 U/L (ref 0–37)
BILIRUBIN DIRECT: 0 mg/dL (ref 0.0–0.3)
Total Bilirubin: 0.8 mg/dL (ref 0.2–1.2)
Total Protein: 6.9 g/dL (ref 6.0–8.3)

## 2014-04-15 LAB — LIPID PANEL
CHOL/HDL RATIO: 3
Cholesterol: 128 mg/dL (ref 0–200)
HDL: 44.4 mg/dL (ref >39.00)
LDL Cholesterol: 66 mg/dL (ref 0–99)
NONHDL: 83.6
Triglycerides: 87 mg/dL (ref 0.0–149.0)
VLDL: 17.4 mg/dL (ref 0.0–40.0)

## 2014-04-15 LAB — CBC WITH DIFFERENTIAL/PLATELET
BASOS ABS: 0 10*3/uL (ref 0.0–0.1)
Basophils Relative: 0.4 % (ref 0.0–3.0)
Eosinophils Absolute: 0.2 10*3/uL (ref 0.0–0.7)
Eosinophils Relative: 2.2 % (ref 0.0–5.0)
HEMATOCRIT: 41.8 % (ref 36.0–46.0)
HEMOGLOBIN: 14.1 g/dL (ref 12.0–15.0)
LYMPHS ABS: 1.9 10*3/uL (ref 0.7–4.0)
Lymphocytes Relative: 28.6 % (ref 12.0–46.0)
MCHC: 33.9 g/dL (ref 30.0–36.0)
MCV: 93.7 fl (ref 78.0–100.0)
MONOS PCT: 6.5 % (ref 3.0–12.0)
Monocytes Absolute: 0.4 10*3/uL (ref 0.1–1.0)
NEUTROS ABS: 4.2 10*3/uL (ref 1.4–7.7)
Neutrophils Relative %: 62.3 % (ref 43.0–77.0)
Platelets: 256 10*3/uL (ref 150.0–400.0)
RBC: 4.46 Mil/uL (ref 3.87–5.11)
RDW: 13 % (ref 11.5–15.5)
WBC: 6.8 10*3/uL (ref 4.0–10.5)

## 2014-04-15 NOTE — Progress Notes (Signed)
Pre visit review using our clinic review tool, if applicable. No additional management support is needed unless otherwise documented below in the visit note. 

## 2014-04-15 NOTE — Progress Notes (Signed)
Subjective:    Patient ID: Wendy Sandoval, female    DOB: 07-03-1940, 73 y.o.   MRN: [id number]  HPI Patient is seen for routine medical follow-up. She has history of recurrent UTIs and is followed by urology for this. She has irritable bowel syndrome, hyperlipidemia, hypertension, GERD, history of chronic fatty liver changes. She's not had recent dysphagia and GERD symptoms are stable. She was switched to generic Nexium because of insurance changes. Compliant with medications. No dizziness. No chest pains. She has some fatigue issues. She is caring for her 32 year old mother and attributes some of her issues to that.  She's had several months of somewhat poorly localized upper abdominal pain radiating toward her right upper quadrant and occasionally toward her right shoulder. No clear exacerbating factors. Pain is often unknown. No alleviating factors. She has had slightly diminished appetite and mild weight loss. No stool changes. Previous EGD 2012. She's had previous esophageal dilatation but denies any recent dysphagia  Past Medical History  Diagnosis Date  . HYPERCHOLESTEROLEMIA 07/07/2007  . HYPERTENSION 07/07/2007  . GERD 08/09/2008  . DIVERTICULOSIS, COLON 03/17/2007  . Fatty liver   . TMJ syndrome   . Headache(784.0)   . Hiatal hernia   . DJD (degenerative joint disease)   . Esophageal stricture   . Depression   . IBS (irritable bowel syndrome)   . Schatzki's ring   . Chronic UTI (urinary tract infection)     recurrent   Past Surgical History  Procedure Laterality Date  . Appendectomy    . Abdominal hysterectomy  1998  . Breast surgery      milk gland  . Lung surgery  1983    reports that she has never smoked. She has never used smokeless tobacco. She reports that she does not drink alcohol or use illicit drugs. family history includes Alcohol abuse in an other family member; Arthritis in an other family member; Diabetes in an other family member; Heart disease in an other  family member; Hyperlipidemia in an other family member; Hypertension in an other family member; Stroke in an other family member. Allergies  Allergen Reactions  . Codeine Sulfate     REACTION: nausea  . Histamine     Anxious feeling      Review of Systems  Constitutional: Positive for appetite change, fatigue and unexpected weight change. Negative for fever and chills.  HENT: Negative for trouble swallowing.   Respiratory: Negative for cough and shortness of breath.   Cardiovascular: Negative for chest pain, palpitations and leg swelling.  Gastrointestinal: Positive for abdominal pain. Negative for nausea, vomiting, diarrhea, constipation, blood in stool and abdominal distention.  Neurological: Negative for dizziness.       Objective:   Physical Exam  Constitutional: She appears well-developed and well-nourished.  Neck: Neck supple. No thyromegaly present.  Cardiovascular: Normal rate and regular rhythm.  Exam reveals no gallop.   Pulmonary/Chest: Effort normal and breath sounds normal. No respiratory distress. She has no wheezes. She has no rales.  Abdominal: Soft. Bowel sounds are normal. She exhibits no distension and no mass. There is tenderness. There is no rebound and no guarding.  Mild tenderness right upper quadrant to deep palpation. No masses. No hepatomegaly. No guarding or rebound.  Musculoskeletal: She exhibits no edema.          Assessment & Plan:  #1 hypertension. Stable. Continue current medications #2 history of GERD stable. Continue Nexium and will switch over to generic #3 hyperlipidemia. Check lipid and  hepatic panel. Continue simvastatin #4 abdominal pain mostly upper abdominal right upper quadrant. She's had only mild weight loss. History of fatty liver changes. Set up ultrasound to further assess. Also checking labs as above.

## 2014-04-22 ENCOUNTER — Other Ambulatory Visit: Payer: Self-pay | Admitting: Family Medicine

## 2014-04-25 ENCOUNTER — Other Ambulatory Visit: Payer: Self-pay | Admitting: Family Medicine

## 2014-04-27 ENCOUNTER — Other Ambulatory Visit: Payer: Self-pay | Admitting: Family Medicine

## 2014-05-24 ENCOUNTER — Telehealth: Payer: Self-pay | Admitting: Family Medicine

## 2014-05-24 MED ORDER — OMEPRAZOLE 40 MG PO CPDR: 40.0000 mg | DELAYED_RELEASE_CAPSULE | Freq: Every day | ORAL | Status: DC

## 2014-05-24 NOTE — Telephone Encounter (Signed)
Omeprazole 40 mg sent to pharmacy.

## 2014-05-24 NOTE — Telephone Encounter (Signed)
Ok to switch 

## 2014-05-24 NOTE — Telephone Encounter (Signed)
Pharm called bc pt's insurance will no longer cover NEXIUM 40 MG capsule.  Need generic omeprazole 40 mg.   [facility]

## 2014-05-24 NOTE — Telephone Encounter (Signed)
OK to switch 

## 2014-06-20 ENCOUNTER — Other Ambulatory Visit: Payer: Self-pay | Admitting: Family Medicine

## 2014-06-20 DIAGNOSIS — Z1231 Encounter for screening mammogram for malignant neoplasm of breast: Secondary | ICD-10-CM

## 2014-06-23 ENCOUNTER — Ambulatory Visit (HOSPITAL_COMMUNITY): Payer: Self-pay

## 2014-06-24 DIAGNOSIS — J069 Acute upper respiratory infection, unspecified: Secondary | ICD-10-CM | POA: Diagnosis not present

## 2014-06-30 ENCOUNTER — Ambulatory Visit (HOSPITAL_COMMUNITY)
Admission: RE | Admit: 2014-06-30 | Discharge: 2014-06-30 | Disposition: A | Discharge status: Home / Self Care | Payer: 59 | Source: Ambulatory Visit | Attending: Family Medicine | Admitting: Family Medicine

## 2014-06-30 DIAGNOSIS — Z1231 Encounter for screening mammogram for malignant neoplasm of breast: Secondary | ICD-10-CM | POA: Diagnosis not present

## 2014-07-22 DIAGNOSIS — D239 Other benign neoplasm of skin, unspecified: Secondary | ICD-10-CM | POA: Diagnosis not present

## 2014-07-22 DIAGNOSIS — L570 Actinic keratosis: Secondary | ICD-10-CM | POA: Diagnosis not present

## 2014-07-22 DIAGNOSIS — D0460 Carcinoma in situ of skin of unspecified upper limb, including shoulder: Secondary | ICD-10-CM | POA: Diagnosis not present

## 2014-07-22 DIAGNOSIS — D0461 Carcinoma in situ of skin of right upper limb, including shoulder: Secondary | ICD-10-CM | POA: Diagnosis not present

## 2014-08-18 DIAGNOSIS — D0460 Carcinoma in situ of skin of unspecified upper limb, including shoulder: Secondary | ICD-10-CM | POA: Diagnosis not present

## 2014-08-18 DIAGNOSIS — C4492 Squamous cell carcinoma of skin, unspecified: Secondary | ICD-10-CM

## 2014-08-18 DIAGNOSIS — D0461 Carcinoma in situ of skin of right upper limb, including shoulder: Secondary | ICD-10-CM | POA: Diagnosis not present

## 2014-08-18 HISTORY — DX: Squamous cell carcinoma of skin, unspecified: C44.92

## 2014-09-06 ENCOUNTER — Other Ambulatory Visit: Payer: Self-pay | Admitting: Family Medicine

## 2014-11-01 ENCOUNTER — Ambulatory Visit (INDEPENDENT_AMBULATORY_CARE_PROVIDER_SITE_OTHER): Payer: Medicare Other | Admitting: Family Medicine

## 2014-11-01 ENCOUNTER — Telehealth: Payer: Self-pay | Admitting: Family Medicine

## 2014-11-01 ENCOUNTER — Encounter: Payer: Self-pay | Admitting: Family Medicine

## 2014-11-01 VITALS — BP 158/76 | HR 65 | Temp 99.0°F | Wt 159.0 lb

## 2014-11-01 DIAGNOSIS — I10 Essential (primary) hypertension: Secondary | ICD-10-CM | POA: Diagnosis not present

## 2014-11-01 DIAGNOSIS — R5383 Other fatigue: Secondary | ICD-10-CM | POA: Diagnosis not present

## 2014-11-01 DIAGNOSIS — F32A Depression, unspecified: Secondary | ICD-10-CM

## 2014-11-01 DIAGNOSIS — R0602 Shortness of breath: Secondary | ICD-10-CM | POA: Diagnosis not present

## 2014-11-01 DIAGNOSIS — F329 Major depressive disorder, single episode, unspecified: Secondary | ICD-10-CM | POA: Diagnosis not present

## 2014-11-01 DIAGNOSIS — R002 Palpitations: Secondary | ICD-10-CM

## 2014-11-01 NOTE — Telephone Encounter (Signed)
Patient Name: Wendy Sandoval  DOB: 1940/07/09    Initial Comment Caller states her BP is running low and her pulse rate is low. She is feeling tired and has shortness of breath.    Nurse Assessment  Nurse: Mallie Mussel, RN, Alveta Heimlich Date/Time Eilene Ghazi Time): 11/01/2014 8:32:48 AM  Confirm and document reason for call. If symptomatic, describe symptoms. ---Caller states that she has SOB with exertion. Her BP and pulse has been low. BP is 146/85 pulse is 74. She is still feeling a little weak and gets tired easily.  Has the patient traveled out of the country within the last 30 days? ---No  Does the patient require triage? ---Yes  Related visit to physician within the last 2 weeks? ---No  Does the PT have any chronic conditions? (i.e. diabetes, asthma, etc.) ---Yes  List chronic conditions. ---Hypercholesterolemia, HTN     Guidelines    Guideline Title Affirmed Question Affirmed Notes  Breathing Difficulty [1] MILD difficulty breathing (e.g., minimal/no SOB at rest, SOB with walking, pulse <100) AND [2] NEW-onset or WORSE than normal    Final Disposition User   See Physician within 4 Hours (or PCP triage) Mallie Mussel, RN, [place] remembered she has to take her son to therapy this morning, she cannot be seen within the 4 hours time recommended. She can be seen later today. Advised her that I will send the information to the office and someone will be calling her back. She states she can be reached on her cell number which is [phone]

## 2014-11-01 NOTE — Patient Instructions (Signed)
Check some basic labs today  You have a tremendous amount to deal with. I think depression could be causing some of your symptoms such as the fatigue, crying. I suggest you see one of our counselors from behavioral health- call for visit  Your blood pressure is actually high in office not low  If you have new or worsening symptoms please see [name] back but otherwise, let's get the labs, get you plugged in with behavioral health, and have you see Dr. Elease Hashimoto in 1-2 weeks.

## 2014-11-01 NOTE — Telephone Encounter (Signed)
Pt can not be seen this morning.  She said she feels fine just a little fatigue and mild sob.  She states her bp has been running low.  No other symptoms.  Appt scheduled with Dr Yong Channel at 3:45

## 2014-11-01 NOTE — Progress Notes (Signed)
Garret Reddish, MD  Subjective:  Wendy Sandoval is a 74 y.o. year old very pleasant female patient who presents with:  Fatigue/stress/crying spells Shortness of breath/palpitations/chest pain/hypertension but lower BP at home  For at least 3 months has experienced:  1. Low energy. No increased intensity over last week but states feels a little more fatigued. This is the primary reason patient came in today- due to persistent fatigue  -does admit Feels like she wants to cry a lot.   -admits to a lot of stress:   -Taking care of mother for 15 years, husband passed 10 years ago.   -Caring for son who cannot drive after recent injury 2. If she bends over, feels somewhat faint, mild shortness of breath and will feel some palpitations. No exertional shortness of breath or palpitations. Feels like her heart is "flipping. She was seen by cardiology 5 years ago and apparently had a reassuring stress test for chest pain and palpitations.  3. Low blood pressure- Started taking her blood pressure due to the fatigue and it has been lower than normal for her 114/61 or 110/58 pulse in the 50s usually. This AM 146/97.  4. Longer than 4 months- Does have occasional chest discomfort infrequently such as monthlywhich she attributes to GERD. Nonexertional. Not relieved by rest. Worse after meals. No chest pain or shortness of breath with exertion.   ROS- no diaphoresis, nausea, left arm or neck pain. Does state prefers to sleep on 1 pillow instead of completely flat as feels mildly short of breath.   Past Medical History- depression noted in 2009 but patient denies every being treated, hypertension, GERD, HLD, IBS, TMJ  Medications- reviewed and updated Current Outpatient Prescriptions  Medication Sig Dispense Refill  . aspirin 325 MG tablet Take 325 mg by mouth daily.      . cholecalciferol (VITAMIN D) 1000 UNITS tablet Take 1,000 Units by mouth 2 (two) times daily.     . fish oil-omega-3 fatty acids 1000  MG capsule Take 1,200 mg by mouth daily.     . hydrochlorothiazide (HYDRODIURIL) 25 MG tablet TAKE 1 TABLET EVERY DAY 30 tablet 11  . lisinopril (PRINIVIL,ZESTRIL) 10 MG tablet TAKE 1 TABLET BY MOUTH DAILY 30 tablet 5  . metoprolol (LOPRESSOR) 50 MG tablet TAKE 1/2 TABLET BY MOUTH TWICE DAILY 30 tablet 11  . NEXIUM 40 MG capsule TAKE 1 CAPSULE BY MOUTH DAILY BEFORE BREAKFAST 90 capsule 3  . omeprazole (PRILOSEC) 40 MG capsule Take 1 capsule (40 mg total) by mouth daily. 90 capsule 1  . simvastatin (ZOCOR) 40 MG tablet TAKE 1 TABLET BY MOUTH DAILY AT BEDTIME 90 tablet 3  . cephALEXin (KEFLEX) 250 MG capsule Take 250 mg by mouth daily.      Objective: BP 158/76 mmHg  Pulse 65  Temp(Src) 99 F (37.2 C)  Wt 159 lb (72.122 kg)  97% sats  Gen: NAD, resting comfortably in chair CV: RRR no murmurs rubs or gallops No chest wall discomfort No shortness of breath when walking down hall, appears comfortable Lungs: CTAB no crackles, wheeze, rhonchi Abdomen: soft/nontender/nondistended/normal bowel sounds. Ext: no edema Skin: warm, dry, no rash Neuro: normal gait   EKG: Rate 64, NSR, normal axis, normal intervals, no hypertrophy, no st or t wave changes   Assessment/Plan:  Fatigue/stress/crying spells Patient primarily presents today due to continued fatigue. This is accompanied by high stress due to caregiver burden along with  crying spells. PHQ9 rates at 9 (1,1,3,3,1,0,0,0,0). I suspect depression causing  fatigue is primary reason she presented today. She persistently goes back to her stressors/fatigue/crying concerns throughout visit even while discussing other physical complaints. Strongly encouraged seeking behavioral health help but she states she may just use pastors/friends. She states she had considered antidepressants in the past but was uninterested. She was given handout for [facility] and will return to see Dr. Elease Hashimoto in 1-2 weeks.  Shortness of  breath/palpitations/chest pain/hypertension but lower BP at home 3 months symptoms and doubt urgent or emergent threat to her heatlh. Will evaluate fatigue further with labs as below. Her shortness of breath sensation is mainly with bending over and is not exertional. Has had palpitations for years and hesitant to initiate cardiac workup without PCP input (and normal EKG today reassuring) as well as with 3-4 months of symptoms. No exertional chest pain or shortness of breath. Chest pain rare and thought due to GERD in past. Blood pressure in office actually high so hesitant to decrease meds. Instead follow up 1-2 weeks for repeat BP and continue home log.   1-2 week f/u. Emergent Return precautions advised.   Orders Placed This Encounter  Procedures  . EKG 12-Lead

## 2014-11-02 LAB — CBC WITH DIFFERENTIAL/PLATELET
BASOS PCT: 0.8 % (ref 0.0–3.0)
Basophils Absolute: 0.1 10*3/uL (ref 0.0–0.1)
EOS PCT: 1.7 % (ref 0.0–5.0)
Eosinophils Absolute: 0.1 10*3/uL (ref 0.0–0.7)
HCT: 40.9 % (ref 36.0–46.0)
HEMOGLOBIN: 14.1 g/dL (ref 12.0–15.0)
LYMPHS ABS: 2 10*3/uL (ref 0.7–4.0)
Lymphocytes Relative: 28.5 % (ref 12.0–46.0)
MCHC: 34.4 g/dL (ref 30.0–36.0)
MCV: 93.2 fl (ref 78.0–100.0)
MONO ABS: 0.5 10*3/uL (ref 0.1–1.0)
Monocytes Relative: 7.4 % (ref 3.0–12.0)
Neutro Abs: 4.4 10*3/uL (ref 1.4–7.7)
Neutrophils Relative %: 61.6 % (ref 43.0–77.0)
PLATELETS: 236 10*3/uL (ref 150.0–400.0)
RBC: 4.39 Mil/uL (ref 3.87–5.11)
RDW: 13 % (ref 11.5–15.5)
WBC: 7.1 10*3/uL (ref 4.0–10.5)

## 2014-11-02 LAB — COMPREHENSIVE METABOLIC PANEL
ALK PHOS: 55 U/L (ref 39–117)
ALT: 14 U/L (ref 0–35)
AST: 19 U/L (ref 0–37)
Albumin: 4.2 g/dL (ref 3.5–5.2)
BILIRUBIN TOTAL: 0.4 mg/dL (ref 0.2–1.2)
BUN: 14 mg/dL (ref 6–23)
CALCIUM: 9.8 mg/dL (ref 8.4–10.5)
CO2: 32 meq/L (ref 19–32)
CREATININE: 0.94 mg/dL (ref 0.40–1.20)
Chloride: 103 mEq/L (ref 96–112)
GFR: 61.61 mL/min (ref >60.00)
GLUCOSE: 88 mg/dL (ref 70–99)
Potassium: 4.3 mEq/L (ref 3.5–5.1)
SODIUM: 141 meq/L (ref 135–145)
TOTAL PROTEIN: 7 g/dL (ref 6.0–8.3)

## 2014-11-02 LAB — TSH: TSH: 0.62 u[IU]/mL (ref 0.35–4.50)

## 2014-11-24 DIAGNOSIS — L570 Actinic keratosis: Secondary | ICD-10-CM | POA: Diagnosis not present

## 2014-11-24 DIAGNOSIS — D1800 Hemangioma unspecified site: Secondary | ICD-10-CM | POA: Diagnosis not present

## 2014-12-26 ENCOUNTER — Telehealth: Payer: Self-pay | Admitting: Family Medicine

## 2014-12-26 NOTE — Telephone Encounter (Signed)
Patient Name: Wendy Sandoval  DOB: [birth year]    Initial Comment Caller states that her bp shot up while at the dentist. Did not have any numbing medicine or anything. Bp was 240/96 and is now144/82. Head hurts on the right side and hard to concentrate a bit   Nurse Assessment  Nurse: Thad Ranger, RN, Langley Gauss Date/Time (Eastern Time): 12/26/2014 5:11:03 PM  Confirm and document reason for call. If symptomatic, describe symptoms. ---Pt states her BP got up to 240/96 while at the dentist today and is now 152/91, HR 73. She is due to take her Lisinopril and Metoprolol. Advised to take her BP meds now a/o by MD.  Has the patient traveled out of the country within the last 30 days? ---Not Applicable  Does the patient require triage? ---Yes  Related visit to physician within the last 2 weeks? ---No  Does the PT have any chronic conditions? (i.e. diabetes, asthma, etc.) ---Yes  List chronic conditions. ---HTN     Guidelines    Guideline Title Affirmed Question Affirmed Notes  High Blood Pressure [1] BP ? 140/90 AND [2] taking BP medications States she has slight blurred vision that started at approx 12 noon today when her HA started. Cont to have a slight HA and blurred vision. Erring on side of caution and advising pt to be eval within 24 hrs. BP currently is 152/91, HR 73 and she just took her pm antihypertensive meds.   Final Disposition User   See Physician within 24 Hours [place], RN, Langley Gauss    Comments  Appt made at [facility] w/Dr Burchette at 1145 on 12/27/14. Pt aware and agreeable.   Referrals  REFERRED TO PCP OFFICE  REFERRED TO PCP OFFICE   Disagree/Comply: Comply

## 2014-12-27 ENCOUNTER — Ambulatory Visit (INDEPENDENT_AMBULATORY_CARE_PROVIDER_SITE_OTHER): Payer: Medicare Other | Admitting: Family Medicine

## 2014-12-27 ENCOUNTER — Encounter: Payer: Self-pay | Admitting: Family Medicine

## 2014-12-27 VITALS — HR 70 | Temp 98.3°F | Wt 157.0 lb

## 2014-12-27 DIAGNOSIS — I10 Essential (primary) hypertension: Secondary | ICD-10-CM | POA: Diagnosis not present

## 2014-12-27 DIAGNOSIS — R413 Other amnesia: Secondary | ICD-10-CM | POA: Diagnosis not present

## 2014-12-27 MED ORDER — AMLODIPINE BESYLATE 5 MG PO TABS: 5.0000 mg | ORAL_TABLET | Freq: Every day | ORAL | Status: DC

## 2014-12-27 NOTE — Progress Notes (Signed)
Pre visit review using our clinic review tool, if applicable. No additional management support is needed unless otherwise documented below in the visit note. 

## 2014-12-27 NOTE — Patient Instructions (Signed)
Continue with all of your usual medications. Go ahead and start the Amlodipine one daily-  And start tonight.   Watch sodium intake.

## 2014-12-27 NOTE — Progress Notes (Signed)
Subjective:    Patient ID: Wendy Sandoval, female    DOB: May 23, 1940, 74 y.o.   MRN: [id number]  HPI Patient seen with severe blood pressure elevation yesterday at dentist office. She went to have some minor work on 1 tooth and apparently after procedure had blood pressure reportedly right arm 220/97 and left 221/94. She has had some mild diffuse headache over the past couple days. No dizziness. No syncope. She did apparently have some very mild amnesia following her dental procedure. No concern for seizure activity. No slurred speech. No focal weakness. she denies receiving any anesthesia-local or general. No cognitive impairment whatsoever today.    No alcohol use. No recent NSAID use. Follows low-sodium diet. She has not had any cognitive impairment today. Only mild headache. No chest pains. No dyspnea.  Past Medical History  Diagnosis Date  . HYPERCHOLESTEROLEMIA 07/07/2007  . HYPERTENSION 07/07/2007  . GERD 08/09/2008  . DIVERTICULOSIS, COLON 03/17/2007  . Fatty liver   . TMJ syndrome   . Headache(784.0)   . Hiatal hernia   . DJD (degenerative joint disease)   . Esophageal stricture   . Depression   . IBS (irritable bowel syndrome)   . Schatzki's ring   . Chronic UTI (urinary tract infection)     recurrent   Past Surgical History  Procedure Laterality Date  . Appendectomy    . Abdominal hysterectomy  1998  . Breast surgery      milk gland  . Lung surgery  1983    reports that she has never smoked. She has never used smokeless tobacco. She reports that she does not drink alcohol or use illicit drugs. family history includes Alcohol abuse in an other family member; Arthritis in an other family member; Diabetes in an other family member; Heart disease in an other family member; Hyperlipidemia in an other family member; Hypertension in an other family member; Stroke in an other family member. Allergies  Allergen Reactions  . Codeine Sulfate     REACTION: nausea  . Histamine       Anxious feeling      Review of Systems  Constitutional: Negative for appetite change, fatigue and unexpected weight change.  Eyes: Negative for visual disturbance.  Respiratory: Negative for cough, chest tightness, shortness of breath and wheezing.   Cardiovascular: Negative for chest pain and palpitations.  Gastrointestinal: Negative for abdominal pain.  Genitourinary: Negative for dysuria.  Neurological: Positive for headaches. Negative for dizziness, seizures, syncope, weakness and light-headedness.       Objective:   Physical Exam  Constitutional: She is oriented to person, place, and time. She appears well-developed and well-nourished. No distress.  Neck: Neck supple. No JVD present. No thyromegaly present.  No carotid bruits.  Cardiovascular: Normal rate and regular rhythm.   Pulmonary/Chest: Effort normal and breath sounds normal. No respiratory distress. She has no wheezes. She has no rales.  Musculoskeletal:  No pitting edema  Lymphadenopathy:    She has no cervical adenopathy.  Neurological: She is alert and oriented to person, place, and time. No cranial nerve deficit. Coordination normal.  No focal strength deficits  Psychiatric: She has a normal mood and affect. Her behavior is normal. Judgment and thought content normal.          Assessment & Plan:  #1 severe hypertension. Add amlodipine 5 mg daily. Continue all her other usual medications. Reassess in 3 days.  Reviewed possible side effects. #2 question transient amnesia yesterday following dental visit.: Nonfocal exam.  We discussed further evaluation but this point she has no concerning symptoms or findings on exam. Follow-up promptly for any confusion or other focal abnormalities.  No suspicion of seizure and no focal weakness or speech changes.

## 2014-12-30 ENCOUNTER — Ambulatory Visit (INDEPENDENT_AMBULATORY_CARE_PROVIDER_SITE_OTHER): Payer: Medicare Other | Admitting: Family Medicine

## 2014-12-30 ENCOUNTER — Encounter: Payer: Self-pay | Admitting: Family Medicine

## 2014-12-30 VITALS — BP 150/80 | HR 75 | Temp 98.3°F | Wt 158.0 lb

## 2014-12-30 DIAGNOSIS — I10 Essential (primary) hypertension: Secondary | ICD-10-CM | POA: Diagnosis not present

## 2014-12-30 NOTE — Patient Instructions (Signed)
Go ahead and decrease the Simvastatin to 20 mg once daily

## 2014-12-30 NOTE — Progress Notes (Signed)
   Subjective:    Patient ID: Wendy Sandoval, female    DOB: Mar 27, 1941, 74 y.o.   MRN: [id number]  HPI Follow-up hypertension. Refer to previous note. She had extreme elevation of blood pressure while at dentist and when here in office 3 days ago had systolic blood pressure 657. We initiated amlodipine 5 mg daily. She is tolerating without side effects. She's not had any confusion, headaches, peripheral edema, chest pains, or any focal weakness. Not monitoring blood pressure at home.  Past Medical History  Diagnosis Date  . HYPERCHOLESTEROLEMIA 07/07/2007  . HYPERTENSION 07/07/2007  . GERD 08/09/2008  . DIVERTICULOSIS, COLON 03/17/2007  . Fatty liver   . TMJ syndrome   . Headache(784.0)   . Hiatal hernia   . DJD (degenerative joint disease)   . Esophageal stricture   . Depression   . IBS (irritable bowel syndrome)   . Schatzki's ring   . Chronic UTI (urinary tract infection)     recurrent   Past Surgical History  Procedure Laterality Date  . Appendectomy    . Abdominal hysterectomy  1998  . Breast surgery      milk gland  . Lung surgery  1983    reports that she has never smoked. She has never used smokeless tobacco. She reports that she does not drink alcohol or use illicit drugs. family history includes Alcohol abuse in an other family member; Arthritis in an other family member; Diabetes in an other family member; Heart disease in an other family member; Hyperlipidemia in an other family member; Hypertension in an other family member; Stroke in an other family member. Allergies  Allergen Reactions  . Codeine Sulfate     REACTION: nausea  . Histamine     Anxious feeling      Review of Systems  Constitutional: Negative for fatigue.  HENT: Positive for congestion.   Eyes: Negative for visual disturbance.  Respiratory: Negative for cough, chest tightness, shortness of breath and wheezing.   Cardiovascular: Negative for chest pain, palpitations and leg swelling.    Neurological: Negative for dizziness, seizures, syncope, weakness, light-headedness and headaches.       Objective:   Physical Exam  Constitutional: She appears well-developed and well-nourished.  Cardiovascular: Normal rate and regular rhythm.   Pulmonary/Chest: Effort normal and breath sounds normal. No respiratory distress. She has no wheezes. She has no rales.  Musculoskeletal: She exhibits no edema.          Assessment & Plan:  Hypertension. Improved. Repeat reading by me left arm seated 156/70. We explained that she may continue to see some additional improvement with recent initiation of amlodipine. Schedule office follow-up in 6 weeks and repeat fasting lipids then. Consider further titration of lisinopril then if blood pressure not further improved

## 2014-12-30 NOTE — Progress Notes (Signed)
Pre visit review using our clinic review tool, if applicable. No additional management support is needed unless otherwise documented below in the visit note. 

## 2015-01-12 DIAGNOSIS — N200 Calculus of kidney: Secondary | ICD-10-CM | POA: Diagnosis not present

## 2015-01-12 DIAGNOSIS — N3020 Other chronic cystitis without hematuria: Secondary | ICD-10-CM | POA: Diagnosis not present

## 2015-01-12 DIAGNOSIS — R109 Unspecified abdominal pain: Secondary | ICD-10-CM | POA: Diagnosis not present

## 2015-01-22 ENCOUNTER — Encounter (HOSPITAL_COMMUNITY): Payer: Self-pay | Admitting: Emergency Medicine

## 2015-01-22 ENCOUNTER — Emergency Department (HOSPITAL_COMMUNITY)
Admission: EM | Admit: 2015-01-22 | Discharge: 2015-01-22 | Disposition: A | Discharge status: Home / Self Care | Payer: Medicare Other | Attending: Emergency Medicine | Admitting: Emergency Medicine

## 2015-01-22 ENCOUNTER — Emergency Department (HOSPITAL_COMMUNITY): Payer: Medicare Other

## 2015-01-22 DIAGNOSIS — E780 Pure hypercholesterolemia: Secondary | ICD-10-CM | POA: Diagnosis not present

## 2015-01-22 DIAGNOSIS — I10 Essential (primary) hypertension: Secondary | ICD-10-CM | POA: Insufficient documentation

## 2015-01-22 DIAGNOSIS — Z79899 Other long term (current) drug therapy: Secondary | ICD-10-CM | POA: Insufficient documentation

## 2015-01-22 DIAGNOSIS — K219 Gastro-esophageal reflux disease without esophagitis: Secondary | ICD-10-CM | POA: Diagnosis not present

## 2015-01-22 DIAGNOSIS — Z87440 Personal history of urinary (tract) infections: Secondary | ICD-10-CM | POA: Insufficient documentation

## 2015-01-22 DIAGNOSIS — M1990 Unspecified osteoarthritis, unspecified site: Secondary | ICD-10-CM | POA: Diagnosis not present

## 2015-01-22 DIAGNOSIS — Q394 Esophageal web: Secondary | ICD-10-CM | POA: Diagnosis not present

## 2015-01-22 DIAGNOSIS — Z8659 Personal history of other mental and behavioral disorders: Secondary | ICD-10-CM | POA: Insufficient documentation

## 2015-01-22 DIAGNOSIS — M25475 Effusion, left foot: Secondary | ICD-10-CM

## 2015-01-22 DIAGNOSIS — Z792 Long term (current) use of antibiotics: Secondary | ICD-10-CM | POA: Diagnosis not present

## 2015-01-22 DIAGNOSIS — M7989 Other specified soft tissue disorders: Secondary | ICD-10-CM | POA: Diagnosis not present

## 2015-01-22 DIAGNOSIS — Z7982 Long term (current) use of aspirin: Secondary | ICD-10-CM | POA: Diagnosis not present

## 2015-01-22 NOTE — ED Notes (Signed)
Patient c/o swelling to left foot that started last Sunday. Bruising noted to 2nd-5th toes. Denies any known injury. Patient denies any hx of CHF or DVT. Patient reports burning sensation.

## 2015-01-22 NOTE — ED Provider Notes (Signed)
CSN: [id number]     Arrival date & time 01/22/15  1133 History   First MD Initiated Contact with Patient 01/22/15 1249     Chief Complaint  Patient presents with  . Leg Swelling     (Consider location/radiation/quality/duration/timing/severity/associated sxs/prior Treatment) HPI Comments: 74 year old female with hypertension, hyperlipidemia, GERD who presents with left foot swelling and bruising. Patient states that one week ago she began noticing some tightness of her left foot associated with some mild swelling on the top of her foot. She denies any trauma. She has been applying ice, elevating her foot, and applying an Ace wrap throughout the week but has continued to have swelling in that area. She recently noticed some bruising of her toes. She has some mild tenderness to palpation but denies any significant pain. She is able to ambulate on that foot. She denies any lower leg swelling or pain. No history of gout or blood clots. No recent travel or hx of cancer. She has been well recently with no fevers, chest pain, or shortness of breath.  The history is provided by the patient.    Past Medical History  Diagnosis Date  . HYPERCHOLESTEROLEMIA 07/07/2007  . HYPERTENSION 07/07/2007  . GERD 08/09/2008  . DIVERTICULOSIS, COLON 03/17/2007  . Fatty liver   . TMJ syndrome   . Headache(784.0)   . Hiatal hernia   . DJD (degenerative joint disease)   . Esophageal stricture   . Depression   . IBS (irritable bowel syndrome)   . Schatzki's ring   . Chronic UTI (urinary tract infection)     recurrent   Past Surgical History  Procedure Laterality Date  . Appendectomy    . Abdominal hysterectomy  1998  . Breast surgery      milk gland  . Lung surgery  1983   Family History  Problem Relation Age of Onset  . Arthritis    . Hyperlipidemia    . Hypertension    . Alcohol abuse    . Diabetes    . Stroke    . Heart disease    . Arthritis Mother   . Thyroid disease Father    Social History   Substance Use Topics  . Smoking status: Never Smoker   . Smokeless tobacco: Never Used  . Alcohol Use: No   OB History    Gravida Para Term Preterm AB TAB SAB Ectopic Multiple Living   2 2 2       2      Review of Systems 10 Systems reviewed and are negative for acute change except as noted in the HPI.    Allergies  Codeine sulfate and Histamine  Home Medications   Prior to Admission medications   Medication Sig Start Date End Date Taking? Authorizing Provider  amLODipine (NORVASC) 5 MG tablet Take 1 tablet (5 mg total) by mouth daily. 12/27/14   Eulas Post, MD  aspirin 325 MG tablet Take 325 mg by mouth daily.      Historical Provider, MD  cephALEXin (KEFLEX) 250 MG capsule Take 250 mg by mouth daily.    Historical Provider, MD  cholecalciferol (VITAMIN D) 1000 UNITS tablet Take 1,000 Units by mouth 2 (two) times daily.     Historical Provider, MD  fish oil-omega-3 fatty acids 1000 MG capsule Take 1,200 mg by mouth daily.     Historical Provider, MD  hydrochlorothiazide (HYDRODIURIL) 25 MG tablet TAKE 1 TABLET EVERY DAY 04/25/14   Eulas Post, MD  lisinopril (PRINIVIL,ZESTRIL)  10 MG tablet TAKE 1 TABLET BY MOUTH DAILY 09/06/14   Eulas Post, MD  metoprolol (LOPRESSOR) 50 MG tablet TAKE 1/2 TABLET BY MOUTH TWICE DAILY 04/25/14   Eulas Post, MD  NEXIUM 40 MG capsule TAKE 1 CAPSULE BY MOUTH DAILY BEFORE BREAKFAST 04/27/14   Eulas Post, MD  omeprazole (PRILOSEC) 40 MG capsule Take 1 capsule (40 mg total) by mouth daily. 05/24/14   Eulas Post, MD  simvastatin (ZOCOR) 20 MG tablet Take 20 mg by mouth daily.    Historical Provider, MD   BP 174/89 mmHg  Pulse 65  Temp(Src) 98.1 F (36.7 C) (Oral)  Resp 16  Ht 5\' 4"  (1.626 m)  Wt 155 lb (70.308 kg)  BMI 26.59 kg/m2  SpO2 99% Physical Exam  Constitutional: She is oriented to person, place, and time. She appears well-developed and well-nourished. No distress.  HENT:  Head: Normocephalic and  atraumatic.  Musculoskeletal:  Focal swelling of dorsum of L foot near base of 5th metatarsal w/ mild tenderness to palpation; normal ROM and strength at ankle and toes; Achilles tendon intact, no proximal fibular tenderness, no midfoot instability  Neurological: She is alert and oriented to person, place, and time.  Normal sensation b/l lower extremities  Skin: Skin is warm and dry.  Ecchymoses on dorsal surface of L toes 2-5  Psychiatric: She has a normal mood and affect. Judgment normal.  pleasant  Nursing note and vitals reviewed.   ED Course  Procedures (including critical care time) Labs Review  Imaging Review Dg Foot Complete Left  01/22/2015   CLINICAL DATA:  Swelling dorsum left foot near base of fourth and fifth metatarsal, no known injury  EXAM: LEFT FOOT - COMPLETE 3+ VIEW  COMPARISON:  None.  FINDINGS: No fracture or dislocation. No periosteal reaction. Moderate heel spur.  IMPRESSION: No acute osseous abnormalities   Electronically Signed   By: Skipper Cliche M.D.   On: 01/22/2015 14:07   I have personally reviewed and evaluated these images  as part of my medical decision-making.   MDM   Final diagnoses:  Swelling of foot joint, left   74 year old female who presents with 1 week of dorsal left foot swelling as well as some ecchymoses on her left toes with no known trauma and no significant pain. Patient ambulatory at presentation. No lower leg tenderness or swelling noted on exam. Obtained plain films of left foot which show no fracture. The patient has no lower leg tenderness or edema to suggest DVT and she has no risk factors for blood clot. Furthermore, her focal swelling and bruising of toes suggests minor trauma rather than clot. The patient has very minimal tenderness to palpation and her exam is not consistent with infection. No bilateral symmetric swelling to suggest systemic pathology such as CHF, liver/renal pathology. I have reviewed supportive care instructions  including elevation/ice and f/u w/ PCP this week if sx not improved. Term precautions including worsening symptoms or signs of infection reviewed. Patient voiced understanding. All questions answered. Patient discharged in satisfactory condition.    Sharlett Iles, MD 01/22/15 [phone]

## 2015-01-22 NOTE — Discharge Instructions (Signed)

## 2015-01-26 DIAGNOSIS — N3020 Other chronic cystitis without hematuria: Secondary | ICD-10-CM | POA: Diagnosis not present

## 2015-01-26 DIAGNOSIS — N200 Calculus of kidney: Secondary | ICD-10-CM | POA: Diagnosis not present

## 2015-01-26 DIAGNOSIS — R312 Other microscopic hematuria: Secondary | ICD-10-CM | POA: Diagnosis not present

## 2015-01-30 ENCOUNTER — Ambulatory Visit (INDEPENDENT_AMBULATORY_CARE_PROVIDER_SITE_OTHER): Payer: Medicare Other | Admitting: Family Medicine

## 2015-01-30 VITALS — BP 142/74 | HR 68 | Temp 98.0°F | Wt 156.1 lb

## 2015-01-30 DIAGNOSIS — S9032XD Contusion of left foot, subsequent encounter: Secondary | ICD-10-CM

## 2015-01-30 NOTE — Progress Notes (Signed)
Pre visit review using our clinic review tool, if applicable. No additional management support is needed unless otherwise documented below in the visit note. 

## 2015-01-30 NOTE — Progress Notes (Signed)
   Subjective:    Patient ID: Wendy Sandoval, female    DOB: 04-Nov-1940, 74 y.o.   MRN: [id number]  HPI Patient seen for evaluation left foot pain. She denies any injury. About 2 weeks ago she first noticed some swelling and bruising of the foot. She went to a local urgent care but their x-ray machine was down. She subsequently went to emergency department in [place] and x-rays revealed no fracture. It was felt she probably had contusion. No evidence to suggest cellulitis or DVT. She has had very minimal pain. She initially had some bruising around her toes which is resolving. She has some mild swelling. No leg pain or increased leg swelling.  Past Medical History  Diagnosis Date  . HYPERCHOLESTEROLEMIA 07/07/2007  . HYPERTENSION 07/07/2007  . GERD 08/09/2008  . DIVERTICULOSIS, COLON 03/17/2007  . Fatty liver   . TMJ syndrome   . Headache(784.0)   . Hiatal hernia   . DJD (degenerative joint disease)   . Esophageal stricture   . Depression   . IBS (irritable bowel syndrome)   . Schatzki's ring   . Chronic UTI (urinary tract infection)     recurrent   Past Surgical History  Procedure Laterality Date  . Appendectomy    . Abdominal hysterectomy  1998  . Breast surgery      milk gland  . Lung surgery  1983    reports that she has never smoked. She has never used smokeless tobacco. She reports that she does not drink alcohol or use illicit drugs. family history includes Alcohol abuse in an other family member; Arthritis in her mother and another family member; Diabetes in an other family member; Heart disease in an other family member; Hyperlipidemia in an other family member; Hypertension in an other family member; Stroke in an other family member; Thyroid disease in her father. Allergies  Allergen Reactions  . Codeine Sulfate     REACTION: nausea  . Histamine     Anxious feeling  ]   Review of Systems  Constitutional: Negative for fever and chills.  Respiratory: Negative for  shortness of breath.   Cardiovascular: Negative for chest pain.       Objective:   Physical Exam  Constitutional: She appears well-developed and well-nourished.  Cardiovascular: Normal rate and regular rhythm.   Pulmonary/Chest: Effort normal and breath sounds normal. No respiratory distress. She has no wheezes. She has no rales.  Musculoskeletal: She exhibits edema.  She has small area of fading ecchymosis about 2 x 2 centimeters left lateral proximal foot dorsally. Very minimal edema. No bony tenderness. Feet are warm to touch with excellent peripheral pulses. Full range of motion ankle. Achilles is intact and nontender. No calf tenderness          Assessment & Plan:  Probable contusion left foot though she does not recall any specific injury. She does not have any suggestion of DVT, cellulitis, or significant bony injury. Reassurance.

## 2015-02-10 ENCOUNTER — Ambulatory Visit (INDEPENDENT_AMBULATORY_CARE_PROVIDER_SITE_OTHER): Payer: Medicare Other | Admitting: Family Medicine

## 2015-02-10 VITALS — BP 140/70 | HR 61 | Temp 97.9°F | Wt 157.0 lb

## 2015-02-10 DIAGNOSIS — I10 Essential (primary) hypertension: Secondary | ICD-10-CM | POA: Diagnosis not present

## 2015-02-10 NOTE — Progress Notes (Signed)
Pre visit review using our clinic review tool, if applicable. No additional management support is needed unless otherwise documented below in the visit note. 

## 2015-02-10 NOTE — Progress Notes (Signed)
   Subjective:    Patient ID: Wendy Sandoval, female    DOB: [birth year], 74 y.o.   MRN: [id number]  HPI Patient seen for follow-up hypertension. We added amlodipine recently. She is not monitoring blood pressure at home. No headaches. No chest pains. No dyspnea. Her current regimen includes amlodipine, lisinopril, HCTZ, and metoprolol. She's not any recent orthostatic changes.  Past Medical History  Diagnosis Date  . HYPERCHOLESTEROLEMIA 07/07/2007  . HYPERTENSION 07/07/2007  . GERD 08/09/2008  . DIVERTICULOSIS, COLON 03/17/2007  . Fatty liver   . TMJ syndrome   . Headache(784.0)   . Hiatal hernia   . DJD (degenerative joint disease)   . Esophageal stricture   . Depression   . IBS (irritable bowel syndrome)   . Schatzki's ring   . Chronic UTI (urinary tract infection)     recurrent   Past Surgical History  Procedure Laterality Date  . Appendectomy    . Abdominal hysterectomy  1998  . Breast surgery      milk gland  . Lung surgery  1983    reports that she has never smoked. She has never used smokeless tobacco. She reports that she does not drink alcohol or use illicit drugs. family history includes Alcohol abuse in an other family member; Arthritis in her mother and another family member; Diabetes in an other family member; Heart disease in an other family member; Hyperlipidemia in an other family member; Hypertension in an other family member; Stroke in an other family member; Thyroid disease in her father. Allergies  Allergen Reactions  . Codeine Sulfate     REACTION: nausea  . Histamine     Anxious feeling      Review of Systems  Constitutional: Negative for fatigue.  Eyes: Negative for visual disturbance.  Respiratory: Negative for cough, chest tightness, shortness of breath and wheezing.   Cardiovascular: Negative for chest pain, palpitations and leg swelling.  Neurological: Negative for dizziness, seizures, syncope, weakness, light-headedness and headaches.      Objective:   Physical Exam  Constitutional: She appears well-developed and well-nourished. No distress.  Neck: Neck supple. No thyromegaly present.  Cardiovascular: Normal rate and regular rhythm.   Pulmonary/Chest: Effort normal and breath sounds normal. No respiratory distress. She has no wheezes. She has no rales.  Musculoskeletal: She exhibits no edema.          Assessment & Plan:  Hypertension. Stable. Repeat right arm seated at rest 142/70. Continue current regimen. Routine follow-up 6 months

## 2015-03-06 ENCOUNTER — Other Ambulatory Visit: Payer: Self-pay | Admitting: Family Medicine

## 2015-04-19 ENCOUNTER — Other Ambulatory Visit: Payer: Self-pay | Admitting: Family Medicine

## 2015-04-20 ENCOUNTER — Other Ambulatory Visit: Payer: Self-pay | Admitting: Family Medicine

## 2015-04-20 MED ORDER — HYDROCHLOROTHIAZIDE 25 MG PO TABS: 25.0000 mg | ORAL_TABLET | Freq: Every day | ORAL | Status: DC

## 2015-04-20 NOTE — Telephone Encounter (Signed)
Sent to the pharmacy by [facility].  Pt seen on 02/10/15 and asked to return 08/2015.

## 2015-06-21 ENCOUNTER — Other Ambulatory Visit: Payer: Self-pay

## 2015-06-21 DIAGNOSIS — Z1231 Encounter for screening mammogram for malignant neoplasm of breast: Secondary | ICD-10-CM

## 2015-06-28 DIAGNOSIS — L299 Pruritus, unspecified: Secondary | ICD-10-CM | POA: Diagnosis not present

## 2015-07-10 ENCOUNTER — Ambulatory Visit
Admission: RE | Admit: 2015-07-10 | Discharge: 2015-07-10 | Disposition: A | Discharge status: Home / Self Care | Payer: Medicare Other | Source: Ambulatory Visit

## 2015-07-10 DIAGNOSIS — Z1231 Encounter for screening mammogram for malignant neoplasm of breast: Secondary | ICD-10-CM | POA: Diagnosis not present

## 2015-08-20 ENCOUNTER — Other Ambulatory Visit: Payer: Self-pay | Admitting: Internal Medicine

## 2015-08-26 ENCOUNTER — Other Ambulatory Visit: Payer: Self-pay | Admitting: Family Medicine

## 2015-09-04 ENCOUNTER — Other Ambulatory Visit: Payer: Self-pay | Admitting: Family Medicine

## 2015-09-18 ENCOUNTER — Ambulatory Visit (INDEPENDENT_AMBULATORY_CARE_PROVIDER_SITE_OTHER): Payer: Medicare Other | Admitting: Family Medicine

## 2015-09-18 VITALS — BP 150/80 | HR 64 | Temp 97.9°F | Ht 64.0 in | Wt 160.2 lb

## 2015-09-18 DIAGNOSIS — R609 Edema, unspecified: Secondary | ICD-10-CM | POA: Diagnosis not present

## 2015-09-18 DIAGNOSIS — E7800 Pure hypercholesterolemia, unspecified: Secondary | ICD-10-CM

## 2015-09-18 DIAGNOSIS — R0600 Dyspnea, unspecified: Secondary | ICD-10-CM

## 2015-09-18 DIAGNOSIS — I10 Essential (primary) hypertension: Secondary | ICD-10-CM | POA: Diagnosis not present

## 2015-09-18 DIAGNOSIS — M25473 Effusion, unspecified ankle: Secondary | ICD-10-CM

## 2015-09-18 DIAGNOSIS — R300 Dysuria: Secondary | ICD-10-CM

## 2015-09-18 LAB — HEPATIC FUNCTION PANEL
ALT: 16 U/L (ref 0–35)
AST: 17 U/L (ref 0–37)
Albumin: 4.3 g/dL (ref 3.5–5.2)
Alkaline Phosphatase: 64 U/L (ref 39–117)
BILIRUBIN DIRECT: 0.1 mg/dL (ref 0.0–0.3)
BILIRUBIN TOTAL: 0.6 mg/dL (ref 0.2–1.2)
Total Protein: 6.6 g/dL (ref 6.0–8.3)

## 2015-09-18 LAB — BASIC METABOLIC PANEL
BUN: 14 mg/dL (ref 6–23)
CHLORIDE: 102 meq/L (ref 96–112)
CO2: 30 meq/L (ref 19–32)
CREATININE: 0.65 mg/dL (ref 0.40–1.20)
Calcium: 9.8 mg/dL (ref 8.4–10.5)
GFR: 94.09 mL/min (ref >60.00)
GLUCOSE: 83 mg/dL (ref 70–99)
POTASSIUM: 4.1 meq/L (ref 3.5–5.1)
Sodium: 139 mEq/L (ref 135–145)

## 2015-09-18 LAB — POCT URINALYSIS DIPSTICK
BILIRUBIN UA: NEGATIVE
Glucose, UA: NEGATIVE
Ketones, UA: NEGATIVE
NITRITE UA: NEGATIVE
PH UA: 7.5
Protein, UA: NEGATIVE
SPEC GRAV UA: 1.01
UROBILINOGEN UA: 0.2

## 2015-09-18 LAB — LIPID PANEL
CHOL/HDL RATIO: 4
Cholesterol: 134 mg/dL (ref 0–200)
HDL: 37.8 mg/dL — ABNORMAL LOW (ref >39.00)
LDL CALC: 72 mg/dL (ref 0–99)
NonHDL: 96.22
Triglycerides: 122 mg/dL (ref 0.0–149.0)
VLDL: 24.4 mg/dL (ref 0.0–40.0)

## 2015-09-18 LAB — BRAIN NATRIURETIC PEPTIDE: PRO B NATRI PEPTIDE: 37 pg/mL (ref 0.0–100.0)

## 2015-09-18 NOTE — Progress Notes (Signed)
Pre visit review using our clinic review tool, if applicable. No additional management support is needed unless otherwise documented below in the visit note. 

## 2015-09-18 NOTE — Progress Notes (Signed)
Subjective:    Patient ID: Wendy Sandoval, female    DOB: Jun 15, 1940, 75 y.o.   MRN: [id number]  HPI    Patient has a long history of recurrent UTI.  She's had a few days of some mild burning with urination and frequency.  Denies any fevers or chills.  She is followed by urology. She takes low-dose  Cephalexin daily for  prophylaxis.   Hypertension which is treated with amlodipine, HCTZ, lisinopril, and  metoprolol.  Does not monitor blood pressure much at home. No headaches. No  dizziness.   She has some chronic mild ankle and lower leg edema. Nonpitting.  No orthopnea.  Has noticed some recent dyspnea on exertion with climbing hills.  No exertional chest pains. No history of CAD.   Hyperlipidemia treated with simvastatin. No myalgias.  GERD which has been well controlled with Nexium.  Past Medical History  Diagnosis Date  . HYPERCHOLESTEROLEMIA 07/07/2007  . HYPERTENSION 07/07/2007  . GERD 08/09/2008  . DIVERTICULOSIS, COLON 03/17/2007  . Fatty liver   . TMJ syndrome   . Headache(784.0)   . Hiatal hernia   . DJD (degenerative joint disease)   . Esophageal stricture   . Depression   . IBS (irritable bowel syndrome)   . Schatzki's ring   . Chronic UTI (urinary tract infection)     recurrent   Past Surgical History  Procedure Laterality Date  . Appendectomy    . Abdominal hysterectomy  1998  . Breast surgery      milk gland  . Lung surgery  1983    reports that she has never smoked. She has never used smokeless tobacco. She reports that she does not drink alcohol or use illicit drugs. family history includes Arthritis in her mother; Thyroid disease in her father. Allergies  Allergen Reactions  . Codeine Sulfate     REACTION: nausea  . Histamine     Anxious feeling          Review of Systems  Constitutional: Negative for fever, chills, fatigue and unexpected weight change.  Eyes: Negative for visual disturbance.  Respiratory: Positive for shortness of  breath. Negative for cough, chest tightness and wheezing.   Cardiovascular: Positive for leg swelling. Negative for chest pain and palpitations.  Gastrointestinal: Negative for nausea.  Endocrine: Negative for polydipsia and polyuria.  Genitourinary: Positive for dysuria and frequency.  Neurological: Negative for dizziness, seizures, syncope, weakness, light-headedness and headaches.       Objective:   Physical Exam  Constitutional: She is oriented to person, place, and time. She appears well-developed and well-nourished.  Neck: Neck supple. No JVD present.  Cardiovascular: Normal rate and regular rhythm.   Pulmonary/Chest: Effort normal and breath sounds normal. No respiratory distress. She has no wheezes. She has no rales.  Musculoskeletal:  Trace edema ankles and feet bilaterally. No pitting edema  Neurological: She is alert and oriented to person, place, and time.  Psychiatric: She has a normal mood and affect. Her behavior is normal.          Assessment & Plan:   #1 bilateral leg and foot edema. Relatively mild. Suspected venous stasis related. Elevate legs frequently. We discussed compression garments and she is not interested. Check further labs with basic metabolic panel and BNP level   #2 dyslipidemia. Check lipid and hepatic panel   #3 dyspnea on exertion. No history of any pulmonary problems. This occurs mostly with things like stairs and hills. May be more deconditioning. Doubt CHF.  Check BNP as above.   #4 history of recurrent dysuria. Frequent UTIs in the past. Urine culture sent.  Eulas Post MD [facility]

## 2015-09-20 LAB — URINE CULTURE: Colony Count: >=100000

## 2015-09-20 MED ORDER — CIPROFLOXACIN HCL 500 MG PO TABS: 500.0000 mg | ORAL_TABLET | Freq: Two times a day (BID) | ORAL | Status: DC

## 2015-09-20 NOTE — Addendum Note (Signed)
Addended by: Elio Forget on: 09/20/2015 09:11 AM   Modules accepted: Orders, SmartSet

## 2015-11-23 ENCOUNTER — Other Ambulatory Visit: Payer: Self-pay | Admitting: General Practice

## 2015-11-23 MED ORDER — HYDROCHLOROTHIAZIDE 25 MG PO TABS: ORAL_TABLET | ORAL | Status: DC

## 2015-12-16 ENCOUNTER — Other Ambulatory Visit: Payer: Self-pay | Admitting: Family Medicine

## 2015-12-21 NOTE — Progress Notes (Addendum)
Subjective:   Wendy Sandoval is a 75 y.o. female who presents for Medicare Annual (Subsequent) preventive examination.   HRA assessment completed during this visit with [name]   The Patient was informed that the wellness visit is to identify future health risk and educate and initiate measures that can reduce risk for increased disease through the lifespan.    NO ROS; Medicare Wellness Visit Last VO was 09/18/2015 HTN: good today; Last August had one episode at dentist of very high BP following a dental procedure of which she had no med.  Educated regarding MI in women  Psychosocial:(family OA; Thyroid disease) Takes care of her mother;  74yo; helps with shower every week; takes 'spit' baths herself Has a walker and a cane;  dtr who will be 50 and son is 8; they would help her if needed  Dr. Yong Channel sent someone to "get help"  Will give senior resource;  Spouse died x 9 yo;  Father died memory issues and strokes; died August 22, 1998 and stayed with dtr since   Tobacco; never smoked ETOH: no   Medications reviewed for issues; compliance; otc meds  BMI: 27.6  Diet Low sodium due to BP Eats 3 meals; Special k with bb and banana and almond milk Oatmeal with walnuts and dried cranberries Lunch; fix 1/2 peanut butter sandwich and 1/2 tomato sandwich; jello has light whipped cream Can get a whole wheat wrap with mushrooms and green pepper Grilled chicken Love shrimp; fried at times;  Likes to bake; 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;   Was going to the gym early am Now legs hurt; veins in legs hurt; has stenosis; bulging disc Has been to a chiropractor  Mows the yard;  Thinking about going back to the [place] term plan reviewed  Plans to stay in home;  Big yard; basement; rarely goes to the basement;  Has a shower chair that she uses with mother; No memory issue with mother   Home: level; barriers; or  needs identified as bathroom railing or other review;  Fall hx; no falls; last 3 years she was getting flowers out of back of car; just fell on knee; left knee hurts worse;   Given education on "Fall Prevention in the Home" for more safety tips the patient can apply as appropriate.   Personal safety issues reviewed:  1.  for risk such as safe community  2.  smoke detector yes  3.  firearms safety if applicable  4. protection when in the sun; no; wears long sleeves and wore visor 5. driving safety for seniors or any recent accidents. No  6. Has had deer ticks;    Risk for Depression reviewed: Any emotional problems? Anxious, depressed, irritable, sad or blue? Sometimes depressing watching her going down;  Up and busy at 7am and stops at 11:30  Denies feeling depressed or hopeless; voices pleasure in daily life How many social activities have you been engaged in within the last 2 weeks? No   Cognitive; so far doing well  Manages checkbook, medications; no failures of task Ad8 score reviewed for issues;  Issues making decisions; no  Less interest in hobbies / activities" no  Repeats questions, stories; family complaining: NO  Trouble using ordinary gadgets; microwave; computer: no  Forgets the month or year: no  Mismanaging finances: no  Missing apt: no but does write them down  Daily problems  with thinking of memory NO Ad8 score is 0  MMSE not appropriate unless AD8 score is > 2   Advanced Directive addressed; yes will bring copy for chart    Counseling Health Maintenance Gaps: Tetanus; Tdap- check price  Dexa; declines for now Calcium and vit d Strength bearing exercise  PCV 13:   Colonoscopy; 03/2007 at 65; repeat 03/2017 if repeated  EKG: 10/2014 Mammogram: 07/2015  Hearing: 4000 hz both ears   Ophthalmology exam went last march 2016;  No issues q 2 years; no issues  Glaucoma screening Diabetic retinopathy if appropriate   Immunizations Due: (Vaccines  reviewed and educated regarding any overdue)    Established and updated Risk reviewed and appropriate referral made or health recommendations:  Current Care Team reviewed and updated Cardiac Risk Factors include: advanced age (>39men, >56 women)     Objective:     Vitals: BP 132/70   Pulse (!) 57   Ht 5\' 2"  (1.575 m)   Wt 156 lb (70.8 kg)   SpO2 96%   BMI 28.53 kg/m   Body mass index is 28.53 kg/m.   Tobacco History  Smoking Status  . Never Smoker  Smokeless Tobacco  . Never Used     Counseling given: Yes   Past Medical History:  Diagnosis Date  . Chronic UTI (urinary tract infection)    recurrent  . Depression   . DIVERTICULOSIS, COLON 03/17/2007  . DJD (degenerative joint disease)   . Esophageal stricture   . Fatty liver   . GERD 08/09/2008  . Headache(784.0)   . Hiatal hernia   . HYPERCHOLESTEROLEMIA 07/07/2007  . HYPERTENSION 07/07/2007  . IBS (irritable bowel syndrome)   . Schatzki's ring   . TMJ syndrome    Past Surgical History:  Procedure Laterality Date  . ABDOMINAL HYSTERECTOMY  1998  . APPENDECTOMY    . BREAST SURGERY     milk gland  . LUNG SURGERY  1983   Family History  Problem Relation Age of Onset  . Arthritis    . Hyperlipidemia    . Hypertension    . Alcohol abuse    . Diabetes    . Stroke    . Heart disease    . Arthritis Mother   . Thyroid disease Father    History  Sexual Activity  . Sexual activity: Not on file    Outpatient Encounter Prescriptions as of 12/22/2015  Medication Sig  . amLODipine (NORVASC) 5 MG tablet TAKE 1 TABLET (5 MG TOTAL) BY MOUTH DAILY.  Marland Kitchen aspirin 325 MG tablet Take 325 mg by mouth daily.    . cephALEXin (KEFLEX) 250 MG capsule Take by mouth daily.  . cholecalciferol (VITAMIN D) 1000 UNITS tablet Take 1,000 Units by mouth 2 (two) times daily.   . fish oil-omega-3 fatty acids 1000 MG capsule Take 1,200 mg by mouth daily.   . hydrochlorothiazide (HYDRODIURIL) 25 MG tablet TAKE 1 TABLET (25 MG TOTAL) BY  MOUTH DAILY.  Marland Kitchen lisinopril (PRINIVIL,ZESTRIL) 10 MG tablet TAKE 1 TABLET BY MOUTH DAILY  . metoprolol (LOPRESSOR) 50 MG tablet TAKE 1/2 TABLET BY MOUTH TWICE DAILY  . NEXIUM 40 MG capsule TAKE 1 CAPSULE BY MOUTH DAILY BEFORE BREAKFAST  . simvastatin (ZOCOR) 20 MG tablet Take 20 mg by mouth daily.  . ciprofloxacin (CIPRO) 500 MG tablet Take 1 tablet (500 mg total) by mouth 2 (two) times daily. (Patient not taking: Reported on 12/22/2015)  . omeprazole (PRILOSEC) 40 MG capsule Take 1 capsule (40  mg total) by mouth daily. (Patient not taking: Reported on 12/22/2015)   No facility-administered encounter medications on file as of 12/22/2015.     Activities of Daily Living In your present state of health, do you have any difficulty performing the following activities: 12/22/2015  Hearing? N  Vision? N  Difficulty concentrating or making decisions? N  Walking or climbing stairs? N  Dressing or bathing? N  Doing errands, shopping? N  Preparing Food and eating ? N  Using the Toilet? N  In the past six months, have you accidently leaked urine? N  Do you have problems with loss of bowel control? N  Managing your Medications? N  Managing your Finances? N  Housekeeping or managing your Housekeeping? N  Some recent data might be hidden    Patient Care Team: Eulas Post, MD as PCP - General    Assessment:      Exercise Activities and Dietary recommendations Current Exercise Habits: Home exercise routine (May try to go back to exercise )  Goals    . Exercise 150 minutes per week (moderate activity)          Think about exercise again; gentle yoga; piliates; silver sneaker;  Interval exercises       Fall Risk Fall Risk  12/22/2015 11/01/2014 10/14/2013 04/20/2013  Falls in the past year? No No Yes Yes  Number falls in past yr: - - 1 1  Injury with Fall? - - Yes No   Depression Screen PHQ 2/9 Scores 12/22/2015 11/01/2014 10/14/2013 04/20/2013  PHQ - 2 Score 0 0 0 0     Cognitive  Testing MMSE - Mini Mental State Exam 12/22/2015  Not completed: (No Data)   Ad8 score 0   Immunization History  Administered Date(s) Administered  . Influenza Split 03/13/2011, 02/21/2012  . Influenza Whole 02/20/2009, 02/22/2013  . Influenza, High Dose Seasonal PF 02/18/2014  . Pneumococcal Polysaccharide-23 07/04/2011  . Zoster 04/20/2013   Screening Tests Health Maintenance  Topic Date Due  . TETANUS/TDAP  04/27/1960  . PNA vac Low Risk Adult (2 of 2 - PCV13) 07/03/2012  . INFLUENZA VACCINE  12/05/2015  . DEXA SCAN  12/20/2016 (Originally 04/27/2004)  . ZOSTAVAX  Completed      Plan:   Education officer, museum about deer tics Address: [name] [place], [place] [address]  Phone: [phone]  Discussed vaccination;  Tetanus will check on cost with pharmacy; TDAP or tetanus with pertussis recommended  Will take prevnar when you see Dr. Elease Hashimoto  Flu shot; takes at [facility]; call us to let us know when and where   Discussed taking Prevnar but did prefer to wait until November when she will make another apt to see Dr. Elease Hashimoto; may bring mother as well   Will also call if she has further issues with palpations which sometimes occur in the am. Recommended she rest some from her caregiving roll   Educated regarding triglycerides and given a copy of labs at her request    During the course of the visit the patient was educated and counseled about the following appropriate screening and preventive services:   Vaccines to include Pneumoccal, Influenza, Hepatitis B, Td, Zostavax, HCV (will update vaccines soon; prevnar and TDaP) will take flu at drug store with her mother   Electrocardiogram/ c/o of some palpitations; will schedule with Dr Elease Hashimoto if these continue; would recommend trying to decrease stress and time off from 24/7 caregiving   Cardiovascular Disease/ bP good;  Weight within normal NIH recommendations  Colorectal cancer screening aged out  Bone density  screening- declines / postponed dexa   Diabetes screening/ neg  Glaucoma screening/ eye exam neg  Mammography/ neg  Nutrition counseling   Patient Instructions (the written plan) was given to the patient.   [id number], RN  01/18/2016  Above notes reviewed. Agree with assessment and plan as per Wynetta Fines.  Eulas Post MD [facility]

## 2015-12-22 ENCOUNTER — Ambulatory Visit (INDEPENDENT_AMBULATORY_CARE_PROVIDER_SITE_OTHER): Payer: Medicare Other

## 2015-12-22 VITALS — BP 132/70 | HR 57 | Ht 62.0 in | Wt 156.0 lb

## 2015-12-22 DIAGNOSIS — Z0000 Encounter for general adult medical examination without abnormal findings: Secondary | ICD-10-CM | POA: Diagnosis not present

## 2015-12-22 NOTE — Patient Instructions (Addendum)
Wendy Sandoval , Thank you for taking time to come for your Medicare Wellness Visit. I appreciate your ongoing commitment to your health goals. Please review the following plan we discussed and let me know if I can assist you in the future.   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 [place], [place] [address]  Phone: [phone]  Discussed vaccination;  Tetanus will check on cost with pharmacy; TDAP or tetanus with pertussis recommended  Will take prevnar when you see Dr. Yong Channel again  Flu shot; takes at [facility]; call us to let us know when and where   Discussed taking Prevnar but did prefer to wait until November when she will make another apt to see Dr. Elease Hashimoto;   Will also call if she has further issues with palpations which sometimes occur in the am. Recommended she rest some from her caregiving roll   Educated regarding triglycerides and given a copy of labs at her request       These are the goals we discussed: Goals    . Exercise 150 minutes per week (moderate activity)          Think about exercise again; gentle yoga; piliates; silver sneaker;  Interval exercises        This is a list of the screening recommended for you and due dates:  Health Maintenance  Topic Date Due  . Tetanus Vaccine  04/27/1958  . DEXA scan (bone density measurement)  04/27/2004  . Pneumonia vaccines (2 of 2 - PCV13) 07/03/2012  . Flu Shot  12/05/2015  . Shingles Vaccine  Completed       Fall Prevention in the Home  Falls can cause injuries. They can happen to people of all ages. There are many things you can do to make your home safe and to help prevent falls.  WHAT CAN I DO ON THE OUTSIDE OF MY HOME?  Regularly fix the edges of walkways and driveways and fix any cracks.  Remove anything that might make you trip as you walk through a door, such as a raised step or threshold.  Trim any bushes or trees on the path to your home.  Use bright outdoor  lighting.  Clear any walking paths of anything that might make someone trip, such as rocks or tools.  Regularly check to see if handrails are loose or broken. Make sure that both sides of any steps have handrails.  Any raised decks and porches should have guardrails on the edges.  Have any leaves, snow, or ice cleared regularly.  Use sand or salt on walking paths during winter.  Clean up any spills in your garage right away. This includes oil or grease spills. WHAT CAN I DO IN THE BATHROOM?   Use night lights.  Install grab bars by the toilet and in the tub and shower. Do not use towel bars as grab bars.  Use non-skid mats or decals in the tub or shower.  If you need to sit down in the shower, use a plastic, non-slip stool.  Keep the floor dry. Clean up any water that spills on the floor as soon as it happens.  Remove soap buildup in the tub or shower regularly.  Attach bath mats securely with double-sided non-slip rug tape.  Do not have throw rugs and other things on the floor that can make you trip. WHAT CAN I DO IN THE BEDROOM?  Use night lights.  Make sure that you have a light  by your bed that is easy to reach.  Do not use any sheets or blankets that are too big for your bed. They should not hang down onto the floor.  Have a firm chair that has side arms. You can use this for support while you get dressed.  Do not have throw rugs and other things on the floor that can make you trip. WHAT CAN I DO IN THE KITCHEN?  Clean up any spills right away.  Avoid walking on wet floors.  Keep items that you use a lot in easy-to-reach places.  If you need to reach something above you, use a strong step stool that has a grab bar.  Keep electrical cords out of the way.  Do not use floor polish or wax that makes floors slippery. If you must use wax, use non-skid floor wax.  Do not have throw rugs and other things on the floor that can make you trip. WHAT CAN I DO WITH MY  STAIRS?  Do not leave any items on the stairs.  Make sure that there are handrails on both sides of the stairs and use them. Fix handrails that are broken or loose. Make sure that handrails are as long as the stairways.  Check any carpeting to make sure that it is firmly attached to the stairs. Fix any carpet that is loose or worn.  Avoid having throw rugs at the top or bottom of the stairs. If you do have throw rugs, attach them to the floor with carpet tape.  Make sure that you have a light switch at the top of the stairs and the bottom of the stairs. If you do not have them, ask someone to add them for you. WHAT ELSE CAN I DO TO HELP PREVENT FALLS?  Wear shoes that:  Do not have high heels.  Have rubber bottoms.  Are comfortable and fit you well.  Are closed at the toe. Do not wear sandals.  If you use a stepladder:  Make sure that it is fully opened. Do not climb a closed stepladder.  Make sure that both sides of the stepladder are locked into place.  Ask someone to hold it for you, if possible.  Clearly mark and make sure that you can see:  Any grab bars or handrails.  First and last steps.  Where the edge of each step is.  Use tools that help you move around (mobility aids) if they are needed. These include:  Canes.  Walkers.  Scooters.  Crutches.  Turn on the lights when you go into a dark area. Replace any light bulbs as soon as they burn out.  Set up your furniture so you have a clear path. Avoid moving your furniture around.  If any of your floors are uneven, fix them.  If there are any pets around you, be aware of where they are.  Review your medicines with your doctor. Some medicines can make you feel dizzy. This can increase your chance of falling. Ask your doctor what other things that you can do to help prevent falls.   This information is not intended to replace advice given to you by your health care provider. Make sure you discuss any  questions you have with your health care provider.   Document Released: 02/16/2009 Document Revised: 09/06/2014 Document Reviewed: 05/27/2014 Elsevier Interactive Patient Education 2016 Baring Maintenance, Female Adopting a healthy lifestyle and getting preventive care can go a long way to promote health  and wellness. Talk with your health care provider about what schedule of regular examinations is right for you. This is a good chance for you to check in with your provider about disease prevention and staying healthy. In between checkups, there are plenty of things you can do on your own. Experts have done a lot of research about which lifestyle changes and preventive measures are most likely to keep you healthy. Ask your health care provider for more information. WEIGHT AND DIET  Eat a healthy diet  Be sure to include plenty of vegetables, fruits, low-fat dairy products, and lean protein.  Do not eat a lot of foods high in solid fats, added sugars, or salt.  Get regular exercise. This is one of the most important things you can do for your health.  Most adults should exercise for at least 150 minutes each week. The exercise should increase your heart rate and make you sweat (moderate-intensity exercise).  Most adults should also do strengthening exercises at least twice a week. This is in addition to the moderate-intensity exercise.  Maintain a healthy weight  Body mass index (BMI) is a measurement that can be used to identify possible weight problems. It estimates body fat based on height and weight. Your health care provider can help determine your BMI and help you achieve or maintain a healthy weight.  For females 7 years of age and older:   A BMI below 18.5 is considered underweight.  A BMI of 18.5 to 24.9 is normal.  A BMI of 25 to 29.9 is considered overweight.  A BMI of 30 and above is considered obese.  Watch levels of cholesterol and blood lipids  You  should start having your blood tested for lipids and cholesterol at 77 years of age, then have this test every 5 years.  You may need to have your cholesterol levels checked more often if:  Your lipid or cholesterol levels are high.  You are older than 77 years of age.  You are at high risk for heart disease.  CANCER SCREENING   Lung Cancer  Lung cancer screening is recommended for adults 84-8 years old who are at high risk for lung cancer because of a history of smoking.  A yearly low-dose CT scan of the lungs is recommended for people who:  Currently smoke.  Have quit within the past 15 years.  Have at least a 30-pack-year history of smoking. A pack year is smoking an average of one pack of cigarettes a day for 1 year.  Yearly screening should continue until it has been 15 years since you quit.  Yearly screening should stop if you develop a health problem that would prevent you from having lung cancer treatment.  Breast Cancer  Practice breast self-awareness. This means understanding how your breasts normally appear and feel.  It also means doing regular breast self-exams. Let your health care provider know about any changes, no matter how small.  If you are in your 20s or 30s, you should have a clinical breast exam (CBE) by a health care provider every 1-3 years as part of a regular health exam.  If you are 49 or older, have a CBE every year. Also consider having a breast X-ray (mammogram) every year.  If you have a family history of breast cancer, talk to your health care provider about genetic screening.  If you are at high risk for breast cancer, talk to your health care provider about having an MRI and a  mammogram every year.  Breast cancer gene (BRCA) assessment is recommended for women who have family members with BRCA-related cancers. BRCA-related cancers include:  Breast.  Ovarian.  Tubal.  Peritoneal cancers.  Results of the assessment will determine  the need for genetic counseling and BRCA1 and BRCA2 testing. Cervical Cancer Your health care provider may recommend that you be screened regularly for cancer of the pelvic organs (ovaries, uterus, and vagina). This screening involves a pelvic examination, including checking for microscopic changes to the surface of your cervix (Pap test). You may be encouraged to have this screening done every 3 years, beginning at age 46.  For women ages 52-65, health care providers may recommend pelvic exams and Pap testing every 3 years, or they may recommend the Pap and pelvic exam, combined with testing for human papilloma virus (HPV), every 5 years. Some types of HPV increase your risk of cervical cancer. Testing for HPV may also be done on women of any age with unclear Pap test results.  Other health care providers may not recommend any screening for nonpregnant women who are considered low risk for pelvic cancer and who do not have symptoms. Ask your health care provider if a screening pelvic exam is right for you.  If you have had past treatment for cervical cancer or a condition that could lead to cancer, you need Pap tests and screening for cancer for at least 20 years after your treatment. If Pap tests have been discontinued, your risk factors (such as having a new sexual partner) need to be reassessed to determine if screening should resume. Some women have medical problems that increase the chance of getting cervical cancer. In these cases, your health care provider may recommend more frequent screening and Pap tests. Colorectal Cancer  This type of cancer can be detected and often prevented.  Routine colorectal cancer screening usually begins at 77 years of age and continues through 77 years of age.  Your health care provider may recommend screening at an earlier age if you have risk factors for colon cancer.  Your health care provider may also recommend using home test kits to check for hidden blood  in the stool.  A small camera at the end of a tube can be used to examine your colon directly (sigmoidoscopy or colonoscopy). This is done to check for the earliest forms of colorectal cancer.  Routine screening usually begins at age 29.  Direct examination of the colon should be repeated every 5-10 years through 77 years of age. However, you may need to be screened more often if early forms of precancerous polyps or small growths are found. Skin Cancer  Check your skin from head to toe regularly.  Tell your health care provider about any new moles or changes in moles, especially if there is a change in a mole's shape or color.  Also tell your health care provider if you have a mole that is larger than the size of a pencil eraser.  Always use sunscreen. Apply sunscreen liberally and repeatedly throughout the day.  Protect yourself by wearing long sleeves, pants, a wide-brimmed hat, and sunglasses whenever you are outside. HEART DISEASE, DIABETES, AND HIGH BLOOD PRESSURE   High blood pressure causes heart disease and increases the risk of stroke. High blood pressure is more likely to develop in:  People who have blood pressure in the high end of the normal range (130-139/85-89 mm Hg).  People who are overweight or obese.  People who are African  American.  If you are 16-70 years of age, have your blood pressure checked every 3-5 years. If you are 51 years of age or older, have your blood pressure checked every year. You should have your blood pressure measured twice--once when you are at a hospital or clinic, and once when you are not at a hospital or clinic. Record the average of the two measurements. To check your blood pressure when you are not at a hospital or clinic, you can use:  An automated blood pressure machine at a pharmacy.  A home blood pressure monitor.  If you are between 80 years and 39 years old, ask your health care provider if you should take aspirin to prevent  strokes.  Have regular diabetes screenings. This involves taking a blood sample to check your fasting blood sugar level.  If you are at a normal weight and have a low risk for diabetes, have this test once every three years after 77 years of age.  If you are overweight and have a high risk for diabetes, consider being tested at a younger age or more often. PREVENTING INFECTION  Hepatitis B  If you have a higher risk for hepatitis B, you should be screened for this virus. You are considered at high risk for hepatitis B if:  You were born in a country where hepatitis B is common. Ask your health care provider which countries are considered high risk.  Your parents were born in a high-risk country, and you have not been immunized against hepatitis B (hepatitis B vaccine).  You have HIV or AIDS.  You use needles to inject street drugs.  You live with someone who has hepatitis B.  You have had sex with someone who has hepatitis B.  You get hemodialysis treatment.  You take certain medicines for conditions, including cancer, organ transplantation, and autoimmune conditions. Hepatitis C  Blood testing is recommended for:  Everyone born from [birth year] through 1967.  Anyone with known risk factors for hepatitis C. Sexually transmitted infections (STIs)  You should be screened for sexually transmitted infections (STIs) including gonorrhea and chlamydia if:  You are sexually active and are younger than 77 years of age.  You are older than 77 years of age and your health care provider tells you that you are at risk for this type of infection.  Your sexual activity has changed since you were last screened and you are at an increased risk for chlamydia or gonorrhea. Ask your health care provider if you are at risk.  If you do not have HIV, but are at risk, it may be recommended that you take a prescription medicine daily to prevent HIV infection. This is called pre-exposure prophylaxis  (PrEP). You are considered at risk if:  You are sexually active and do not regularly use condoms or know the HIV status of your partner(s).  You take drugs by injection.  You are sexually active with a partner who has HIV. Talk with your health care provider about whether you are at high risk of being infected with HIV. If you choose to begin PrEP, you should first be tested for HIV. You should then be tested every 3 months for as long as you are taking PrEP.  PREGNANCY   If you are premenopausal and you may become pregnant, ask your health care provider about preconception counseling.  If you may become pregnant, take 400 to 800 micrograms (mcg) of folic acid every day.  If you want to prevent  pregnancy, talk to your health care provider about birth control (contraception). OSTEOPOROSIS AND MENOPAUSE   Osteoporosis is a disease in which the bones lose minerals and strength with aging. This can result in serious bone fractures. Your risk for osteoporosis can be identified using a bone density scan.  If you are 28 years of age or older, or if you are at risk for osteoporosis and fractures, ask your health care provider if you should be screened.  Ask your health care provider whether you should take a calcium or vitamin D supplement to lower your risk for osteoporosis.  Menopause may have certain physical symptoms and risks.  Hormone replacement therapy may reduce some of these symptoms and risks. Talk to your health care provider about whether hormone replacement therapy is right for you.  HOME CARE INSTRUCTIONS   Schedule regular health, dental, and eye exams.  Stay current with your immunizations.   Do not use any tobacco products including cigarettes, chewing tobacco, or electronic cigarettes.  If you are pregnant, do not drink alcohol.  If you are breastfeeding, limit how much and how often you drink alcohol.  Limit alcohol intake to no more than 1 drink per day for  nonpregnant women. One drink equals 12 ounces of beer, 5 ounces of wine, or 1 ounces of hard liquor.  Do not use street drugs.  Do not share needles.  Ask your health care provider for help if you need support or information about quitting drugs.  Tell your health care provider if you often feel depressed.  Tell your health care provider if you have ever been abused or do not feel safe at home.   This information is not intended to replace advice given to you by your health care provider. Make sure you discuss any questions you have with your health care provider.   Document Released: 11/05/2010 Document Revised: 05/13/2014 Document Reviewed: 03/24/2013 Elsevier Interactive Patient Education [employer].

## 2016-01-22 ENCOUNTER — Other Ambulatory Visit: Payer: Self-pay

## 2016-01-22 MED ORDER — LISINOPRIL 10 MG PO TABS: 10.0000 mg | ORAL_TABLET | Freq: Every day | ORAL | 2 refills | Status: DC

## 2016-01-24 ENCOUNTER — Other Ambulatory Visit: Payer: Self-pay | Admitting: Emergency Medicine

## 2016-01-24 MED ORDER — AMLODIPINE BESYLATE 5 MG PO TABS: 5.0000 mg | ORAL_TABLET | Freq: Every day | ORAL | 3 refills | Status: DC

## 2016-01-24 MED ORDER — METOPROLOL TARTRATE 50 MG PO TABS: 25.0000 mg | ORAL_TABLET | Freq: Two times a day (BID) | ORAL | 3 refills | Status: DC

## 2016-02-01 DIAGNOSIS — R350 Frequency of micturition: Secondary | ICD-10-CM | POA: Diagnosis not present

## 2016-03-13 ENCOUNTER — Ambulatory Visit (INDEPENDENT_AMBULATORY_CARE_PROVIDER_SITE_OTHER): Payer: Medicare Other | Admitting: Family Medicine

## 2016-03-13 ENCOUNTER — Encounter: Payer: Self-pay | Admitting: Family Medicine

## 2016-03-13 VITALS — BP 140/80 | HR 60 | Temp 98.0°F | Ht 62.0 in | Wt 159.0 lb

## 2016-03-13 DIAGNOSIS — Z23 Encounter for immunization: Secondary | ICD-10-CM | POA: Diagnosis not present

## 2016-03-13 DIAGNOSIS — Z0000 Encounter for general adult medical examination without abnormal findings: Secondary | ICD-10-CM | POA: Diagnosis not present

## 2016-03-13 DIAGNOSIS — R5383 Other fatigue: Secondary | ICD-10-CM | POA: Diagnosis not present

## 2016-03-13 LAB — CBC WITH DIFFERENTIAL/PLATELET
BASOS ABS: 0 10*3/uL (ref 0.0–0.1)
Basophils Relative: 0.5 % (ref 0.0–3.0)
EOS ABS: 0.1 10*3/uL (ref 0.0–0.7)
Eosinophils Relative: 1.8 % (ref 0.0–5.0)
HEMATOCRIT: 40.8 % (ref 36.0–46.0)
HEMOGLOBIN: 14 g/dL (ref 12.0–15.0)
LYMPHS PCT: 30 % (ref 12.0–46.0)
Lymphs Abs: 1.8 10*3/uL (ref 0.7–4.0)
MCHC: 34.4 g/dL (ref 30.0–36.0)
MCV: 93.5 fl (ref 78.0–100.0)
Monocytes Absolute: 0.5 10*3/uL (ref 0.1–1.0)
Monocytes Relative: 8.9 % (ref 3.0–12.0)
Neutro Abs: 3.5 10*3/uL (ref 1.4–7.7)
Neutrophils Relative %: 58.8 % (ref 43.0–77.0)
Platelets: 222 10*3/uL (ref 150.0–400.0)
RBC: 4.36 Mil/uL (ref 3.87–5.11)
RDW: 12.9 % (ref 11.5–15.5)
WBC: 6 10*3/uL (ref 4.0–10.5)

## 2016-03-13 LAB — TSH: TSH: 0.73 u[IU]/mL (ref 0.35–4.50)

## 2016-03-13 NOTE — Progress Notes (Deleted)
Subjective:     Patient ID: Wendy Sandoval, female   DOB: [birth year], 75 y.o.   MRN: [id number]  HPI   Review of Systems  Constitutional: Positive for fatigue. Negative for activity change, appetite change, fever and unexpected weight change.  HENT: Negative for ear pain, hearing loss, sore throat and trouble swallowing.   Eyes: Negative for visual disturbance.  Respiratory: Negative for cough and shortness of breath.   Cardiovascular: Negative for chest pain and palpitations.  Gastrointestinal: Negative for abdominal pain, blood in stool, constipation, diarrhea, nausea and vomiting.  Genitourinary: Negative for dysuria and hematuria.  Musculoskeletal: Negative for arthralgias, back pain and myalgias.  Skin: Negative for rash.  Neurological: Negative for dizziness, syncope and headaches.  Hematological: Negative for adenopathy.  Psychiatric/Behavioral: Positive for dysphoric mood. Negative for confusion and suicidal ideas.       Objective:   Physical Exam  Constitutional: She is oriented to person, place, and time. She appears well-developed and well-nourished. No distress.  Neck: Neck supple. No thyromegaly present.  Cardiovascular: Normal rate and regular rhythm.   Pulmonary/Chest: Effort normal and breath sounds normal. No respiratory distress. She has no wheezes.  Musculoskeletal: She exhibits no edema.  Lymphadenopathy:    She has no cervical adenopathy.  Neurological: She is alert and oriented to person, place, and time. No cranial nerve deficit.  Psychiatric: She has a normal mood and affect. Her behavior is normal. Judgment and thought content normal.       Assessment:     ***    Plan:     ***

## 2016-03-13 NOTE — Progress Notes (Signed)
Pre visit review using our clinic review tool, if applicable. No additional management support is needed unless otherwise documented below in the visit note. 

## 2016-03-13 NOTE — Progress Notes (Signed)
Subjective:     Patient ID: Wendy Sandoval, female   DOB: [birth year], 75 y.o.   MRN: [id number]  HPI  Patient seen for physical exam. She has chronic problems including obesity, kidney stones, frequent UTIs, hypertension, hyperlipidemia, history depression, IBS. Her immunizations are up-to-date with exception of needs Prevnar 13. Colonoscopy up-to-date. She gets yearly mammograms. She's had previous hysterectomy.  Medications reviewed and compliant with all. She continues to deal some depression issues since her husband died 12 years ago. She also cares for her 75 year old mother is very burdened with that at times.  Past Medical History:  Diagnosis Date  . Chronic UTI (urinary tract infection)    recurrent  . Depression   . DIVERTICULOSIS, COLON 03/17/2007  . DJD (degenerative joint disease)   . Esophageal stricture   . Fatty liver   . GERD 08/09/2008  . Headache(784.0)   . Hiatal hernia   . HYPERCHOLESTEROLEMIA 07/07/2007  . HYPERTENSION 07/07/2007  . IBS (irritable bowel syndrome)   . Schatzki's ring   . TMJ syndrome    Past Surgical History:  Procedure Laterality Date  . ABDOMINAL HYSTERECTOMY  1998  . APPENDECTOMY    . BREAST SURGERY     milk gland  . LUNG SURGERY  1983    reports that she has never smoked. She has never used smokeless tobacco. She reports that she does not drink alcohol or use drugs. family history includes Arthritis in her mother; Thyroid disease in her father. Allergies  Allergen Reactions  . Codeine Sulfate     REACTION: nausea  . Histamine     Anxious feeling    Review of Systems  Constitutional: Negative for activity change, appetite change, fatigue, fever and unexpected weight change.  HENT: Negative for ear pain, hearing loss, sore throat and trouble swallowing.   Eyes: Negative for visual disturbance.  Respiratory: Negative for cough and shortness of breath.   Cardiovascular: Negative for chest pain and palpitations.  Gastrointestinal:  Negative for abdominal pain, blood in stool, constipation and diarrhea.  Endocrine: Negative for polydipsia and polyuria.  Genitourinary: Negative for dysuria and hematuria.  Musculoskeletal: Positive for arthralgias. Negative for back pain and myalgias.  Skin: Negative for rash.  Neurological: Negative for dizziness, syncope and headaches.  Hematological: Negative for adenopathy.  Psychiatric/Behavioral: Positive for dysphoric mood. Negative for confusion. The patient is nervous/anxious.        Objective:   Physical Exam  Constitutional: She is oriented to person, place, and time. She appears well-developed and well-nourished.  HENT:  Head: Normocephalic and atraumatic.  Eyes: EOM are normal. Pupils are equal, round, and reactive to light.  Neck: Normal range of motion. Neck supple. No thyromegaly present.  Cardiovascular: Normal rate, regular rhythm and normal heart sounds.   No murmur heard. Pulmonary/Chest: Breath sounds normal. No respiratory distress. She has no wheezes. She has no rales.  Abdominal: Soft. Bowel sounds are normal. She exhibits no distension and no mass. There is no tenderness. There is no rebound and no guarding.  Musculoskeletal: Normal range of motion. She exhibits no edema.  Lymphadenopathy:    She has no cervical adenopathy.  Neurological: She is alert and oriented to person, place, and time. She displays normal reflexes. No cranial nerve deficit.  Skin: No rash noted.  Psychiatric: She has a normal mood and affect. Her behavior is normal. Judgment and thought content normal.  PH Q-9 score of 13       Assessment:     Physical exam.  Patient has history of recurrent depression with current PHQ-9 score of 13. Needs Prevnar 13.  Complains of some chronic fatigue which has been more or less chronic complaint for her    Plan:     -Prevnar 13 given -We discussed possible treatments for depression including counseling and/or medication and she is not  interested in either at this time. -No indication for Pap smear as she's had previous hysterectomy. She will continue with yearly mammograms -Check CBC and TSH. We did not obtain lipids or other chemistries today since she had these done last spring and they were stable  Eulas Post MD  Primary Care at [facility]

## 2016-03-14 ENCOUNTER — Other Ambulatory Visit: Payer: Self-pay | Admitting: Family Medicine

## 2016-05-04 DIAGNOSIS — R05 Cough: Secondary | ICD-10-CM | POA: Diagnosis not present

## 2016-05-04 DIAGNOSIS — J029 Acute pharyngitis, unspecified: Secondary | ICD-10-CM | POA: Diagnosis not present

## 2016-05-04 DIAGNOSIS — J40 Bronchitis, not specified as acute or chronic: Secondary | ICD-10-CM | POA: Diagnosis not present

## 2016-06-03 DIAGNOSIS — H2513 Age-related nuclear cataract, bilateral: Secondary | ICD-10-CM | POA: Diagnosis not present

## 2016-07-22 ENCOUNTER — Other Ambulatory Visit: Payer: Self-pay | Admitting: Family Medicine

## 2016-07-22 DIAGNOSIS — Z1231 Encounter for screening mammogram for malignant neoplasm of breast: Secondary | ICD-10-CM

## 2016-08-13 ENCOUNTER — Ambulatory Visit
Admission: RE | Admit: 2016-08-13 | Discharge: 2016-08-13 | Disposition: A | Discharge status: Home / Self Care | Payer: Medicare Other | Source: Ambulatory Visit | Attending: Family Medicine | Admitting: Family Medicine

## 2016-08-13 DIAGNOSIS — Z1231 Encounter for screening mammogram for malignant neoplasm of breast: Secondary | ICD-10-CM | POA: Diagnosis not present

## 2016-08-30 ENCOUNTER — Telehealth: Payer: Self-pay | Admitting: Internal Medicine

## 2016-08-30 NOTE — Telephone Encounter (Signed)
Pt states she has been having issues with right upper quadrant abd pain, bloating, and reflux. Requesting to see Dr. Henrene Pastor. Pt scheduled to see Dr. Henrene Pastor 09/25/16@9 :15am. Pt aware of appt.

## 2016-09-03 ENCOUNTER — Ambulatory Visit (INDEPENDENT_AMBULATORY_CARE_PROVIDER_SITE_OTHER): Payer: Medicare Other | Admitting: Family Medicine

## 2016-09-03 ENCOUNTER — Encounter: Payer: Self-pay | Admitting: Family Medicine

## 2016-09-03 VITALS — BP 140/70 | HR 66 | Temp 98.0°F | Wt 155.2 lb

## 2016-09-03 DIAGNOSIS — R600 Localized edema: Secondary | ICD-10-CM

## 2016-09-03 DIAGNOSIS — E7800 Pure hypercholesterolemia, unspecified: Secondary | ICD-10-CM | POA: Diagnosis not present

## 2016-09-03 DIAGNOSIS — R1011 Right upper quadrant pain: Secondary | ICD-10-CM

## 2016-09-03 LAB — BASIC METABOLIC PANEL
BUN: 11 mg/dL (ref 6–23)
CALCIUM: 9.9 mg/dL (ref 8.4–10.5)
CO2: 33 meq/L — AB (ref 19–32)
CREATININE: 0.69 mg/dL (ref 0.40–1.20)
Chloride: 101 mEq/L (ref 96–112)
GFR: 87.6 mL/min (ref >60.00)
GLUCOSE: 92 mg/dL (ref 70–99)
Potassium: 4.1 mEq/L (ref 3.5–5.1)
Sodium: 140 mEq/L (ref 135–145)

## 2016-09-03 LAB — CBC WITH DIFFERENTIAL/PLATELET
BASOS ABS: 0 10*3/uL (ref 0.0–0.1)
Basophils Relative: 0.5 % (ref 0.0–3.0)
EOS ABS: 0.1 10*3/uL (ref 0.0–0.7)
Eosinophils Relative: 1.3 % (ref 0.0–5.0)
HCT: 42.4 % (ref 36.0–46.0)
Hemoglobin: 14.5 g/dL (ref 12.0–15.0)
Lymphocytes Relative: 23.4 % (ref 12.0–46.0)
Lymphs Abs: 1.6 10*3/uL (ref 0.7–4.0)
MCHC: 34.2 g/dL (ref 30.0–36.0)
MCV: 94.1 fl (ref 78.0–100.0)
MONO ABS: 0.4 10*3/uL (ref 0.1–1.0)
Monocytes Relative: 6.5 % (ref 3.0–12.0)
NEUTROS ABS: 4.7 10*3/uL (ref 1.4–7.7)
Neutrophils Relative %: 68.3 % (ref 43.0–77.0)
PLATELETS: 232 10*3/uL (ref 150.0–400.0)
RBC: 4.51 Mil/uL (ref 3.87–5.11)
RDW: 12.3 % (ref 11.5–15.5)
WBC: 6.9 10*3/uL (ref 4.0–10.5)

## 2016-09-03 LAB — HEPATIC FUNCTION PANEL
ALK PHOS: 61 U/L (ref 39–117)
ALT: 13 U/L (ref 0–35)
AST: 16 U/L (ref 0–37)
Albumin: 4.2 g/dL (ref 3.5–5.2)
BILIRUBIN DIRECT: 0.2 mg/dL (ref 0.0–0.3)
Total Bilirubin: 0.8 mg/dL (ref 0.2–1.2)
Total Protein: 6.6 g/dL (ref 6.0–8.3)

## 2016-09-03 LAB — LIPID PANEL
CHOLESTEROL: 148 mg/dL (ref 0–200)
HDL: 47 mg/dL (ref >39.00)
LDL Cholesterol: 74 mg/dL (ref 0–99)
NONHDL: 100.94
Total CHOL/HDL Ratio: 3
Triglycerides: 137 mg/dL (ref 0.0–149.0)
VLDL: 27.4 mg/dL (ref 0.0–40.0)

## 2016-09-03 NOTE — Progress Notes (Signed)
Subjective:     Patient ID: Wendy Sandoval, female   DOB: [birth year], 76 y.o.   MRN: [id number]  HPI  Patient seen for the following issues  Bilateral leg edema. She's had some edema in the past but she thinks worsening over the past several months. Edema is worse late in the day. No orthopnea. No history of heart failure. She does have known history of varicose veins. She is not tolerated compression stockings in the past. She is on amlodipine 5 mg daily for hypertension and also takes lisinopril and HCTZ. No dyspnea with exertion.  She's had somewhat of a chronic issue of some right upper quadrant and epigastric abdominal pain. She had abdominal ultrasound about 6 years ago which was unremarkable. She has pain that radiates frequently to the right shoulder and scapular region. Symptoms worse after eating. She sometimes has nausea but no vomiting. No stool changes. Weight is down 4 pounds. She's been trying to scale back calories. She still has occasional breakthrough GERD symptoms. Takes Nexium about 4 days per week. No recent fevers or chills. No urinary changes. Colonoscopy up-to-date.  Past Medical History:  Diagnosis Date  . Chronic UTI (urinary tract infection)    recurrent  . Depression   . DIVERTICULOSIS, COLON 03/17/2007  . DJD (degenerative joint disease)   . Esophageal stricture   . Fatty liver   . GERD 08/09/2008  . Headache(784.0)   . Hiatal hernia   . HYPERCHOLESTEROLEMIA 07/07/2007  . HYPERTENSION 07/07/2007  . IBS (irritable bowel syndrome)   . Schatzki's ring   . TMJ syndrome    Past Surgical History:  Procedure Laterality Date  . ABDOMINAL HYSTERECTOMY  1998  . APPENDECTOMY    . BREAST EXCISIONAL BIOPSY Left    Benign   . BREAST SURGERY     milk gland  . LUNG SURGERY  1983    reports that she has never smoked. She has never used smokeless tobacco. She reports that she does not drink alcohol or use drugs. family history includes Arthritis in her mother; Thyroid  disease in her father. Allergies  Allergen Reactions  . Codeine Sulfate     REACTION: nausea  . Histamine     Anxious feeling    Review of Systems  Constitutional: Negative for fatigue.  HENT: Negative for trouble swallowing.   Eyes: Negative for visual disturbance.  Respiratory: Negative for cough, chest tightness, shortness of breath and wheezing.   Cardiovascular: Positive for leg swelling. Negative for chest pain and palpitations.  Gastrointestinal: Positive for abdominal pain and nausea. Negative for blood in stool, constipation, diarrhea and vomiting.  Endocrine: Negative for polydipsia and polyuria.  Genitourinary: Negative for dysuria.  Skin: Negative for rash.  Neurological: Negative for dizziness, seizures, syncope, weakness, light-headedness and headaches.       Objective:   Physical Exam  Constitutional: She appears well-developed and well-nourished.  Eyes: Pupils are equal, round, and reactive to light.  Neck: Neck supple. No JVD present. No thyromegaly present.  Cardiovascular: Normal rate and regular rhythm.  Exam reveals no gallop.   Pulmonary/Chest: Effort normal and breath sounds normal. No respiratory distress. She has no wheezes. She has no rales.  Abdominal: Soft. Bowel sounds are normal. She exhibits no distension and no mass. There is no rebound and no guarding.  Minimal tenderness to palpation right upper quadrant. No guarding or rebound. No hepatomegaly noted. No masses palpated.  Musculoskeletal: She exhibits no edema.  She has varicose veins in both lower extremities  but no pitting edema.  Neurological: She is alert.       Assessment:     #1 hypertension stable  #2 bilateral leg edema probably related to venous stasis predominantly. No history of known heart failure  #3 somewhat chronic but progressive right upper quadrant abdominal pain. Not consistently postprandial.  #4 hyperlipidemia    Plan:     -Check further labs with lipid panel,  hepatic panel, basic metabolic panel, CBC -Set up abdominal ultrasound to further assess her progressive right upper quadrant abdominal pains -Continue Nexium and step up to daily if necessary to help control GERD symptoms -Elevate legs frequently -Discussed compression garments but she declines at this time  Eulas Post MD [facility]

## 2016-09-03 NOTE — Patient Instructions (Signed)

## 2016-09-03 NOTE — Progress Notes (Signed)
Pre visit review using our clinic review tool, if applicable. No additional management support is needed unless otherwise documented below in the visit note. 

## 2016-09-09 ENCOUNTER — Other Ambulatory Visit: Payer: Self-pay | Admitting: Family Medicine

## 2016-09-11 ENCOUNTER — Ambulatory Visit
Admission: RE | Admit: 2016-09-11 | Discharge: 2016-09-11 | Disposition: A | Discharge status: Home / Self Care | Payer: Medicare Other | Source: Ambulatory Visit | Attending: Family Medicine | Admitting: Family Medicine

## 2016-09-11 DIAGNOSIS — R1011 Right upper quadrant pain: Secondary | ICD-10-CM | POA: Diagnosis not present

## 2016-09-25 ENCOUNTER — Encounter: Payer: Self-pay | Admitting: Internal Medicine

## 2016-09-25 ENCOUNTER — Ambulatory Visit (INDEPENDENT_AMBULATORY_CARE_PROVIDER_SITE_OTHER): Payer: Medicare Other | Admitting: Internal Medicine

## 2016-09-25 VITALS — BP 138/78 | HR 62 | Ht 61.5 in | Wt 156.6 lb

## 2016-09-25 DIAGNOSIS — K222 Esophageal obstruction: Secondary | ICD-10-CM | POA: Diagnosis not present

## 2016-09-25 DIAGNOSIS — G8929 Other chronic pain: Secondary | ICD-10-CM | POA: Diagnosis not present

## 2016-09-25 DIAGNOSIS — R1011 Right upper quadrant pain: Secondary | ICD-10-CM

## 2016-09-25 DIAGNOSIS — R140 Abdominal distension (gaseous): Secondary | ICD-10-CM

## 2016-09-25 DIAGNOSIS — K219 Gastro-esophageal reflux disease without esophagitis: Secondary | ICD-10-CM

## 2016-09-25 MED ORDER — ESOMEPRAZOLE MAGNESIUM 40 MG PO CPDR: 40.0000 mg | DELAYED_RELEASE_CAPSULE | Freq: Every day | ORAL | 6 refills | Status: DC

## 2016-09-25 NOTE — Progress Notes (Signed)
HISTORY OF PRESENT ILLNESS:  Wendy Sandoval is a 76 y.o. female with medical history as listed below. Previous patient of Dr. Verl Blalock. Establish care with me August 2015. See that dictation. She presents today with chief complaints of chronic right upper quadrant pain, bloating, intermittent reflux symptoms, and a request of reassurance that there is nothing seriously wrong with her. At time for last visit she told me that her husband died of pancreatic cancer. This concerned her. Today, she tells me that her mother, who had been living with her, passed away this 2022-05-28 at age 38. She is lonely. In terms of reflux symptoms, she is currently taking Nexium over-the-counter 3 or 4 times per week. She did experience one significant episode of regurgitation with breakthrough symptoms. She also describes spasms of the throat and esophagus on one occasion which lasted less than 1 minute and was described as sharp. Not associated with meals or exertion. Previous upper endoscopy in 2012 with peptic stricture which was dilated. No true esophageal dysphagia. Chronic intermittent problems with bloating and intestinal gas. Aching or fullness sensation in the right upper quadrant every couple of weeks. Seemingly worse with activity. May last all day. Abdominal ultrasound ordered by her PCP for this complaint 09/11/2016 was unremarkable. Blood work from 09/03/2016 was unremarkable including CBC, comprehensive metabolic panel, and lipid testing. Previous colonoscopies without neoplasia. Difficult exams due to adhesions. Last examination 2008. The patient denies dietary change, smoking, change in weight, or new medications aside from vitamin D.  REVIEW OF SYSTEMS:  All non-GI ROS negative except for anxiety, arthritis, back pain, depression, fatigue, insomnia, ankle swelling, shortness of breath  Past Medical History:  Diagnosis Date  . Chronic UTI (urinary tract infection)    recurrent  . Depression   .  DIVERTICULOSIS, COLON 03/17/2007  . DJD (degenerative joint disease)   . Esophageal stricture   . Fatty liver   . GERD 08/09/2008  . Headache(784.0)   . Hiatal hernia   . HYPERCHOLESTEROLEMIA 07/07/2007  . HYPERTENSION 07/07/2007  . IBS (irritable bowel syndrome)   . Schatzki's ring   . TMJ syndrome     Past Surgical History:  Procedure Laterality Date  . ABDOMINAL HYSTERECTOMY  1998  . APPENDECTOMY    . BREAST EXCISIONAL BIOPSY Left    Benign   . BREAST SURGERY     milk gland  . [place]  reports that she has never smoked. She has never used smokeless tobacco. She reports that she does not drink alcohol or use drugs.  family history includes Arthritis in her mother; Thyroid disease in her father.  Allergies  Allergen Reactions  . Codeine Sulfate     REACTION: nausea  . Histamine     Anxious feeling       PHYSICAL EXAMINATION: Vital signs: BP 138/78   Pulse 62   Ht 5' 1.5" (1.562 m)   Wt 156 lb 9.6 oz (71 kg)   BMI 29.11 kg/m   Constitutional: Pleasant, without thyromegaly Lymph, generally well-appearing, no acute distress Psychiatric: alert and oriented x3, cooperative Eyes: extraocular movements intact, anicteric, conjunctiva pink Mouth: oral pharynx moist, no lesions Neck: supple no lymphadenopathy Cardiovascular: heart regular rate and rhythm, no murmur Lungs: clear to auscultation bilaterally Abdomen: soft, nontender, nondistended, no obvious ascites, no peritoneal signs, normal bowel sounds, no organomegaly Rectal: Omitted Extremities: no clubbing or cyanosis. Trace lower extremity edema bilaterally. Varicose veins Skin: no clinically relevant  lesions on visible extremities Neuro: No focal deficits. Cranial nerves intact  ASSESSMENT:  #1. Chronic right upper quadrant pain. Musculoskeletal #2. Chronic intermittent bloating without alarm features #3. GERD. Significant symptoms with irregular use of PPI #4.  History of diverticulosis #5. History of esophageal stricture status post dilation. No recurrent dysphagia #6. Gen. medical problems #7. Health related anxiety   PLAN:  #1. Reflux precautions #2. Advised to take Nexium daily #3. Educated regarding musculoskeletal pain and bloating. Reassurance provided #4. Recommended probiotic align. One daily for one month for bloating #5. Provided with literature on increased intestinal gas and bloating #6. Return to the care of PCP. GI follow-up as needed   25 minutes was spent face-to-face with the patient. Greater than 50% a time as use for counseling regarding her myriad of GI issues as listed above, clinical impressions, and recommendations

## 2016-09-25 NOTE — Patient Instructions (Signed)
We have sent the following medications to your pharmacy for you to pick up at your convenience:  Nexium  Please follow up as needed

## 2016-10-01 DIAGNOSIS — N3020 Other chronic cystitis without hematuria: Secondary | ICD-10-CM | POA: Diagnosis not present

## 2016-10-10 ENCOUNTER — Emergency Department (HOSPITAL_COMMUNITY)
Admission: EM | Admit: 2016-10-10 | Discharge: 2016-10-10 | Disposition: A | Discharge status: Home / Self Care | Payer: Medicare Other | Attending: Emergency Medicine | Admitting: Emergency Medicine

## 2016-10-10 ENCOUNTER — Encounter (HOSPITAL_COMMUNITY): Payer: Self-pay | Admitting: Emergency Medicine

## 2016-10-10 ENCOUNTER — Emergency Department (HOSPITAL_COMMUNITY): Payer: Medicare Other

## 2016-10-10 DIAGNOSIS — S0990XA Unspecified injury of head, initial encounter: Secondary | ICD-10-CM | POA: Insufficient documentation

## 2016-10-10 DIAGNOSIS — W228XXA Striking against or struck by other objects, initial encounter: Secondary | ICD-10-CM | POA: Insufficient documentation

## 2016-10-10 DIAGNOSIS — I10 Essential (primary) hypertension: Secondary | ICD-10-CM | POA: Insufficient documentation

## 2016-10-10 DIAGNOSIS — Z79899 Other long term (current) drug therapy: Secondary | ICD-10-CM | POA: Insufficient documentation

## 2016-10-10 DIAGNOSIS — R42 Dizziness and giddiness: Secondary | ICD-10-CM

## 2016-10-10 DIAGNOSIS — Y929 Unspecified place or not applicable: Secondary | ICD-10-CM | POA: Diagnosis not present

## 2016-10-10 DIAGNOSIS — Y999 Unspecified external cause status: Secondary | ICD-10-CM | POA: Diagnosis not present

## 2016-10-10 DIAGNOSIS — R51 Headache: Secondary | ICD-10-CM | POA: Diagnosis not present

## 2016-10-10 DIAGNOSIS — Y939 Activity, unspecified: Secondary | ICD-10-CM | POA: Insufficient documentation

## 2016-10-10 DIAGNOSIS — Z7982 Long term (current) use of aspirin: Secondary | ICD-10-CM | POA: Diagnosis not present

## 2016-10-10 NOTE — ED Provider Notes (Signed)
[facility] DEPT Provider Note   CSN: [id number] Arrival date & time: 10/10/16  2029     History   Chief Complaint Chief Complaint  Patient presents with  . Dizziness    HPI Wendy Sandoval is a 76 y.o. female.  The history is provided by the patient.  Head Injury   The incident occurred 6 to 12 hours ago. She came to the ER via walk-in. The injury mechanism was a direct blow. There was no loss of consciousness. There was no blood loss. Quality: none. The patient is experiencing no pain. The pain has been improving since the injury. Pertinent negatives include no numbness, no blurred vision, no vomiting, no tinnitus, no disorientation, no weakness and no memory loss. She has tried nothing for the symptoms.   History of vertigo, HTN. No blood thinners. Mowing grass earlier, ran into a tree limb. No LOC. Mild posterior headache, now resolved spontaneously. Dizziness and feeling woozy afterwards. No gait instability.  Past Medical History:  Diagnosis Date  . Chronic UTI (urinary tract infection)    recurrent  . Depression   . DIVERTICULOSIS, COLON 03/17/2007  . DJD (degenerative joint disease)   . Esophageal stricture   . Fatty liver   . GERD 08/09/2008  . Headache(784.0)   . Hiatal hernia   . HYPERCHOLESTEROLEMIA 07/07/2007  . HYPERTENSION 07/07/2007  . IBS (irritable bowel syndrome)   . Schatzki's ring   . TMJ syndrome     Patient Active Problem List   Diagnosis Date Noted  . Fatty liver 09/07/2010  . GERD (gastroesophageal reflux disease) 09/07/2010  . ESOPHAGEAL STRICTURE 05/15/2010  . RUQ PAIN 05/15/2010  . FATIGUE 02/13/2010  . PALPITATIONS 02/01/2010  . CHEST PAIN 01/31/2010  . DYSURIA 10/11/2009  . TMJ SYNDROME 10/20/2008  . GERD 08/09/2008  . HEADACHE 08/09/2008  . HYPERCHOLESTEROLEMIA 07/07/2007  . DEPRESSION, CHRONIC 07/07/2007  . Essential hypertension 07/07/2007  . DEGENERATIVE JOINT DISEASE 07/07/2007  . DIVERTICULOSIS, COLON 03/17/2007  .  IRRITABLE BOWEL SYNDROME 03/17/2007  . HIATAL HERNIA 11/26/1990    Past Surgical History:  Procedure Laterality Date  . ABDOMINAL HYSTERECTOMY  1998  . APPENDECTOMY    . BREAST EXCISIONAL BIOPSY Left    Benign   . BREAST SURGERY     milk gland  . LUNG SURGERY  1983    OB History    Gravida Para Term Preterm AB Living   2 2 2     2    SAB TAB Ectopic Multiple Live Births                   Home Medications    Prior to Admission medications   Medication Sig Start Date End Date Taking? Authorizing Provider  amLODipine (NORVASC) 5 MG tablet Take 1 tablet (5 mg total) by mouth daily. 01/24/16   Burchette, Alinda Sierras, MD  aspirin 325 MG tablet Take 325 mg by mouth daily.      [provider]  cephALEXin (KEFLEX) 250 MG capsule Take 250 mg by mouth daily. Given by urology 07/31/16   [provider]  cholecalciferol (VITAMIN D) 1000 UNITS tablet Take 1,000 Units by mouth 2 (two) times daily.     [provider]  esomeprazole (NEXIUM) 20 MG capsule Take 20 mg by mouth daily at 12 noon.    [provider]  esomeprazole (NEXIUM) 40 MG capsule Take 1 capsule (40 mg total) by mouth daily at 12 noon. 09/25/16   Irene Shipper, MD  fish oil-omega-3 fatty acids 1000 MG capsule Take 1,200 mg by mouth daily.     [provider]  hydrochlorothiazide (HYDRODIURIL) 25 MG tablet TAKE 1 TABLET (25 MG TOTAL) BY MOUTH DAILY. 09/09/16   Burchette, Alinda Sierras, MD  lisinopril (PRINIVIL,ZESTRIL) 10 MG tablet Take 1 tablet (10 mg total) by mouth daily. 01/22/16   Burchette, Alinda Sierras, MD  metoprolol (LOPRESSOR) 50 MG tablet Take 0.5 tablets (25 mg total) by mouth 2 (two) times daily. 01/24/16   Burchette, Alinda Sierras, MD  simvastatin (ZOCOR) 20 MG tablet Take 20 mg by mouth daily.    [provider]    Family History Family History  Problem Relation Age of Onset  . Arthritis Unknown   . Hyperlipidemia Unknown   . Hypertension Unknown   . Alcohol abuse Unknown   .  Diabetes Unknown   . Stroke Unknown   . Heart disease Unknown   . Arthritis Mother   . Thyroid disease Father   . Breast cancer Neg Hx   . Stomach cancer Neg Hx   . Colon cancer Neg Hx   . Esophageal cancer Neg Hx     Social History Social History  Substance Use Topics  . Smoking status: Never Smoker  . Smokeless tobacco: Never Used  . Alcohol use No     Allergies   Codeine sulfate and Histamine   Review of Systems Review of Systems  Constitutional: Negative for fever.  HENT: Negative for tinnitus.   Eyes: Negative for blurred vision.  Respiratory: Negative for shortness of breath.   Cardiovascular: Negative for chest pain.  Gastrointestinal: Negative for abdominal pain and vomiting.  Musculoskeletal: Negative for back pain and neck pain.  Skin: Negative for wound.  Allergic/Immunologic: Negative for immunocompromised state.  Neurological: Negative for weakness and numbness.  Hematological: Does not bruise/bleed easily.  Psychiatric/Behavioral: Negative for memory loss.  All other systems reviewed and are negative.    Physical Exam Updated Vital Signs BP (!) 186/60   Pulse 65   Temp 98.4 F (36.9 C)   Resp 20   Ht 5' 1.5" (1.562 m)   Wt 70.8 kg (156 lb)   SpO2 100%   BMI 29.00 kg/m   Physical Exam Physical Exam  Nursing note and vitals reviewed. Constitutional: Well developed, well nourished, non-toxic, and in no acute distress Head: Normocephalic and atraumatic.  Mouth/Throat: Oropharynx is clear and moist.  Neck: Normal range of motion. Neck supple. no cervical spine tenderness Cardiovascular: Normal rate and regular rhythm.   Pulmonary/Chest: Effort normal and breath sounds normal.  Abdominal: Soft. There is no tenderness. There is no rebound and no guarding.  Musculoskeletal: Normal range of motion.  Skin: Skin is warm and dry.  Psychiatric: Cooperative Neurological:  Alert, oriented to person, place, time, and situation. Memory grossly in  tact. Fluent speech. No dysarthria or aphasia.  Cranial nerves: VF are full.  EOMI without nystagmus. No gaze deviation. Facial muscles symmetric with activation. Sensation to light touch over face in tact bilaterally. Hearing grossly in tact. Palate elevates symmetrically. Head turn and shoulder shrug are intact. Tongue midline.  Reflexes defered.  Muscle bulk and tone normal. No pronator drift. Moves all extremities symmetrically. Sensation to light touch is in tact throughout in bilateral upper and lower extremities. Coordination reveals no dysmetria with finger to nose. Gait is narrow-based and steady. Non-ataxic.   ED Treatments / Results  Labs (all labs ordered are listed, but only abnormal results are displayed) Labs Reviewed -  No data to display  EKG  EKG Interpretation None       Radiology Ct Head Wo Contrast  Result Date: 10/10/2016 CLINICAL DATA:  Posterior headache after hitting head against a tree limb. Dizziness. History of vertigo and hypertension. EXAM: CT HEAD WITHOUT CONTRAST TECHNIQUE: Contiguous axial images were obtained from the base of the skull through the vertex without intravenous contrast. COMPARISON:  None. FINDINGS: Brain: Mild age related involutional changes of brain without acute intracranial hemorrhage, midline shift or edema. No intra-axial mass nor extra-axial fluid collections. Minimal small vessel ischemic disease of periventricular white matter. Vascular: No hyperdense vessels. Skull: No fracture or primary bone lesions. Sinuses/Orbits: No acute finding. Other: None IMPRESSION: Chronic appearing small vessel ischemic disease of periventricular white matter. No acute intracranial abnormality or skull fracture. Electronically Signed   By: Ashley Royalty M.D.   On: 10/10/2016 22:02    Procedures Procedures (including critical care time)  Medications Ordered in ED Medications - No data to display   Initial Impression / Assessment and Plan / ED Course    I have reviewed the triage vital signs and the nursing notes.  Pertinent labs & imaging results that were available during my care of the patient were reviewed by me and considered in my medical decision making (see chart for details).     Feeling foggy and a little dizzy after head injury. Well appearing. Normal neuro exam. Normal gait. States this is not same as previous vertigo symptoms. Ct head visualized and Shows no serious head injury. We'll discharge home with supportive care for likely mild concussion.  Final Clinical Impressions(s) / ED Diagnoses   Final diagnoses:  Dizziness  Injury of head, initial encounter    New Prescriptions New Prescriptions   No medications on file     Forde Dandy, MD 10/10/16 2212

## 2016-10-10 NOTE — ED Notes (Addendum)
Documented in error.

## 2016-10-10 NOTE — ED Triage Notes (Signed)
Pt c/o dizziness since tree limb hit her in head while mowing. Pt denies any loc.

## 2016-10-10 NOTE — ED Notes (Signed)
Pt states she hit her head on a limb while mowing. Denies any LOC. No knot noted to head. Neuro in tact. Pt ambulated to treatment room w/ steady gait.

## 2016-10-10 NOTE — Discharge Instructions (Signed)
Your CT head is normal. There is no serious head injury This may be mild concussion. Return for worsening symptoms, including confusion, difficulty walking, intractable vomiting, or any other symptoms concerning to you.

## 2016-10-10 NOTE — ED Notes (Signed)
Pt alert & oriented x4, stable gait. Patient  given discharge instructions, paperwork & prescription(s). Patient verbalized understanding. Pt left department w/ no further questions. 

## 2016-10-14 DIAGNOSIS — M48061 Spinal stenosis, lumbar region without neurogenic claudication: Secondary | ICD-10-CM | POA: Diagnosis not present

## 2016-10-14 DIAGNOSIS — M1711 Unilateral primary osteoarthritis, right knee: Secondary | ICD-10-CM | POA: Diagnosis not present

## 2016-10-14 DIAGNOSIS — M5416 Radiculopathy, lumbar region: Secondary | ICD-10-CM | POA: Diagnosis not present

## 2016-10-14 DIAGNOSIS — M4316 Spondylolisthesis, lumbar region: Secondary | ICD-10-CM | POA: Diagnosis not present

## 2016-10-27 ENCOUNTER — Other Ambulatory Visit: Payer: Self-pay | Admitting: Family Medicine

## 2016-11-30 ENCOUNTER — Other Ambulatory Visit: Payer: Self-pay | Admitting: Family Medicine

## 2016-12-04 ENCOUNTER — Ambulatory Visit (INDEPENDENT_AMBULATORY_CARE_PROVIDER_SITE_OTHER): Payer: Medicare Other | Admitting: Family Medicine

## 2016-12-04 ENCOUNTER — Encounter: Payer: Self-pay | Admitting: Family Medicine

## 2016-12-04 VITALS — BP 120/80 | HR 60 | Temp 99.0°F | Wt 156.7 lb

## 2016-12-04 DIAGNOSIS — I10 Essential (primary) hypertension: Secondary | ICD-10-CM | POA: Diagnosis not present

## 2016-12-04 DIAGNOSIS — F4323 Adjustment disorder with mixed anxiety and depressed mood: Secondary | ICD-10-CM

## 2016-12-04 DIAGNOSIS — R600 Localized edema: Secondary | ICD-10-CM

## 2016-12-04 MED ORDER — ESCITALOPRAM OXALATE 5 MG PO TABS: 5.0000 mg | ORAL_TABLET | Freq: Every day | ORAL | 6 refills | Status: DC

## 2016-12-04 NOTE — Progress Notes (Signed)
Subjective:     Patient ID: Wendy Sandoval, female   DOB: May 30, 1940, 76 y.o.   MRN: [id number]  HPI Patient seen for medical follow-up. She had some recent bilateral leg edema. Labs were unremarkable. She has some venous stasis. Does not wear compression garments consistently. Leg edema worse late in the day. She has some chronic mild dyspnea but no chest pains. She's had previous stress test which was unremarkable.  She has hypertension which is controlled with combination therapy.  Her mother died several months ago and she's had some difficulty with depression symptoms and also some daily chronic anxiety. She had previously requested diazepam and we expressed our concerns about taking this because of risk of cognitive impairment and also risk of falls.  She had some recent upper abdominal pain and has long-standing history of reflux. She was seen by GI. Ultrasound was unremarkable. Upped her Nexium to daily and symptoms are improved  Past Medical History:  Diagnosis Date  . Chronic UTI (urinary tract infection)    recurrent  . Depression   . DIVERTICULOSIS, COLON 03/17/2007  . DJD (degenerative joint disease)   . Esophageal stricture   . Fatty liver   . GERD 08/09/2008  . Headache(784.0)   . Hiatal hernia   . HYPERCHOLESTEROLEMIA 07/07/2007  . HYPERTENSION 07/07/2007  . IBS (irritable bowel syndrome)   . Schatzki's ring   . TMJ syndrome    Past Surgical History:  Procedure Laterality Date  . ABDOMINAL HYSTERECTOMY  1998  . APPENDECTOMY    . BREAST EXCISIONAL BIOPSY Left    Benign   . BREAST SURGERY     milk gland  . LUNG SURGERY  1983    reports that she has never smoked. She has never used smokeless tobacco. She reports that she does not drink alcohol or use drugs. family history includes Alcohol abuse in her unknown relative; Arthritis in her mother and unknown relative; Diabetes in her unknown relative; Heart disease in her unknown relative; Hyperlipidemia in her unknown  relative; Hypertension in her unknown relative; Stroke in her unknown relative; Thyroid disease in her father. Allergies  Allergen Reactions  . Codeine Sulfate     REACTION: nausea  . Histamine     Anxious feeling     Review of Systems  Constitutional: Negative for fatigue.  Eyes: Negative for visual disturbance.  Respiratory: Positive for shortness of breath. Negative for cough, chest tightness and wheezing.   Cardiovascular: Negative for chest pain, palpitations and leg swelling.  Gastrointestinal: Negative for abdominal pain.  Neurological: Negative for dizziness, seizures, syncope, weakness, light-headedness and headaches.  Psychiatric/Behavioral: Positive for dysphoric mood and sleep disturbance. The patient is nervous/anxious.        Objective:   Physical Exam  Constitutional: She is oriented to person, place, and time. She appears well-developed and well-nourished.  HENT:  Head: Normocephalic and atraumatic.  Eyes: Pupils are equal, round, and reactive to light. EOM are normal.  Neck: Normal range of motion. Neck supple. No thyromegaly present.  Cardiovascular: Normal rate, regular rhythm and normal heart sounds.   No murmur heard. Pulmonary/Chest: Breath sounds normal. No respiratory distress. She has no wheezes. She has no rales.  Abdominal: Soft. Bowel sounds are normal. She exhibits no distension and no mass. There is no tenderness. There is no rebound and no guarding.  Musculoskeletal: Normal range of motion.  Only trace nonpitting edema legs bilaterally. She has significant varicosities bilaterally. Good distal foot pulses  Lymphadenopathy:    She  has no cervical adenopathy.  Neurological: She is alert and oriented to person, place, and time. She displays normal reflexes. No cranial nerve deficit.  Skin: No rash noted.  Psychiatric: She has a normal mood and affect. Her behavior is normal. Judgment and thought content normal.       Assessment:     #1  hypertension stable and at goal  #2 chronic venous stasis lower extremities  #3 adjustment disorder with mixed anxiety and depression    Plan:     -Discussed trial of Lexapro 5 mg daily at bedtime and reassess in 3 weeks -Offered counseling -Set up Medicare annual wellness visit -Reassess in 3 weeks -Prescription for knee-high compression garments 20-30 mm  Eulas Post MD  Primary Care at [facility]

## 2016-12-25 ENCOUNTER — Ambulatory Visit (INDEPENDENT_AMBULATORY_CARE_PROVIDER_SITE_OTHER): Payer: Medicare Other | Admitting: Family Medicine

## 2016-12-25 DIAGNOSIS — F4323 Adjustment disorder with mixed anxiety and depressed mood: Secondary | ICD-10-CM

## 2016-12-25 MED ORDER — ESCITALOPRAM OXALATE 10 MG PO TABS
10.0000 mg | ORAL_TABLET | Freq: Every day | ORAL | 11 refills | Status: DC
Start: 2016-12-25 — End: 2017-03-26

## 2016-12-25 NOTE — Progress Notes (Signed)
Subjective:     Patient ID: Wendy Sandoval, female   DOB: 14-Dec-1940, 76 y.o.   MRN: [id number]  HPI Patient surface follow-up regarding anxiety symptoms. We started Lexapro 5 mg daily. She's still having episodes of anxiety which occur sporadically. She is still adjusting to the loss of her mother who died a few months ago. She is here with her daughter today. She is still getting out and doing things but motivation still slightly low. She had a few episodes where she woke up around 3 or 4 AM and couldn't get back to sleep and felt anxious overall. She's not had any side effects from Lexapro.  Past Medical History:  Diagnosis Date  . Chronic UTI (urinary tract infection)    recurrent  . Depression   . DIVERTICULOSIS, COLON 03/17/2007  . DJD (degenerative joint disease)   . Esophageal stricture   . Fatty liver   . GERD 08/09/2008  . Headache(784.0)   . Hiatal hernia   . HYPERCHOLESTEROLEMIA 07/07/2007  . HYPERTENSION 07/07/2007  . IBS (irritable bowel syndrome)   . Schatzki's ring   . TMJ syndrome    Past Surgical History:  Procedure Laterality Date  . ABDOMINAL HYSTERECTOMY  1998  . APPENDECTOMY    . BREAST EXCISIONAL BIOPSY Left    Benign   . BREAST SURGERY     milk gland  . LUNG SURGERY  1983    reports that she has never smoked. She has never used smokeless tobacco. She reports that she does not drink alcohol or use drugs. family history includes Alcohol abuse in her unknown relative; Arthritis in her mother and unknown relative; Diabetes in her unknown relative; Heart disease in her unknown relative; Hyperlipidemia in her unknown relative; Hypertension in her unknown relative; Stroke in her unknown relative; Thyroid disease in her father. Allergies  Allergen Reactions  . Codeine Sulfate     REACTION: nausea  . Histamine     Anxious feeling     Review of Systems  Respiratory: Negative for cough and shortness of breath.   Psychiatric/Behavioral: Negative for agitation  and confusion. The patient is nervous/anxious.        Objective:   Physical Exam  Constitutional: She appears well-developed and well-nourished.  Cardiovascular: Normal rate and regular rhythm.   Pulmonary/Chest: Effort normal and breath sounds normal. No respiratory distress. She has no wheezes. She has no rales.  Musculoskeletal: She exhibits no edema.       Assessment:     Adjustment disorder with mixed anxiety and depression    Plan:     -Titrate Lexapro to 10 mg daily -Reassess in one month -Set up Medicare subsequent annual wellness visit  Eulas Post MD [facility] at [facility]

## 2016-12-25 NOTE — Patient Instructions (Signed)
Go ahead and increase the Lexapro to 10 mg

## 2016-12-26 DIAGNOSIS — R350 Frequency of micturition: Secondary | ICD-10-CM | POA: Diagnosis not present

## 2016-12-26 DIAGNOSIS — N3020 Other chronic cystitis without hematuria: Secondary | ICD-10-CM | POA: Diagnosis not present

## 2017-01-02 DIAGNOSIS — L237 Allergic contact dermatitis due to plants, except food: Secondary | ICD-10-CM | POA: Diagnosis not present

## 2017-01-24 ENCOUNTER — Other Ambulatory Visit: Payer: Self-pay | Admitting: Family Medicine

## 2017-01-28 ENCOUNTER — Telehealth: Payer: Self-pay | Admitting: Family Medicine

## 2017-01-28 MED ORDER — LISINOPRIL 10 MG PO TABS: 10.0000 mg | ORAL_TABLET | Freq: Every day | ORAL | 1 refills | Status: DC

## 2017-01-28 NOTE — Telephone Encounter (Signed)
Rx resent.

## 2017-01-28 NOTE — Telephone Encounter (Signed)
Pt is calling to check the status of her Rx lisinopril and pt would like to see if this can be resent to the pharmacy.

## 2017-01-29 DIAGNOSIS — M4316 Spondylolisthesis, lumbar region: Secondary | ICD-10-CM | POA: Diagnosis not present

## 2017-01-29 DIAGNOSIS — M48061 Spinal stenosis, lumbar region without neurogenic claudication: Secondary | ICD-10-CM | POA: Diagnosis not present

## 2017-01-29 DIAGNOSIS — M1711 Unilateral primary osteoarthritis, right knee: Secondary | ICD-10-CM | POA: Diagnosis not present

## 2017-01-29 DIAGNOSIS — M5416 Radiculopathy, lumbar region: Secondary | ICD-10-CM | POA: Diagnosis not present

## 2017-02-10 ENCOUNTER — Encounter: Payer: Self-pay | Admitting: Family Medicine

## 2017-02-10 ENCOUNTER — Ambulatory Visit (INDEPENDENT_AMBULATORY_CARE_PROVIDER_SITE_OTHER): Payer: Medicare Other | Admitting: Family Medicine

## 2017-02-10 VITALS — BP 120/62 | HR 97 | Temp 98.2°F | Resp 16 | Wt 156.0 lb

## 2017-02-10 DIAGNOSIS — Z23 Encounter for immunization: Secondary | ICD-10-CM | POA: Diagnosis not present

## 2017-02-10 DIAGNOSIS — F4323 Adjustment disorder with mixed anxiety and depressed mood: Secondary | ICD-10-CM | POA: Diagnosis not present

## 2017-02-10 NOTE — Addendum Note (Signed)
Addended by: Gordy Councilman on: 02/10/2017 09:11 AM   Modules accepted: Orders

## 2017-02-10 NOTE — Progress Notes (Signed)
Subjective:     Patient ID: Wendy Sandoval, female   DOB: [birth year], 76 y.o.   MRN: [id number]  HPI Patient seen for follow-up regarding adjustment disorder with depressed mood and anxiety. Her mom just passed away and patient is only child her mother was in her late 51s and they live together for many years. We started Lexapro 5 mg daily. Last visit she still had some anxiety and depression issues and we recommended bumping up to 10 mg. She'll he took 10 mg dose for a few days. She states that she was doing so much better after giving the 5 mg more time but she is now back on 5 mg and doing very well. Less anxious. Less depression. She is staying engaged with friends, children, and church activities. Sleeping fairly well.  Past Medical History:  Diagnosis Date  . Chronic UTI (urinary tract infection)    recurrent  . Depression   . DIVERTICULOSIS, COLON 03/17/2007  . DJD (degenerative joint disease)   . Esophageal stricture   . Fatty liver   . GERD 08/09/2008  . Headache(784.0)   . Hiatal hernia   . HYPERCHOLESTEROLEMIA 07/07/2007  . HYPERTENSION 07/07/2007  . IBS (irritable bowel syndrome)   . Schatzki's ring   . TMJ syndrome    Past Surgical History:  Procedure Laterality Date  . ABDOMINAL HYSTERECTOMY  1998  . APPENDECTOMY    . BREAST EXCISIONAL BIOPSY Left    Benign   . BREAST SURGERY     milk gland  . LUNG SURGERY  1983    reports that she has never smoked. She has never used smokeless tobacco. She reports that she does not drink alcohol or use drugs. family history includes Alcohol abuse in her unknown relative; Arthritis in her mother and unknown relative; Diabetes in her unknown relative; Heart disease in her unknown relative; Hyperlipidemia in her unknown relative; Hypertension in her unknown relative; Stroke in her unknown relative; Thyroid disease in her father. Allergies  Allergen Reactions  . Codeine Sulfate     REACTION: nausea  . Histamine     Anxious feeling      Review of Systems  Psychiatric/Behavioral: Negative for agitation, confusion, decreased concentration, sleep disturbance and suicidal ideas.       Objective:   Physical Exam  Constitutional: She appears well-developed and well-nourished.  Cardiovascular: Normal rate and regular rhythm.   Pulmonary/Chest: Effort normal and breath sounds normal. No respiratory distress. She has no wheezes. She has no rales.  Musculoskeletal: She exhibits no edema.  Psychiatric: She has a normal mood and affect. Her behavior is normal. Judgment and thought content normal.       Assessment:     Adjustment disorder with mixed anxiety and depression improved on Lexapro    Plan:     -Continue Lexapro 5 mg daily -Flu vaccine given -Routine follow-up in 6 months and sooner as needed  Eulas Post MD [facility] at [facility]

## 2017-02-18 DIAGNOSIS — M4316 Spondylolisthesis, lumbar region: Secondary | ICD-10-CM | POA: Diagnosis not present

## 2017-02-18 DIAGNOSIS — M1711 Unilateral primary osteoarthritis, right knee: Secondary | ICD-10-CM | POA: Diagnosis not present

## 2017-02-18 DIAGNOSIS — M5416 Radiculopathy, lumbar region: Secondary | ICD-10-CM | POA: Diagnosis not present

## 2017-02-18 DIAGNOSIS — M25561 Pain in right knee: Secondary | ICD-10-CM | POA: Diagnosis not present

## 2017-02-18 DIAGNOSIS — M48061 Spinal stenosis, lumbar region without neurogenic claudication: Secondary | ICD-10-CM | POA: Diagnosis not present

## 2017-02-20 ENCOUNTER — Other Ambulatory Visit: Payer: Self-pay | Admitting: Orthopedic Surgery

## 2017-02-20 DIAGNOSIS — M25561 Pain in right knee: Secondary | ICD-10-CM

## 2017-03-08 ENCOUNTER — Other Ambulatory Visit: Payer: Self-pay | Admitting: Family Medicine

## 2017-03-26 ENCOUNTER — Other Ambulatory Visit: Payer: Self-pay | Admitting: *Deleted

## 2017-03-26 DIAGNOSIS — J40 Bronchitis, not specified as acute or chronic: Secondary | ICD-10-CM | POA: Diagnosis not present

## 2017-03-26 DIAGNOSIS — J209 Acute bronchitis, unspecified: Secondary | ICD-10-CM | POA: Diagnosis not present

## 2017-03-26 DIAGNOSIS — R05 Cough: Secondary | ICD-10-CM | POA: Diagnosis not present

## 2017-03-26 DIAGNOSIS — R062 Wheezing: Secondary | ICD-10-CM | POA: Diagnosis not present

## 2017-03-26 MED ORDER — ESCITALOPRAM OXALATE 10 MG PO TABS: 5.0000 mg | ORAL_TABLET | Freq: Every day | ORAL | 1 refills | Status: DC

## 2017-04-17 ENCOUNTER — Other Ambulatory Visit: Payer: Self-pay | Admitting: *Deleted

## 2017-04-17 MED ORDER — METOPROLOL TARTRATE 50 MG PO TABS: 25.0000 mg | ORAL_TABLET | Freq: Two times a day (BID) | ORAL | 1 refills | Status: DC

## 2017-04-18 ENCOUNTER — Other Ambulatory Visit: Payer: Self-pay | Admitting: Family Medicine

## 2017-04-21 ENCOUNTER — Other Ambulatory Visit: Payer: Self-pay | Admitting: Family Medicine

## 2017-08-04 DIAGNOSIS — H52223 Regular astigmatism, bilateral: Secondary | ICD-10-CM | POA: Diagnosis not present

## 2017-08-04 DIAGNOSIS — H5212 Myopia, left eye: Secondary | ICD-10-CM | POA: Diagnosis not present

## 2017-08-04 DIAGNOSIS — H16229 Keratoconjunctivitis sicca, not specified as Sjogren's, unspecified eye: Secondary | ICD-10-CM | POA: Diagnosis not present

## 2017-08-04 DIAGNOSIS — H2513 Age-related nuclear cataract, bilateral: Secondary | ICD-10-CM | POA: Diagnosis not present

## 2017-08-15 ENCOUNTER — Other Ambulatory Visit: Payer: Self-pay | Admitting: Family Medicine

## 2017-08-15 DIAGNOSIS — Z1231 Encounter for screening mammogram for malignant neoplasm of breast: Secondary | ICD-10-CM

## 2017-08-25 DIAGNOSIS — N3020 Other chronic cystitis without hematuria: Secondary | ICD-10-CM | POA: Diagnosis not present

## 2017-08-25 DIAGNOSIS — R300 Dysuria: Secondary | ICD-10-CM | POA: Diagnosis not present

## 2017-08-26 NOTE — Progress Notes (Addendum)
Subjective:   Wendy Sandoval is a 77 y.o. female who presents for Medicare Annual (Subsequent) preventive examination.  Reports health as sometimes good, sometimes not (water in basement) went up and down the steps A lot of swelling in left knee and now leg Took care of her mother for many years, died last year She had lived with Wendy Sandoval Have 2 children  Keeps dog for her son  Diet Lipids chol/hdl 3 Breakfast 1/2 sausage biscuit this am and 1/2 cup of coffee Drinking water Lunch    BMI 26  Exercise Curtailed now due to right leg swelling  C/o of mid back pain  Stays busy     There are no preventive care reminders to display for this patient. Colonoscopy aged out Mammogram 08/2016 scheduled for the 9th of May  Bone density - declining   Educated regarding shingrix   Cardiac Risk Factors include: advanced age (>74men, >40 women);dyslipidemia;family history of premature cardiovascular disease;hypertension     Objective:     Vitals: BP 122/70   Pulse (!) 50   Ht 5\' 2"  (1.575 m)   Wt 162 lb (73.5 kg)   SpO2 98%   BMI 29.63 kg/m   Body mass index is 29.63 kg/m.  Advanced Directives 08/27/2017 10/10/2016 12/22/2015 01/22/2015  Does Patient Have a Medical Advance Directive? Yes No Yes Yes  Type of Advance Directive - - - Therapist, sports in Chart? - - No - copy requested -    Tobacco Social History   Tobacco Use  Smoking Status Never Smoker  Smokeless Tobacco Never Used     Counseling given: Yes   Clinical Intake:     Past Medical History:  Diagnosis Date  . Chronic UTI (urinary tract infection)    recurrent  . Depression   . DIVERTICULOSIS, COLON 03/17/2007  . DJD (degenerative joint disease)   . Esophageal stricture   . Fatty liver   . GERD 08/09/2008  . Headache(784.0)   . Hiatal hernia   . HYPERCHOLESTEROLEMIA 07/07/2007  . HYPERTENSION 07/07/2007  . IBS (irritable bowel syndrome)   .  Schatzki's ring   . TMJ syndrome    Past Surgical History:  Procedure Laterality Date  . ABDOMINAL HYSTERECTOMY  1998  . APPENDECTOMY    . BREAST EXCISIONAL BIOPSY Left    Benign   . BREAST SURGERY     milk gland  . LUNG SURGERY  1983   Family History  Problem Relation Age of Onset  . Arthritis Unknown   . Hyperlipidemia Unknown   . Hypertension Unknown   . Alcohol abuse Unknown   . Diabetes Unknown   . Stroke Unknown   . Heart disease Unknown   . Arthritis Mother   . Thyroid disease Father   . Breast cancer Neg Hx   . Stomach cancer Neg Hx   . Colon cancer Neg Hx   . Esophageal cancer Neg Hx    Social History   Socioeconomic History  . Marital status: Widowed    Spouse name: Not on file  . Number of children: Not on file  . Years of education: Not on file  . Highest education level: Not on file  Occupational History  . Not on file  Social Needs  . Financial resource strain: Not on file  . Food insecurity:    Worry: Not on file    Inability: Not on file  . Transportation needs:  Medical: Not on file    Non-medical: Not on file  Tobacco Use  . Smoking status: Never Smoker  . Smokeless tobacco: Never Used  Substance and Sexual Activity  . Alcohol use: No  . Drug use: No  . Sexual activity: Not on file  Lifestyle  . Physical activity:    Days per week: Not on file    Minutes per session: Not on file  . Stress: Not on file  Relationships  . Social connections:    Talks on phone: Not on file    Gets together: Not on file    Attends religious service: Not on file    Active member of club or organization: Not on file    Attends meetings of clubs or organizations: Not on file    Relationship status: Not on file  Other Topics Concern  . Not on file  Social History Narrative  . Not on file    Outpatient Encounter Medications as of 08/27/2017  Medication Sig  . amLODipine (NORVASC) 5 MG tablet TAKE 1 TABLET (5 MG TOTAL) BY MOUTH DAILY.  Marland Kitchen aspirin 325  MG tablet Take 325 mg by mouth daily.    . cephALEXin (KEFLEX) 250 MG capsule Take 250 mg by mouth daily. Given by urology  . cholecalciferol (VITAMIN D) 1000 UNITS tablet Take 1,000 Units by mouth 2 (two) times daily.   Marland Kitchen escitalopram (LEXAPRO) 10 MG tablet Take 0.5 tablets (5 mg total) by mouth daily.  Marland Kitchen esomeprazole (NEXIUM) 20 MG capsule Take 20 mg by mouth daily at 12 noon.  . hydrochlorothiazide (HYDRODIURIL) 25 MG tablet TAKE 1 TABLET (25 MG TOTAL) BY MOUTH DAILY.  Marland Kitchen lisinopril (PRINIVIL,ZESTRIL) 10 MG tablet Take 1 tablet (10 mg total) by mouth daily.  . metoprolol tartrate (LOPRESSOR) 50 MG tablet TAKE 1/2 TABLETS (25 MG TOTAL) BY MOUTH 2 (TWO) TIMES DAILY.  . simvastatin (ZOCOR) 40 MG tablet TAKE 1 TABLET BY MOUTH DAILY AT BEDTIME (Patient taking differently: TAKE half TABLET BY MOUTH DAILY AT BEDTIME)  . fish oil-omega-3 fatty acids 1000 MG capsule Take 1,200 mg by mouth daily.    No facility-administered encounter medications on file as of 08/27/2017.     Activities of Daily Living In your present state of health, do you have any difficulty performing the following activities: 08/27/2017  Hearing? N  Vision? N  Difficulty concentrating or making decisions? N  Walking or climbing stairs? Y  Dressing or bathing? N  Doing errands, shopping? N  Preparing Food and eating ? N  Using the Toilet? N  In the past six months, have you accidently leaked urine? (No Data)  Comment has UTI and is seeing UR  Do you have problems with loss of bowel control? N  Managing your Medications? N  Managing your Finances? N  Housekeeping or managing your Housekeeping? N  Some recent data might be hidden    Patient Care Team: Wendy Post, MD as PCP - General  Wendy Sandoval is her UR due to frequent UTI - seen PA yesterday and she is being treated;     Assessment:   This is a routine wellness examination for Wendy Sandoval.  Exercise Activities and Dietary recommendations Current Exercise  Habits: Home exercise routine, Type of exercise: walking, Time (Minutes): 30(up and about walking the dog ), Frequency (Times/Week): 5, Weekly Exercise (Minutes/Week): 150, Intensity: Mild  Goals    . Weight (lb) < 145 lb (65.8 kg)     15lbs is enough or 150lb  A normal BMI (body mass index) 77 yo 27 over 65; and you are 26  Cut down on sweets; cut back on pepsi  You can but YASSO bars, which are 100 calories and ice cream with yogurt           Fall Risk Fall Risk  08/27/2017 02/10/2017 12/22/2015 11/01/2014 10/14/2013  Falls in the past year? No Yes No No Yes  Number falls in past yr: - 1 - - 1  Injury with Fall? - No - - Yes     Depression Screen PHQ 2/9 Scores 08/27/2017 02/10/2017 12/22/2015 11/01/2014  PHQ - 2 Score 0 0 0 0     Cognitive Function MMSE - Mini Mental State Exam 12/22/2015  Not completed: (No Data)     Ad8 score reviewed for issues:  Issues making decisions:  Less interest in hobbies / activities:  Repeats questions, stories (family complaining):  Trouble using ordinary gadgets (microwave, computer, phone):  Forgets the month or year:   Mismanaging finances:   Remembering appts:  Daily problems with thinking and/or memory: Ad8 score is=0  Manages her own meds and finances        Immunization History  Administered Date(s) Administered  . Influenza Split 03/13/2011, 02/21/2012  . Influenza Whole 02/20/2009, 02/22/2013  . Influenza, High Dose Seasonal PF 02/18/2014, 02/10/2017  . Influenza-Unspecified 02/14/2016  . Pneumococcal Conjugate-13 03/13/2016  . Pneumococcal Polysaccharide-23 07/04/2011  . Zoster 04/20/2013     Screening Tests Health Maintenance  Topic Date Due  . DEXA SCAN  08/28/2018 (Originally 04/27/2004)  . TETANUS/TDAP  08/28/2018 (Originally 04/27/1958)  . INFLUENZA VACCINE  12/04/2017  . PNA vac Low Risk Adult  Completed          Plan:   PCP Notes   Health Maintenance Postponing dexa or the bone density for  now; Fall education provided  Tdap will take at later time;  Or maybe pharmacy  Educated regarding shingrix   Abnormal Screens  Pulse was low but no complaints; 52 radially. Will make apt with Dr. Elease Hashimoto after May 1st  See note below   Referrals  none   Patient concerns; Dr. Gladstone Lighter wanted mylegram and she has not had it yet No plans to have this at this time   C/o sob when getting up from sitting More prevalent after coffee;  Occasionally when she goes to the mailbox, but she can do hard work in the home and it does not bother her  Pulse is around 50; (42-52 mostly around 48 )   Left calf is still swollen; dissipates during the night. No c/o of pain or redness etc.   Also her double first cousin may have dementia and she is concerned and providing care to both cousin and her spouse   Nurse Concerns; As noted    Next PCP apt/ will make apt next week; May 2 for labs and general review    I have personally reviewed and noted the following in the patient's chart:   . Medical and social history . Use of alcohol, tobacco or illicit drugs  . Current medications and supplements . Functional ability and status . Nutritional status . Physical activity . Advanced directives . List of other physicians . Hospitalizations, surgeries, and ER visits in previous 12 months . Vitals . Screenings to include cognitive, depression, and falls . Referrals and appointments  In addition, I have reviewed and discussed with patient certain preventive protocols, quality metrics, and best practice recommendations. A written personalized  care plan for preventive services as well as general preventive health recommendations were provided to patient.     GZFPO,IPPGF, RN  08/27/2017  Above assessment reviewed and agree.  Wendy Post MD [facility] at [facility]

## 2017-08-27 ENCOUNTER — Ambulatory Visit (INDEPENDENT_AMBULATORY_CARE_PROVIDER_SITE_OTHER): Payer: Medicare Other

## 2017-08-27 VITALS — BP 122/70 | HR 50 | Ht 62.0 in | Wt 162.0 lb

## 2017-08-27 DIAGNOSIS — Z0000 Encounter for general adult medical examination without abnormal findings: Secondary | ICD-10-CM | POA: Diagnosis not present

## 2017-08-27 NOTE — Patient Instructions (Addendum)
Wendy Sandoval , Thank you for taking time to come for your Medicare Wellness Visit. I appreciate your ongoing commitment to your health goals. Please review the following plan we discussed and let me know if I can assist you in the future.   Please make an apt with Dr. Elease Hashimoto after 5/1 for labs repeat physical    A Tetanus is recommended every 10 years. Medicare covers a tetanus if you have a cut or wound; otherwise, there may be a charge. If you had not had a tetanus with pertusses, known as the Tdap, you can take this anytime.   Shingrix is a vaccine for the prevention of Shingles in Adults 50 and older.  If you are on Medicare, the shingrix is covered under your Part D plan, so you will take both of the vaccines in the series at your pharmacy. Please check with your benefits regarding applicable copays or out of pocket expenses.  The Shingrix is given in 2 vaccines approx 8 weeks apart. You must receive the 2nd dose prior to 6 months from receipt of the first. Please have the pharmacist print out you Immunization  dates for our office records      These are the goals we discussed: Goals    . Weight (lb) < 145 lb (65.8 kg)     15lbs is enough or 150lb  A normal BMI (body mass index) 77 yo 27 over 65; and you are 24  Cut down on sweets; cut back on pepsi  You can but YASSO bars, which are 100 calories and ice cream with yogurt           This is a list of the screening recommended for you and due dates:  Health Maintenance  Topic Date Due  . DEXA scan (bone density measurement)  08/28/2018*  . Tetanus Vaccine  08/28/2018*  . Flu Shot  12/04/2017  . Pneumonia vaccines  Completed  *Topic was postponed. The date shown is not the original due date.     Bone Densitometry Bone densitometry is an imaging test that uses a special X-ray to measure the amount of calcium and other minerals in your bones (bone density). This test is also known as a bone mineral density test or  dual-energy X-ray absorptiometry (DXA). The test can measure bone density at your hip and your spine. It is similar to having a regular X-ray. You may have this test to:  Diagnose a condition that causes weak or thin bones (osteoporosis).  Predict your risk of a broken bone (fracture).  Determine how well osteoporosis treatment is working.  Tell a health care provider about:  Any allergies you have.  All medicines you are taking, including vitamins, herbs, eye drops, creams, and over-the-counter medicines.  Any problems you or family members have had with anesthetic medicines.  Any blood disorders you have.  Any surgeries you have had.  Any medical conditions you have.  Possibility of pregnancy.  Any other medical test you had within the previous 14 days that used contrast material. What are the risks? Generally, this is a safe procedure. However, problems can occur and may include the following:  This test exposes you to a very small amount of radiation.  The risks of radiation exposure may be greater to unborn children.  What happens before the procedure?  Do not take any calcium supplements for 24 hours before having the test. You can otherwise eat and drink what you usually do.  Take off all  metal jewelry, eyeglasses, dental appliances, and any other metal objects. What happens during the procedure?  You may lie on an exam table. There will be an X-ray generator below you and an imaging device above you.  Other devices, such as boxes or braces, may be used to position your body properly for the scan.  You will need to lie still while the machine slowly scans your body.  The images will show up on a computer monitor. What happens after the procedure? You may need more testing at a later time. This information is not intended to replace advice given to you by your health care provider. Make sure you discuss any questions you have with your health care  provider. Document Released: 05/14/2004 Document Revised: 09/28/2015 Document Reviewed: 09/30/2013 Elsevier Interactive Patient Education  2018 [place] in the Home Falls can cause injuries. They can happen to people of all ages. There are many things you can do to make your home safe and to help prevent falls. What can I do on the outside of my home?  Regularly fix the edges of walkways and driveways and fix any cracks.  Remove anything that might make you trip as you walk through a door, such as a raised step or threshold.  Trim any bushes or trees on the path to your home.  Use bright outdoor lighting.  Clear any walking paths of anything that might make someone trip, such as rocks or tools.  Regularly check to see if handrails are loose or broken. Make sure that both sides of any steps have handrails.  Any raised decks and porches should have guardrails on the edges.  Have any leaves, snow, or ice cleared regularly.  Use sand or salt on walking paths during winter.  Clean up any spills in your garage right away. This includes oil or grease spills. What can I do in the bathroom?  Use night lights.  Install grab bars by the toilet and in the tub and shower. Do not use towel bars as grab bars.  Use non-skid mats or decals in the tub or shower.  If you need to sit down in the shower, use a plastic, non-slip stool.  Keep the floor dry. Clean up any water that spills on the floor as soon as it happens.  Remove soap buildup in the tub or shower regularly.  Attach bath mats securely with double-sided non-slip rug tape.  Do not have throw rugs and other things on the floor that can make you trip. What can I do in the bedroom?  Use night lights.  Make sure that you have a light by your bed that is easy to reach.  Do not use any sheets or blankets that are too big for your bed. They should not hang down onto the floor.  Have a firm chair that has  side arms. You can use this for support while you get dressed.  Do not have throw rugs and other things on the floor that can make you trip. What can I do in the kitchen?  Clean up any spills right away.  Avoid walking on wet floors.  Keep items that you use a lot in easy-to-reach places.  If you need to reach something above you, use a strong step stool that has a grab bar.  Keep electrical cords out of the way.  Do not use floor polish or wax that makes floors slippery. If you must use wax, use  non-skid floor wax.  Do not have throw rugs and other things on the floor that can make you trip. What can I do with my stairs?  Do not leave any items on the stairs.  Make sure that there are handrails on both sides of the stairs and use them. Fix handrails that are broken or loose. Make sure that handrails are as long as the stairways.  Check any carpeting to make sure that it is firmly attached to the stairs. Fix any carpet that is loose or worn.  Avoid having throw rugs at the top or bottom of the stairs. If you do have throw rugs, attach them to the floor with carpet tape.  Make sure that you have a light switch at the top of the stairs and the bottom of the stairs. If you do not have them, ask someone to add them for you. What else can I do to help prevent falls?  Wear shoes that: ? Do not have high heels. ? Have rubber bottoms. ? Are comfortable and fit you well. ? Are closed at the toe. Do not wear sandals.  If you use a stepladder: ? Make sure that it is fully opened. Do not climb a closed stepladder. ? Make sure that both sides of the stepladder are locked into place. ? Ask someone to hold it for you, if possible.  Clearly mark and make sure that you can see: ? Any grab bars or handrails. ? First and last steps. ? Where the edge of each step is.  Use tools that help you move around (mobility aids) if they are needed. These  include: ? Canes. ? Walkers. ? Scooters. ? Crutches.  Turn on the lights when you go into a dark area. Replace any light bulbs as soon as they burn out.  Set up your furniture so you have a clear path. Avoid moving your furniture around.  If any of your floors are uneven, fix them.  If there are any pets around you, be aware of where they are.  Review your medicines with your doctor. Some medicines can make you feel dizzy. This can increase your chance of falling. Ask your doctor what other things that you can do to help prevent falls. This information is not intended to replace advice given to you by your health care provider. Make sure you discuss any questions you have with your health care provider. Document Released: 02/16/2009 Document Revised: 09/28/2015 Document Reviewed: 05/27/2014 Elsevier Interactive Patient Education  2018 [place] Maintenance, Female Adopting a healthy lifestyle and getting preventive care can go a long way to promote health and wellness. Talk with your health care provider about what schedule of regular examinations is right for you. This is a good chance for you to check in with your provider about disease prevention and staying healthy. In between checkups, there are plenty of things you can do on your own. Experts have done a lot of research about which lifestyle changes and preventive measures are most likely to keep you healthy. Ask your health care provider for more information. Weight and diet Eat a healthy diet  Be sure to include plenty of vegetables, fruits, low-fat dairy products, and lean protein.  Do not eat a lot of foods high in solid fats, added sugars, or salt.  Get regular exercise. This is one of the most important things you can do for your health. ? Most adults should exercise for at least 150 minutes each  week. The exercise should increase your heart rate and make you sweat (moderate-intensity exercise). ? Most adults  should also do strengthening exercises at least twice a week. This is in addition to the moderate-intensity exercise.  Maintain a healthy weight  Body mass index (BMI) is a measurement that can be used to identify possible weight problems. It estimates body fat based on height and weight. Your health care provider can help determine your BMI and help you achieve or maintain a healthy weight.  For females 40 years of age and older: ? A BMI below 18.5 is considered underweight. ? A BMI of 18.5 to 24.9 is normal. ? A BMI of 25 to 29.9 is considered overweight. ? A BMI of 30 and above is considered obese.  Watch levels of cholesterol and blood lipids  You should start having your blood tested for lipids and cholesterol at 79 years of age, then have this test every 5 years.  You may need to have your cholesterol levels checked more often if: ? Your lipid or cholesterol levels are high. ? You are older than 79 years of age. ? You are at high risk for heart disease.  Cancer screening Lung Cancer  Lung cancer screening is recommended for adults 57-21 years old who are at high risk for lung cancer because of a history of smoking.  A yearly low-dose CT scan of the lungs is recommended for people who: ? Currently smoke. ? Have quit within the past 15 years. ? Have at least a 30-pack-year history of smoking. A pack year is smoking an average of one pack of cigarettes a day for 1 year.  Yearly screening should continue until it has been 15 years since you quit.  Yearly screening should stop if you develop a health problem that would prevent you from having lung cancer treatment.  Breast Cancer  Practice breast self-awareness. This means understanding how your breasts normally appear and feel.  It also means doing regular breast self-exams. Let your health care provider know about any changes, no matter how small.  If you are in your 20s or 30s, you should have a clinical breast exam (CBE)  by a health care provider every 1-3 years as part of a regular health exam.  If you are 94 or older, have a CBE every year. Also consider having a breast X-ray (mammogram) every year.  If you have a family history of breast cancer, talk to your health care provider about genetic screening.  If you are at high risk for breast cancer, talk to your health care provider about having an MRI and a mammogram every year.  Breast cancer gene (BRCA) assessment is recommended for women who have family members with BRCA-related cancers. BRCA-related cancers include: ? Breast. ? Ovarian. ? Tubal. ? Peritoneal cancers.  Results of the assessment will determine the need for genetic counseling and BRCA1 and BRCA2 testing.  Cervical Cancer Your health care provider may recommend that you be screened regularly for cancer of the pelvic organs (ovaries, uterus, and vagina). This screening involves a pelvic examination, including checking for microscopic changes to the surface of your cervix (Pap test). You may be encouraged to have this screening done every 3 years, beginning at age 70.  For women ages 84-65, health care providers may recommend pelvic exams and Pap testing every 3 years, or they may recommend the Pap and pelvic exam, combined with testing for human papilloma virus (HPV), every 5 years. Some types of HPV  increase your risk of cervical cancer. Testing for HPV may also be done on women of any age with unclear Pap test results.  Other health care providers may not recommend any screening for nonpregnant women who are considered low risk for pelvic cancer and who do not have symptoms. Ask your health care provider if a screening pelvic exam is right for you.  If you have had past treatment for cervical cancer or a condition that could lead to cancer, you need Pap tests and screening for cancer for at least 20 years after your treatment. If Pap tests have been discontinued, your risk factors (such as  having a new sexual partner) need to be reassessed to determine if screening should resume. Some women have medical problems that increase the chance of getting cervical cancer. In these cases, your health care provider may recommend more frequent screening and Pap tests.  Colorectal Cancer  This type of cancer can be detected and often prevented.  Routine colorectal cancer screening usually begins at 79 years of age and continues through 79 years of age.  Your health care provider may recommend screening at an earlier age if you have risk factors for colon cancer.  Your health care provider may also recommend using home test kits to check for hidden blood in the stool.  A small camera at the end of a tube can be used to examine your colon directly (sigmoidoscopy or colonoscopy). This is done to check for the earliest forms of colorectal cancer.  Routine screening usually begins at age 56.  Direct examination of the colon should be repeated every 5-10 years through 79 years of age. However, you may need to be screened more often if early forms of precancerous polyps or small growths are found.  Skin Cancer  Check your skin from head to toe regularly.  Tell your health care provider about any new moles or changes in moles, especially if there is a change in a mole's shape or color.  Also tell your health care provider if you have a mole that is larger than the size of a pencil eraser.  Always use sunscreen. Apply sunscreen liberally and repeatedly throughout the day.  Protect yourself by wearing long sleeves, pants, a wide-brimmed hat, and sunglasses whenever you are outside.  Heart disease, diabetes, and high blood pressure  High blood pressure causes heart disease and increases the risk of stroke. High blood pressure is more likely to develop in: ? People who have blood pressure in the high end of the normal range (130-139/85-89 mm Hg). ? People who are overweight or  obese. ? People who are African American.  If you are 70-29 years of age, have your blood pressure checked every 3-5 years. If you are 5 years of age or older, have your blood pressure checked every year. You should have your blood pressure measured twice-once when you are at a hospital or clinic, and once when you are not at a hospital or clinic. Record the average of the two measurements. To check your blood pressure when you are not at a hospital or clinic, you can use: ? An automated blood pressure machine at a pharmacy. ? A home blood pressure monitor.  If you are between 24 years and 45 years old, ask your health care provider if you should take aspirin to prevent strokes.  Have regular diabetes screenings. This involves taking a blood sample to check your fasting blood sugar level. ? If you are at a  normal weight and have a low risk for diabetes, have this test once every three years after 79 years of age. ? If you are overweight and have a high risk for diabetes, consider being tested at a younger age or more often. Preventing infection Hepatitis B  If you have a higher risk for hepatitis B, you should be screened for this virus. You are considered at high risk for hepatitis B if: ? You were born in a country where hepatitis B is common. Ask your health care provider which countries are considered high risk. ? Your parents were born in a high-risk country, and you have not been immunized against hepatitis B (hepatitis B vaccine). ? You have HIV or AIDS. ? You use needles to inject street drugs. ? You live with someone who has hepatitis B. ? You have had sex with someone who has hepatitis B. ? You get hemodialysis treatment. ? You take certain medicines for conditions, including cancer, organ transplantation, and autoimmune conditions.  Hepatitis C  Blood testing is recommended for: ? Everyone born from [birth year] through 1967. ? Anyone with known risk factors for hepatitis  C.  Sexually transmitted infections (STIs)  You should be screened for sexually transmitted infections (STIs) including gonorrhea and chlamydia if: ? You are sexually active and are younger than 79 years of age. ? You are older than 79 years of age and your health care provider tells you that you are at risk for this type of infection. ? Your sexual activity has changed since you were last screened and you are at an increased risk for chlamydia or gonorrhea. Ask your health care provider if you are at risk.  If you do not have HIV, but are at risk, it may be recommended that you take a prescription medicine daily to prevent HIV infection. This is called pre-exposure prophylaxis (PrEP). You are considered at risk if: ? You are sexually active and do not regularly use condoms or know the HIV status of your partner(s). ? You take drugs by injection. ? You are sexually active with a partner who has HIV.  Talk with your health care provider about whether you are at high risk of being infected with HIV. If you choose to begin PrEP, you should first be tested for HIV. You should then be tested every 3 months for as long as you are taking PrEP. Pregnancy  If you are premenopausal and you may become pregnant, ask your health care provider about preconception counseling.  If you may become pregnant, take 400 to 800 micrograms (mcg) of folic acid every day.  If you want to prevent pregnancy, talk to your health care provider about birth control (contraception). Osteoporosis and menopause  Osteoporosis is a disease in which the bones lose minerals and strength with aging. This can result in serious bone fractures. Your risk for osteoporosis can be identified using a bone density scan.  If you are 6 years of age or older, or if you are at risk for osteoporosis and fractures, ask your health care provider if you should be screened.  Ask your health care provider whether you should take a calcium or  vitamin D supplement to lower your risk for osteoporosis.  Menopause may have certain physical symptoms and risks.  Hormone replacement therapy may reduce some of these symptoms and risks. Talk to your health care provider about whether hormone replacement therapy is right for you. Follow these instructions at home:  Schedule regular health, dental, and  eye exams.  Stay current with your immunizations.  Do not use any tobacco products including cigarettes, chewing tobacco, or electronic cigarettes.  If you are pregnant, do not drink alcohol.  If you are breastfeeding, limit how much and how often you drink alcohol.  Limit alcohol intake to no more than 1 drink per day for nonpregnant women. One drink equals 12 ounces of beer, 5 ounces of wine, or 1 ounces of hard liquor.  Do not use street drugs.  Do not share needles.  Ask your health care provider for help if you need support or information about quitting drugs.  Tell your health care provider if you often feel depressed.  Tell your health care provider if you have ever been abused or do not feel safe at home. This information is not intended to replace advice given to you by your health care provider. Make sure you discuss any questions you have with your health care provider. Document Released: 11/05/2010 Document Revised: 09/28/2015 Document Reviewed: 01/24/2015 Elsevier Interactive Patient Education  [employer].

## 2017-09-01 ENCOUNTER — Other Ambulatory Visit: Payer: Self-pay | Admitting: Family Medicine

## 2017-09-08 DIAGNOSIS — R05 Cough: Secondary | ICD-10-CM | POA: Diagnosis not present

## 2017-09-08 DIAGNOSIS — J40 Bronchitis, not specified as acute or chronic: Secondary | ICD-10-CM | POA: Diagnosis not present

## 2017-09-11 ENCOUNTER — Ambulatory Visit
Admission: RE | Admit: 2017-09-11 | Discharge: 2017-09-11 | Disposition: A | Discharge status: Home / Self Care | Payer: Medicare Other | Source: Ambulatory Visit | Attending: Family Medicine | Admitting: Family Medicine

## 2017-09-11 DIAGNOSIS — Z1231 Encounter for screening mammogram for malignant neoplasm of breast: Secondary | ICD-10-CM

## 2017-10-03 DIAGNOSIS — L309 Dermatitis, unspecified: Secondary | ICD-10-CM | POA: Diagnosis not present

## 2017-10-03 DIAGNOSIS — L570 Actinic keratosis: Secondary | ICD-10-CM | POA: Diagnosis not present

## 2017-10-03 DIAGNOSIS — D485 Neoplasm of uncertain behavior of skin: Secondary | ICD-10-CM | POA: Diagnosis not present

## 2017-10-06 ENCOUNTER — Other Ambulatory Visit: Payer: Self-pay | Admitting: Family Medicine

## 2017-10-12 ENCOUNTER — Other Ambulatory Visit: Payer: Self-pay | Admitting: Family Medicine

## 2017-10-17 ENCOUNTER — Ambulatory Visit (INDEPENDENT_AMBULATORY_CARE_PROVIDER_SITE_OTHER): Payer: Medicare Other | Admitting: Family Medicine

## 2017-10-17 ENCOUNTER — Encounter: Payer: Self-pay | Admitting: Family Medicine

## 2017-10-17 VITALS — BP 120/74 | HR 58 | Temp 98.5°F | Ht 61.25 in | Wt 159.2 lb

## 2017-10-17 DIAGNOSIS — E7800 Pure hypercholesterolemia, unspecified: Secondary | ICD-10-CM | POA: Diagnosis not present

## 2017-10-17 DIAGNOSIS — Z0000 Encounter for general adult medical examination without abnormal findings: Secondary | ICD-10-CM | POA: Diagnosis not present

## 2017-10-17 DIAGNOSIS — I10 Essential (primary) hypertension: Secondary | ICD-10-CM

## 2017-10-17 LAB — LIPID PANEL
CHOL/HDL RATIO: 3
CHOLESTEROL: 135 mg/dL (ref 0–200)
HDL: 44.1 mg/dL (ref >39.00)
LDL Cholesterol: 70 mg/dL (ref 0–99)
NonHDL: 91.06
Triglycerides: 106 mg/dL (ref 0.0–149.0)
VLDL: 21.2 mg/dL (ref 0.0–40.0)

## 2017-10-17 LAB — HEPATIC FUNCTION PANEL
ALK PHOS: 71 U/L (ref 39–117)
ALT: 10 U/L (ref 0–35)
AST: 14 U/L (ref 0–37)
Albumin: 4.2 g/dL (ref 3.5–5.2)
BILIRUBIN DIRECT: 0.1 mg/dL (ref 0.0–0.3)
TOTAL PROTEIN: 6.7 g/dL (ref 6.0–8.3)
Total Bilirubin: 0.7 mg/dL (ref 0.2–1.2)

## 2017-10-17 LAB — BASIC METABOLIC PANEL
BUN: 16 mg/dL (ref 6–23)
CALCIUM: 9.7 mg/dL (ref 8.4–10.5)
CO2: 28 mEq/L (ref 19–32)
CREATININE: 0.71 mg/dL (ref 0.40–1.20)
Chloride: 103 mEq/L (ref 96–112)
GFR: 84.52 mL/min (ref >60.00)
Glucose, Bld: 97 mg/dL (ref 70–99)
Potassium: 4.1 mEq/L (ref 3.5–5.1)
Sodium: 139 mEq/L (ref 135–145)
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
## 2017-10-20 ENCOUNTER — Telehealth: Payer: Self-pay | Admitting: Family Medicine

## 2017-10-20 NOTE — Telephone Encounter (Signed)
Copied from [place] [id number]. Topic: Quick Communication - Lab Results >> Oct 20, 2017  3:08 PM Lamarr Lulas, CMA wrote: Called patient to inform them of  lab results. When patient returns call, triage nurse may disclose results.  [phone]

## 2017-10-21 NOTE — Telephone Encounter (Signed)
Left message on voicemail for pt to return call to office for results. See result note   

## 2017-10-21 NOTE — Telephone Encounter (Signed)
Pt calling back and ask please call on cell as she will be leaving. [phone]

## 2017-11-13 DIAGNOSIS — N3020 Other chronic cystitis without hematuria: Secondary | ICD-10-CM | POA: Diagnosis not present

## 2017-12-04 ENCOUNTER — Other Ambulatory Visit: Payer: Self-pay | Admitting: Family Medicine

## 2017-12-20 IMAGING — US US ABDOMEN COMPLETE
1 series · 13 of 25 positions shown · non-contrast
Comparison: CT urogram January 26, 2015

CLINICAL DATA: Right upper quadrant pain for the past 4 years.
History of previous appendectomy.

EXAM:
ABDOMEN ULTRASOUND COMPLETE

[Series 1: us abdomen complete · 0.14mm/px · 13 of 77 slices shown]
[im 1/77]
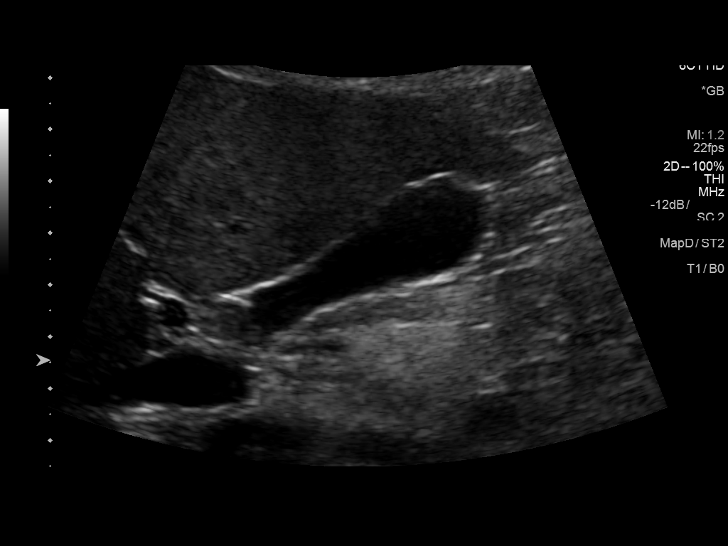
[im 7/77]
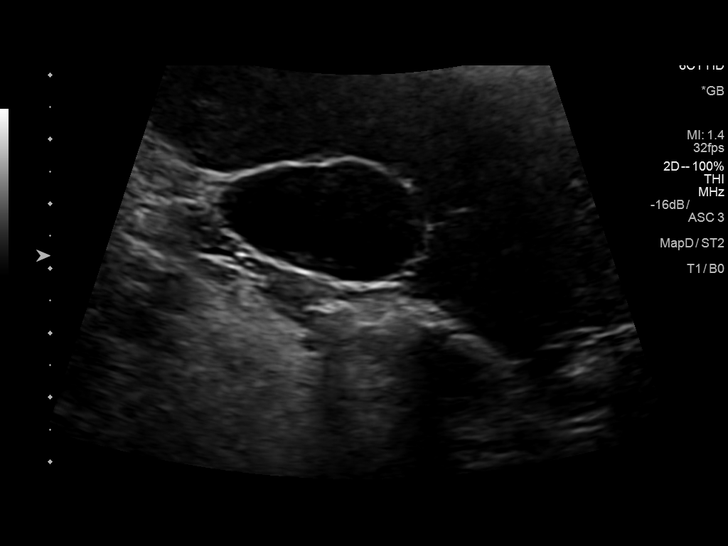
[im 13/77]
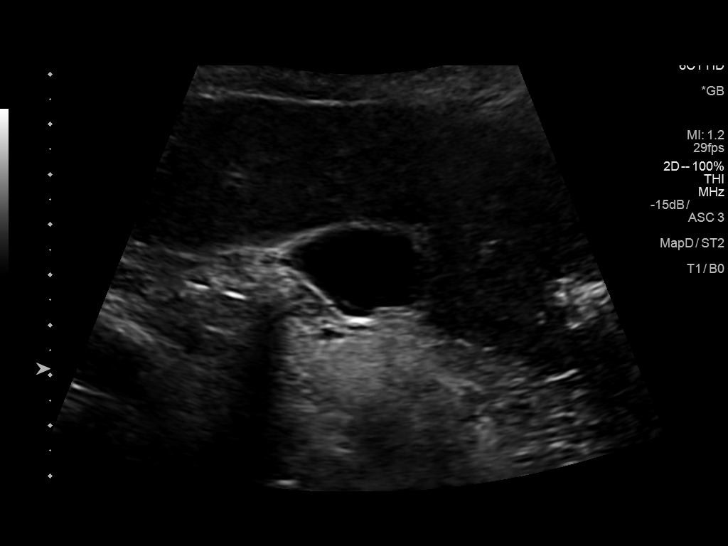
[im 20/77]
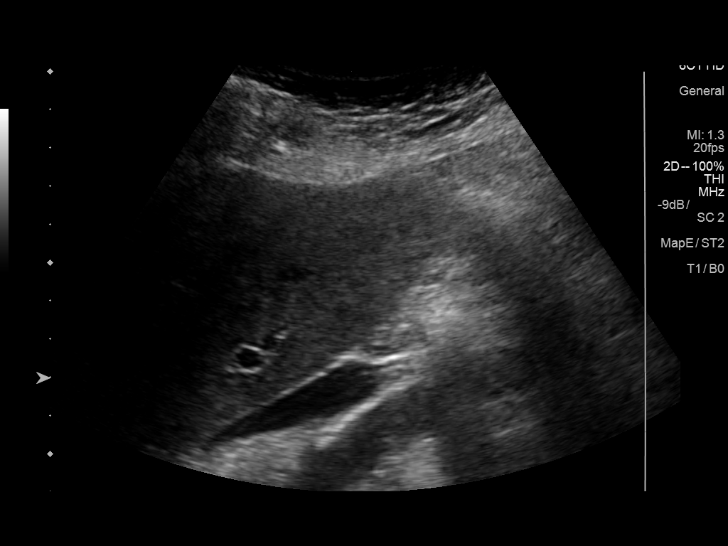
[im 26/77]
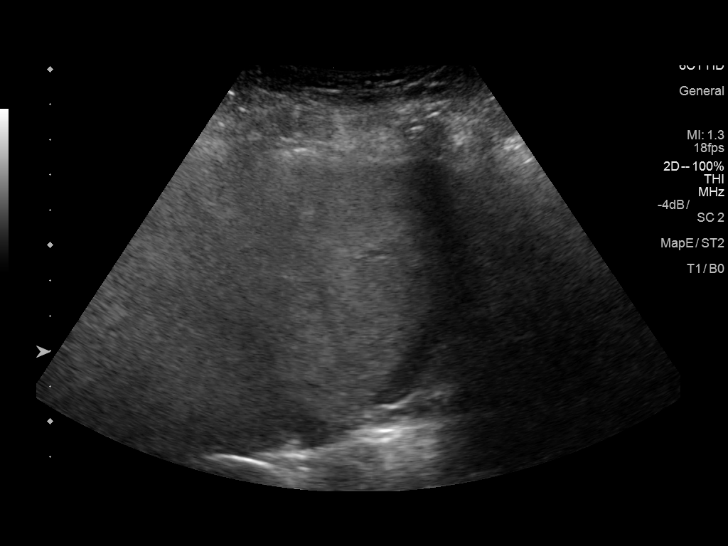
[im 32/77]
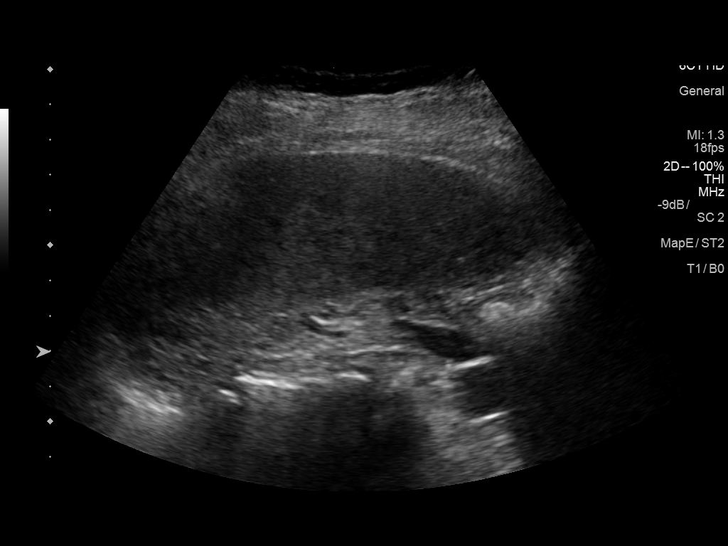
[im 39/77]
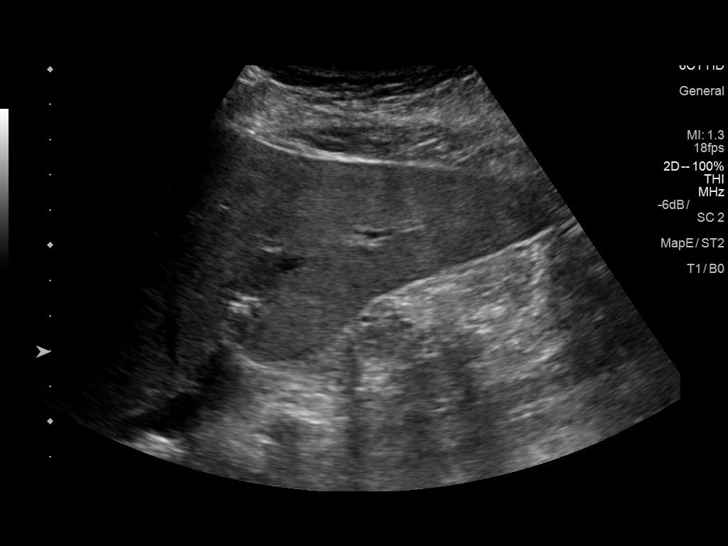
[im 45/77]
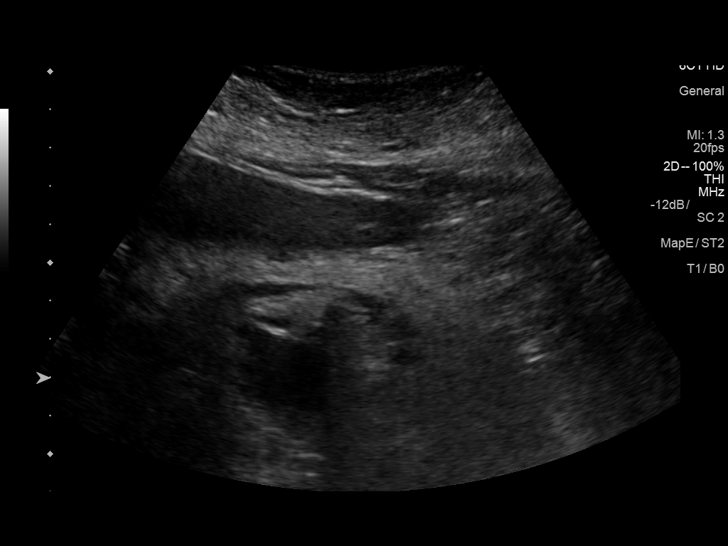
[im 51/77]
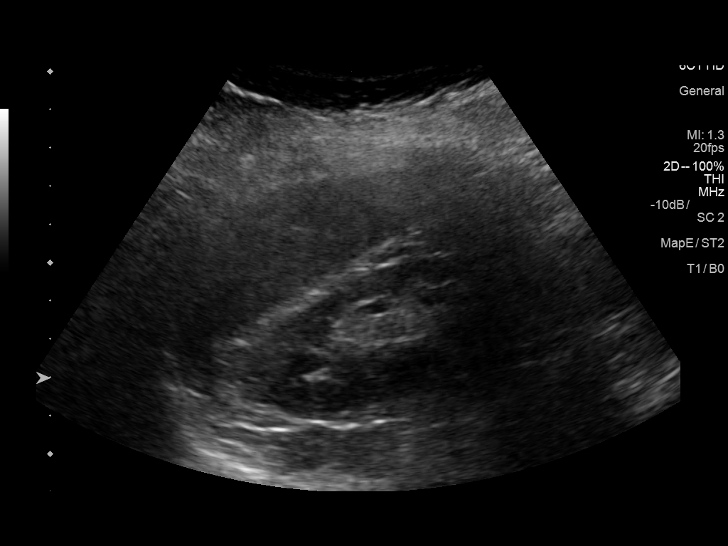
[im 58/77]
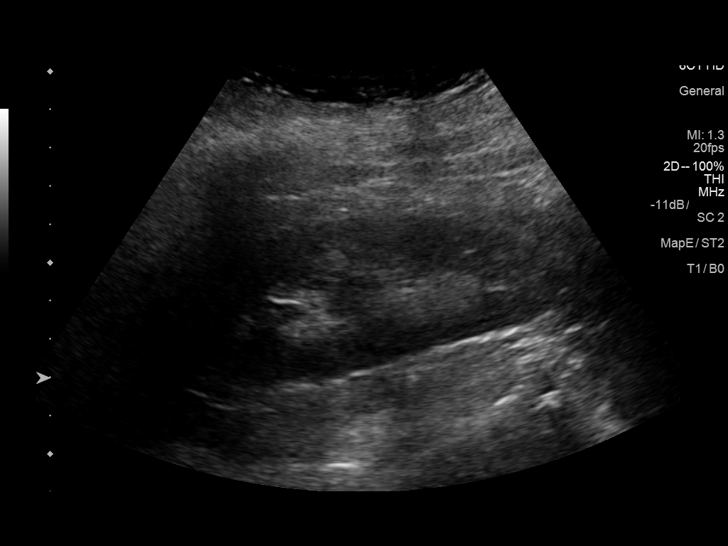
[im 64/77]
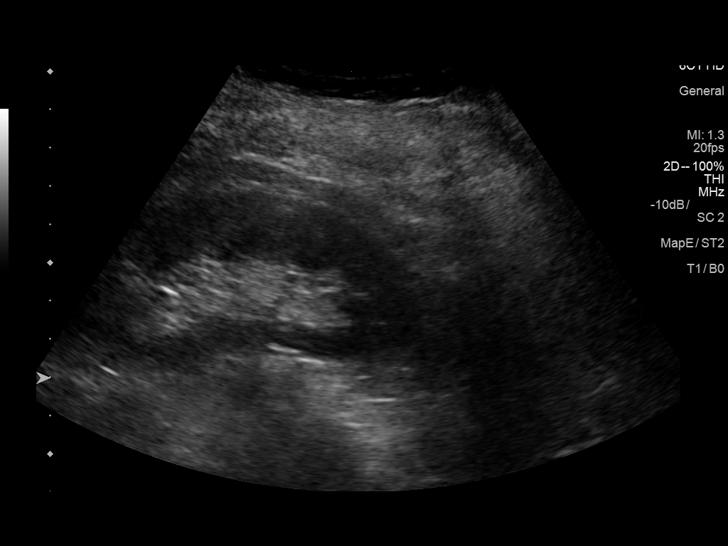
[im 70/77]
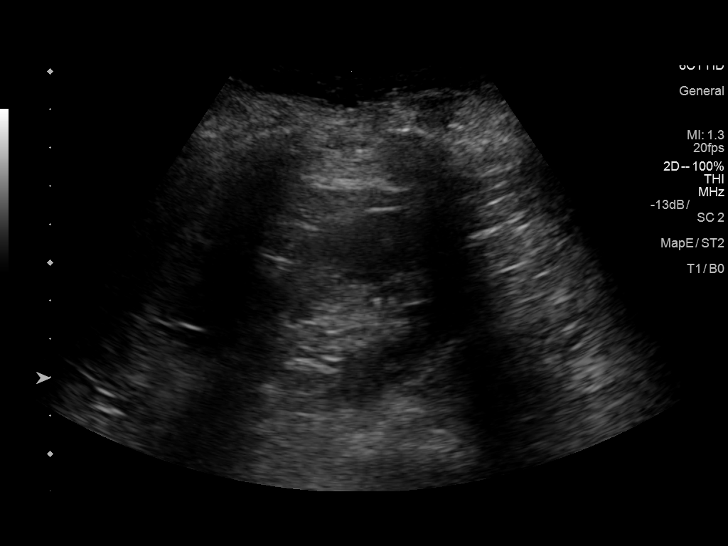
[im 77/77]
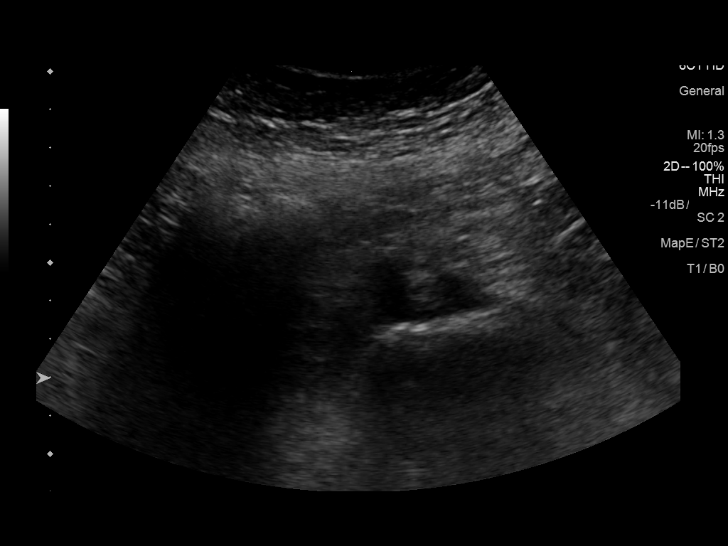

[13 of 25 positions shown; findings below may reference images not displayed]

FINDINGS: Gallbladder: No gallstones or wall thickening visualized. No
sonographic Murphy sign noted by sonographer.

Common bile duct: Diameter: 4.1 mm

Liver: The hepatic echotexture is within the limits of normal. There
is no focal mass nor ductal dilation. The surface contour of the
liver is smooth.

IVC: Bowel gas limits evaluation of the IVC.

Pancreas: The pancreatic head and body appear normal. Portions of
the pancreatic tail were obscured by bowel gas.

Spleen: Size and appearance within normal limits.

Right Kidney: Length: 10.7 cm. Echogenicity within normal limits. No
mass or hydronephrosis visualized.

Left Kidney: Length: 10.1 cm. Echogenicity within normal limits. No
mass or hydronephrosis visualized.

Abdominal aorta: No aneurysm visualized.

Other findings: There is no ascites.
IMPRESSION: No gallstones or sonographic evidence of acute cholecystitis. If
there are clinical concerns of chronic gallbladder dysfunction, a
nuclear medicine hepatobiliary scan with gallbladder ejection
fraction determination may be useful.

No acute abnormality observed within the abdomen.

## 2018-01-10 ENCOUNTER — Other Ambulatory Visit: Payer: Self-pay | Admitting: Family Medicine

## 2018-02-22 ENCOUNTER — Other Ambulatory Visit: Payer: Self-pay | Admitting: Family Medicine

## 2018-04-08 ENCOUNTER — Other Ambulatory Visit: Payer: Self-pay | Admitting: Family Medicine

## 2018-04-10 ENCOUNTER — Other Ambulatory Visit: Payer: Self-pay | Admitting: Family Medicine

## 2018-04-19 ENCOUNTER — Other Ambulatory Visit: Payer: Self-pay | Admitting: Family Medicine

## 2018-05-08 ENCOUNTER — Encounter: Payer: Self-pay | Admitting: Family Medicine

## 2018-05-08 ENCOUNTER — Other Ambulatory Visit: Payer: Self-pay

## 2018-05-08 ENCOUNTER — Ambulatory Visit: Payer: Medicare Other | Admitting: Family Medicine

## 2018-05-08 VITALS — BP 128/82 | HR 52 | Temp 98.0°F | Ht 61.25 in | Wt 159.5 lb

## 2018-05-08 DIAGNOSIS — F339 Major depressive disorder, recurrent, unspecified: Secondary | ICD-10-CM

## 2018-05-08 DIAGNOSIS — K219 Gastro-esophageal reflux disease without esophagitis: Secondary | ICD-10-CM

## 2018-05-08 DIAGNOSIS — I10 Essential (primary) hypertension: Secondary | ICD-10-CM

## 2018-05-08 DIAGNOSIS — E7800 Pure hypercholesterolemia, unspecified: Secondary | ICD-10-CM | POA: Diagnosis not present

## 2018-05-08 NOTE — Progress Notes (Signed)
Subjective:     Patient ID: Wendy Sandoval, female   DOB: 11-18-1940, 78 y.o.   MRN: [id number]  HPI Patient has chronic problems including hypertension, GERD, irritable bowel syndrome, fatty liver, history of recurrent depression.  She is here today for medical follow-up.  She states she has had some dry cough for years and she thinks this may be related to lisinopril.  No appetite or weight changes.  No hemoptysis.  Non-smoker.  Cough is relatively mild.  She is not interested in stopping her lisinopril at this time.  Occasional postnasal drip symptoms.  No uncontrolled/active GERD symptoms  She has cataracts and is looking at upcoming surgery likely in the next couple of months.  She has follow-up with ophthalmologist in February.  Medications reviewed.  She remains on HCTZ, lisinopril, metoprolol, and amlodipine for hypertension and blood pressure well controlled.  She takes simvastatin for hyperlipidemia.  No myalgias.  No dizziness.  No headaches.  No chest pain.  Past Medical History:  Diagnosis Date  . Chronic UTI (urinary tract infection)    recurrent  . Depression   . DIVERTICULOSIS, COLON 03/17/2007  . DJD (degenerative joint disease)   . Esophageal stricture   . Fatty liver   . GERD 08/09/2008  . Headache(784.0)   . Hiatal hernia   . HYPERCHOLESTEROLEMIA 07/07/2007  . HYPERTENSION 07/07/2007  . IBS (irritable bowel syndrome)   . Schatzki's ring   . TMJ syndrome    Past Surgical History:  Procedure Laterality Date  . ABDOMINAL HYSTERECTOMY  1998  . APPENDECTOMY    . BREAST EXCISIONAL BIOPSY Left    Benign   . BREAST SURGERY     milk gland  . LUNG SURGERY  1983    reports that she has never smoked. She has never used smokeless tobacco. She reports that she does not drink alcohol or use drugs. family history includes Alcohol abuse in an other family member; Arthritis in her mother and another family member; Diabetes in an other family member; Heart disease in an other  family member; Hyperlipidemia in an other family member; Hypertension in an other family member; Stroke in an other family member; Thyroid disease in her father. Allergies  Allergen Reactions  . Codeine Sulfate     REACTION: nausea  . Histamine     Anxious feeling     Review of Systems  Constitutional: Negative for appetite change, chills, fatigue, fever and unexpected weight change.  HENT: Negative for sinus pressure and sinus pain.   Eyes: Negative for visual disturbance.  Respiratory: Positive for cough. Negative for chest tightness, shortness of breath and wheezing.   Cardiovascular: Negative for chest pain, palpitations and leg swelling.  Neurological: Negative for dizziness, seizures, syncope, weakness, light-headedness and headaches.       Objective:   Physical Exam Constitutional:      Appearance: She is well-developed.  Eyes:     Pupils: Pupils are equal, round, and reactive to light.  Neck:     Musculoskeletal: Neck supple.     Thyroid: No thyromegaly.     Vascular: No JVD.  Cardiovascular:     Rate and Rhythm: Normal rate and regular rhythm.     Heart sounds: No gallop.   Pulmonary:     Effort: Pulmonary effort is normal. No respiratory distress.     Breath sounds: Normal breath sounds. No wheezing or rales.  Neurological:     Mental Status: She is alert.  Assessment:     #1 hypertension stable and at goal  #2 mild dry cough possibly related ACE inhibitor.  #3 GERD controlled with low-dose Nexium  #4 history of hyperlipidemia currently on simvastatin    Plan:     -We discussed options of switching her lisinopril to angiotensin receptor blocker and at this point she declines -Continue current medications -We will plan 6-month routine follow-up and check labs that point with lipids, hepatic, basic metabolic panel  Eulas Post MD [facility] at [facility]

## 2018-05-20 ENCOUNTER — Other Ambulatory Visit: Payer: Self-pay | Admitting: Family Medicine

## 2018-05-24 DIAGNOSIS — R300 Dysuria: Secondary | ICD-10-CM | POA: Diagnosis not present

## 2018-05-24 DIAGNOSIS — N3000 Acute cystitis without hematuria: Secondary | ICD-10-CM | POA: Diagnosis not present

## 2018-06-16 DIAGNOSIS — H2513 Age-related nuclear cataract, bilateral: Secondary | ICD-10-CM | POA: Diagnosis not present

## 2018-06-16 DIAGNOSIS — H25013 Cortical age-related cataract, bilateral: Secondary | ICD-10-CM | POA: Diagnosis not present

## 2018-06-16 DIAGNOSIS — H43813 Vitreous degeneration, bilateral: Secondary | ICD-10-CM | POA: Diagnosis not present

## 2018-06-16 DIAGNOSIS — H524 Presbyopia: Secondary | ICD-10-CM | POA: Diagnosis not present

## 2018-07-03 ENCOUNTER — Other Ambulatory Visit: Payer: Self-pay | Admitting: Family Medicine

## 2018-07-09 DIAGNOSIS — H25011 Cortical age-related cataract, right eye: Secondary | ICD-10-CM | POA: Diagnosis not present

## 2018-07-09 DIAGNOSIS — H2511 Age-related nuclear cataract, right eye: Secondary | ICD-10-CM | POA: Diagnosis not present

## 2018-07-09 DIAGNOSIS — H25811 Combined forms of age-related cataract, right eye: Secondary | ICD-10-CM | POA: Diagnosis not present

## 2018-07-15 ENCOUNTER — Other Ambulatory Visit: Payer: Self-pay | Admitting: Family Medicine

## 2018-07-24 DIAGNOSIS — N3020 Other chronic cystitis without hematuria: Secondary | ICD-10-CM | POA: Diagnosis not present

## 2018-08-05 ENCOUNTER — Telehealth: Payer: Self-pay

## 2018-08-05 NOTE — Telephone Encounter (Signed)
Author phoned pt. to assess interest in rescheduling awv for virtual awv. Pt. stated she did not have a computer or smartphone, but would be willing to reschedule for later in the year. Appointment made for 7/23 with [facility].

## 2018-08-31 ENCOUNTER — Other Ambulatory Visit: Payer: Self-pay | Admitting: Family Medicine

## 2018-09-02 ENCOUNTER — Ambulatory Visit: Payer: Medicare Other

## 2018-09-22 ENCOUNTER — Other Ambulatory Visit: Payer: Self-pay | Admitting: Family Medicine

## 2018-09-22 DIAGNOSIS — Z1231 Encounter for screening mammogram for malignant neoplasm of breast: Secondary | ICD-10-CM

## 2018-10-12 ENCOUNTER — Other Ambulatory Visit: Payer: Self-pay | Admitting: Family Medicine

## 2018-11-04 DIAGNOSIS — H2512 Age-related nuclear cataract, left eye: Secondary | ICD-10-CM | POA: Diagnosis not present

## 2018-11-04 DIAGNOSIS — H43812 Vitreous degeneration, left eye: Secondary | ICD-10-CM | POA: Diagnosis not present

## 2018-11-04 DIAGNOSIS — H25012 Cortical age-related cataract, left eye: Secondary | ICD-10-CM | POA: Diagnosis not present

## 2018-11-11 ENCOUNTER — Other Ambulatory Visit: Payer: Self-pay

## 2018-11-11 ENCOUNTER — Ambulatory Visit
Admission: RE | Admit: 2018-11-11 | Discharge: 2018-11-11 | Disposition: A | Discharge status: Home / Self Care | Payer: Medicare Other | Source: Ambulatory Visit | Attending: Family Medicine | Admitting: Family Medicine

## 2018-11-11 DIAGNOSIS — Z1231 Encounter for screening mammogram for malignant neoplasm of breast: Secondary | ICD-10-CM

## 2018-11-18 DIAGNOSIS — R109 Unspecified abdominal pain: Secondary | ICD-10-CM | POA: Diagnosis not present

## 2018-11-18 DIAGNOSIS — N3020 Other chronic cystitis without hematuria: Secondary | ICD-10-CM | POA: Diagnosis not present

## 2018-11-23 ENCOUNTER — Other Ambulatory Visit: Payer: Self-pay | Admitting: Family Medicine

## 2018-11-26 ENCOUNTER — Ambulatory Visit: Payer: Medicare Other

## 2018-12-01 ENCOUNTER — Encounter: Payer: Self-pay | Admitting: Family Medicine

## 2018-12-01 ENCOUNTER — Ambulatory Visit: Payer: Medicare Other | Admitting: Family Medicine

## 2018-12-01 ENCOUNTER — Other Ambulatory Visit: Payer: Self-pay

## 2018-12-01 VITALS — BP 128/68 | HR 55 | Temp 98.1°F | Ht 61.25 in | Wt 153.7 lb

## 2018-12-01 DIAGNOSIS — K219 Gastro-esophageal reflux disease without esophagitis: Secondary | ICD-10-CM | POA: Diagnosis not present

## 2018-12-01 DIAGNOSIS — I10 Essential (primary) hypertension: Secondary | ICD-10-CM

## 2018-12-01 DIAGNOSIS — F339 Major depressive disorder, recurrent, unspecified: Secondary | ICD-10-CM

## 2018-12-01 DIAGNOSIS — E7800 Pure hypercholesterolemia, unspecified: Secondary | ICD-10-CM

## 2018-12-01 LAB — BASIC METABOLIC PANEL
BUN: 14 mg/dL (ref 6–23)
CO2: 30 mEq/L (ref 19–32)
Calcium: 9.6 mg/dL (ref 8.4–10.5)
Chloride: 104 mEq/L (ref 96–112)
Creatinine, Ser: 0.65 mg/dL (ref 0.40–1.20)
GFR: 87.79 mL/min (ref >60.00)
Glucose, Bld: 96 mg/dL (ref 70–99)
Potassium: 4.5 mEq/L (ref 3.5–5.1)
Sodium: 140 mEq/L (ref 135–145)

## 2018-12-01 LAB — HEPATIC FUNCTION PANEL
ALT: 11 U/L (ref 0–35)
AST: 15 U/L (ref 0–37)
Albumin: 4.1 g/dL (ref 3.5–5.2)
Alkaline Phosphatase: 64 U/L (ref 39–117)
Bilirubin, Direct: 0.1 mg/dL (ref 0.0–0.3)
Total Bilirubin: 0.6 mg/dL (ref 0.2–1.2)
Total Protein: 6.7 g/dL (ref 6.0–8.3)

## 2018-12-01 LAB — LIPID PANEL
Cholesterol: 135 mg/dL (ref 0–200)
HDL: 43.9 mg/dL (ref >39.00)
LDL Cholesterol: 70 mg/dL (ref 0–99)
NonHDL: 90.88
Total CHOL/HDL Ratio: 3
Triglycerides: 104 mg/dL (ref 0.0–149.0)
VLDL: 20.8 mg/dL (ref 0.0–40.0)

## 2018-12-01 MED ORDER — AMLODIPINE BESYLATE 5 MG PO TABS: 5.0000 mg | ORAL_TABLET | Freq: Every day | ORAL | 3 refills | Status: DC

## 2018-12-01 MED ORDER — ESCITALOPRAM OXALATE 10 MG PO TABS: 5.0000 mg | ORAL_TABLET | Freq: Every day | ORAL | 3 refills | Status: DC

## 2018-12-01 MED ORDER — HYDROCHLOROTHIAZIDE 25 MG PO TABS: 25.0000 mg | ORAL_TABLET | Freq: Every day | ORAL | 3 refills | Status: DC

## 2018-12-01 MED ORDER — SIMVASTATIN 40 MG PO TABS: 40.0000 mg | ORAL_TABLET | Freq: Every day | ORAL | 3 refills | Status: DC

## 2018-12-01 MED ORDER — LISINOPRIL 10 MG PO TABS: 10.0000 mg | ORAL_TABLET | Freq: Every day | ORAL | 3 refills | Status: DC

## 2018-12-01 MED ORDER — METOPROLOL TARTRATE 50 MG PO TABS: 25.0000 mg | ORAL_TABLET | Freq: Two times a day (BID) | ORAL | 3 refills | Status: DC

## 2018-12-01 NOTE — Progress Notes (Signed)
Subjective:     Patient ID: Wendy Sandoval, female   DOB: 06/12/1940, 78 y.o.   MRN: [id number]  HPI Patient is seen for medical follow-up.  Her chronic problems include history of hypertension, hyperlipidemia, osteoarthritis, recurrent depression, GERD, and fatty liver changes.  She feels her depression is stable on low-dose Lexapro.  Her daughter was recently diagnosed with breast cancer but she is handling that fairly well.  She just got back from her grandsons wedding recently.  Medications reviewed and compliant with all.  Denies any side effects.  No recent chest pains.  No recent falls.  Needs refills of all medications.  She is also due for labs.  Past Medical History:  Diagnosis Date  . Chronic UTI (urinary tract infection)    recurrent  . Depression   . DIVERTICULOSIS, COLON 03/17/2007  . DJD (degenerative joint disease)   . Esophageal stricture   . Fatty liver   . GERD 08/09/2008  . Headache(784.0)   . Hiatal hernia   . HYPERCHOLESTEROLEMIA 07/07/2007  . HYPERTENSION 07/07/2007  . IBS (irritable bowel syndrome)   . Schatzki's ring   . TMJ syndrome    Past Surgical History:  Procedure Laterality Date  . ABDOMINAL HYSTERECTOMY  1998  . APPENDECTOMY    . BREAST EXCISIONAL BIOPSY Left    Benign   . BREAST SURGERY     milk gland  . LUNG SURGERY  1983    reports that she has never smoked. She has never used smokeless tobacco. She reports that she does not drink alcohol or use drugs. family history includes Alcohol abuse in an other family member; Arthritis in her mother and another family member; Diabetes in an other family member; Heart disease in an other family member; Hyperlipidemia in an other family member; Hypertension in an other family member; Stroke in an other family member; Thyroid disease in her father. Allergies  Allergen Reactions  . Codeine Sulfate     REACTION: nausea  . Histamine     Anxious feeling     Review of Systems  Constitutional: Negative  for appetite change, fatigue and unexpected weight change.  Eyes: Negative for visual disturbance.  Respiratory: Negative for cough, chest tightness, shortness of breath and wheezing.   Cardiovascular: Negative for chest pain, palpitations and leg swelling.  Endocrine: Negative for polydipsia and polyuria.  Musculoskeletal: Positive for arthralgias.  Neurological: Negative for dizziness, seizures, syncope, weakness, light-headedness and headaches.       Objective:   Physical Exam Constitutional:      Appearance: She is well-developed.  Eyes:     Pupils: Pupils are equal, round, and reactive to light.  Neck:     Musculoskeletal: Neck supple.     Thyroid: No thyromegaly.     Vascular: No JVD.  Cardiovascular:     Rate and Rhythm: Normal rate and regular rhythm.     Heart sounds: No gallop.   Pulmonary:     Effort: Pulmonary effort is normal. No respiratory distress.     Breath sounds: Normal breath sounds. No wheezing or rales.  Neurological:     Mental Status: She is alert.        Assessment:     #1 hypertension stable and at goal  #2 GERD controlled with low-dose Nexium  #3 hyperlipidemia  #4 history of recurrent depression stable on low-dose Lexapro    Plan:     -Refilled all medications for 1 year -Obtain follow-up labs with lipids, hepatic, basic metabolic panel -  Routine follow-up in 6 months and sooner as needed  Eulas Post MD [facility] at [facility]

## 2018-12-11 DIAGNOSIS — N3020 Other chronic cystitis without hematuria: Secondary | ICD-10-CM | POA: Diagnosis not present

## 2018-12-11 DIAGNOSIS — R350 Frequency of micturition: Secondary | ICD-10-CM | POA: Diagnosis not present

## 2018-12-24 ENCOUNTER — Other Ambulatory Visit: Payer: Self-pay

## 2018-12-24 NOTE — Patient Outreach (Signed)
[place] [facility] Care Management  12/24/2018  DALY WHIPKEY 10-19-1940 [id number]   Medication Adherence call to Mrs. [name] Telephone call to Patient regarding Medication Adherence unable to reach patient patient voice mail full patient is past due on Simvastatin 40 mg under [place].   [place] Management Direct Dial [phone]  Fax [phone] [email or web address]

## 2019-01-13 ENCOUNTER — Telehealth: Payer: Self-pay | Admitting: Family Medicine

## 2019-01-13 ENCOUNTER — Other Ambulatory Visit: Payer: Self-pay

## 2019-01-13 NOTE — Patient Outreach (Signed)
[place] [facility]) Care Management  01/13/2019  Wendy Sandoval [birth year] [id number]   Medication Adherence call to Wendy Sandoval [facility] spoke with patient she is past due on Simvastatin 40 mg patient explain she is only taking 1/2 tablet daily not 1 tablet daily patient was a bit upset because the pharmacy keeps calling her telling her prescription is ready,call doctor [place] office and left a message for doctor to send in a new prescription stating new instruction.Wendy Sandoval is showing past due under [place].   [place] Management Direct Dial [phone]  Fax [phone] [email or web address]

## 2019-01-13 NOTE — Telephone Encounter (Signed)
simvastatin (ZOCOR) 40 MG tablet     Vicente Males, with [facility], calling on behalf of patient to request instructions for this medication to be looked over. She states that is written as take one tablet daily but patient states Dr. Elease Hashimoto advised her to take 1/2 tablet daily. Vicente Males is requesting a call back. Please advise.

## 2019-01-15 NOTE — Telephone Encounter (Signed)
Her lipids are well controlled by recent labs- so if she has been only taking one half daily- would continue the 20 mg dose of Simvastatin.

## 2019-01-19 NOTE — Telephone Encounter (Signed)
Left detailed message on machine for [facility] with Dr Erick Blinks recommendation.

## 2019-01-22 ENCOUNTER — Other Ambulatory Visit: Payer: Self-pay | Admitting: Family Medicine

## 2019-03-24 ENCOUNTER — Other Ambulatory Visit: Payer: Self-pay

## 2019-03-24 ENCOUNTER — Encounter: Payer: Self-pay | Admitting: Family Medicine

## 2019-03-24 ENCOUNTER — Ambulatory Visit: Payer: Medicare Other | Admitting: Family Medicine

## 2019-03-24 VITALS — BP 118/68 | HR 58 | Temp 97.3°F | Ht 61.25 in | Wt 155.6 lb

## 2019-03-24 DIAGNOSIS — M5416 Radiculopathy, lumbar region: Secondary | ICD-10-CM

## 2019-03-24 MED ORDER — PREDNISONE 10 MG PO TABS: ORAL_TABLET | ORAL | 0 refills | Status: DC

## 2019-03-24 MED ORDER — GABAPENTIN 100 MG PO CAPS: 100.0000 mg | ORAL_CAPSULE | Freq: Two times a day (BID) | ORAL | 3 refills | Status: DC

## 2019-03-24 NOTE — Patient Instructions (Signed)

## 2019-03-24 NOTE — Progress Notes (Signed)
Subjective:     Patient ID: Wendy Sandoval, female   DOB: 11/16/1940, 78 y.o.   MRN: [id number]  HPI [place] is seen with some back pain right lower lumbar area which has been radiating all the way down to the foot and ankle.  Onset about 2 weeks ago.  She describes sharp pain relatively constant.  8 out of 10 in severity.  She has taken Tylenol with minimal relief.  No numbness.  No weakness.  No clear exacerbating factors.  She has some chronic mild urine incontinence but no new incontinence symptoms.  She had MRI of the back back in 2011 with spondylosis L4-L5.  Several years ago had epidural injections for her back.  Denies any fevers or chills.  No weight loss.  No appetite change.  No history of malignancy  Past Medical History:  Diagnosis Date  . Chronic UTI (urinary tract infection)    recurrent  . Depression   . DIVERTICULOSIS, COLON 03/17/2007  . DJD (degenerative joint disease)   . Esophageal stricture   . Fatty liver   . GERD 08/09/2008  . Headache(784.0)   . Hiatal hernia   . HYPERCHOLESTEROLEMIA 07/07/2007  . HYPERTENSION 07/07/2007  . IBS (irritable bowel syndrome)   . Schatzki's ring   . TMJ syndrome    Past Surgical History:  Procedure Laterality Date  . ABDOMINAL HYSTERECTOMY  1998  . APPENDECTOMY    . BREAST EXCISIONAL BIOPSY Left    Benign   . BREAST SURGERY     milk gland  . LUNG SURGERY  1983    reports that she has never smoked. She has never used smokeless tobacco. She reports that she does not drink alcohol or use drugs. family history includes Alcohol abuse in an other family member; Arthritis in her mother and another family member; Diabetes in an other family member; Heart disease in an other family member; Hyperlipidemia in an other family member; Hypertension in an other family member; Stroke in an other family member; Thyroid disease in her father. Allergies  Allergen Reactions  . Codeine Sulfate     REACTION: nausea  . Histamine     Anxious  feeling     Review of Systems  Constitutional: Negative for appetite change, chills, fever and unexpected weight change.  Respiratory: Negative for shortness of breath.   Cardiovascular: Negative for chest pain.  Gastrointestinal: Negative for abdominal pain.  Genitourinary: Negative for dysuria.  Musculoskeletal: Positive for back pain.  Neurological: Negative for weakness and numbness.       Objective:   Physical Exam Constitutional:      Appearance: Normal appearance.  Cardiovascular:     Rate and Rhythm: Normal rate and regular rhythm.  Pulmonary:     Breath sounds: Normal breath sounds.  Musculoskeletal:     Comments: Straight leg raise are negative bilaterally.  No leg edema.  Good distal pulses  Neurological:     Mental Status: She is alert.     Comments: 1+ reflexes knee and ankle bilaterally.  She has full strength with plantarflexion and dorsiflexion bilaterally.  Normal sensory function throughout.        Assessment:     New/recent onset right lumbar radiculitis symptoms with no neuro deficits.  She does not have any red flags such as numbness, weakness, new incontinence symptoms, weight loss, fever, etc.    Plan:     -We recommended heat or ice for symptomatic relief. -Recommend trial of prednisone starting at 40 mg daily  and taper -Discussed low-dose gabapentin 100 mg twice daily and may titrate upwards if necessary.  She is aware of potential sedation with this. -Avoid any heavy lifting or frequent back flexion -Office follow-up in 2 weeks to reassess. -We reviewed red flags of things to watch for in terms of weakness, numbness, incontinence, etc.  Eulas Post MD [facility] at [facility]

## 2019-04-12 ENCOUNTER — Other Ambulatory Visit: Payer: Self-pay

## 2019-04-12 ENCOUNTER — Encounter: Payer: Self-pay | Admitting: Family Medicine

## 2019-04-12 ENCOUNTER — Ambulatory Visit: Payer: Medicare Other | Admitting: Family Medicine

## 2019-04-12 VITALS — BP 122/72 | HR 65 | Temp 97.7°F | Ht 61.25 in | Wt 154.5 lb

## 2019-04-12 DIAGNOSIS — M5416 Radiculopathy, lumbar region: Secondary | ICD-10-CM

## 2019-04-12 NOTE — Patient Instructions (Signed)
Increase the Gabapentin to two twice daily  Let me know in two weeks if no better.  If not better then will need to look at MRI.

## 2019-04-12 NOTE — Progress Notes (Signed)
Subjective:     Patient ID: Wendy Sandoval, female   DOB: 1940-07-31, 78 y.o.   MRN: [id number]  HPI Wendy Sandoval is seen for follow-up regarding her low back pain with right lumbar radiculitis symptoms.  Refer to prior note.  Started prednisone but Wendy Sandoval did not see any improvement with that.  Wendy Sandoval has taken gabapentin 100 mg twice daily.  No adverse side effects.  Wendy Sandoval is sleeping through the night but is still having about 8 out of 10 pain intermittently through the day.  Wendy Sandoval has pain that radiates into the right buttock and down toward the foot.  Occasional tingling sensation right lateral leg but no total numbness.  No weakness.  No urine or stool incontinence.  Previous MRI 2011 showed some lumbar spondylosis L4-L5.  Wendy Sandoval had epidural injection way back then.  Wendy Sandoval has tried multiple things including Tylenol, prednisone, many topicals without improvement.  Wendy Sandoval is also tried some heat.  Past Medical History:  Diagnosis Date  . Chronic UTI (urinary tract infection)    recurrent  . Depression   . DIVERTICULOSIS, COLON 03/17/2007  . DJD (degenerative joint disease)   . Esophageal stricture   . Fatty liver   . GERD 08/09/2008  . Headache(784.0)   . Hiatal hernia   . HYPERCHOLESTEROLEMIA 07/07/2007  . HYPERTENSION 07/07/2007  . IBS (irritable bowel syndrome)   . Schatzki's ring   . TMJ syndrome    Past Surgical History:  Procedure Laterality Date  . ABDOMINAL HYSTERECTOMY  1998  . APPENDECTOMY    . BREAST EXCISIONAL BIOPSY Left    Benign   . BREAST SURGERY     milk gland  . LUNG SURGERY  1983    reports that Wendy Sandoval has never smoked. Wendy Sandoval has never used smokeless tobacco. Wendy Sandoval reports that Wendy Sandoval does not drink alcohol or use drugs. family history includes Alcohol abuse in an other family member; Arthritis in her mother and another family member; Diabetes in an other family member; Heart disease in an other family member; Hyperlipidemia in an other family member; Hypertension in an other family member;  Stroke in an other family member; Thyroid disease in her father. Allergies  Allergen Reactions  . Codeine Sulfate     REACTION: nausea  . Histamine     Anxious feeling     Review of Systems  Constitutional: Negative for chills and fever.  Respiratory: Negative for shortness of breath.   Cardiovascular: Negative for chest pain.  Genitourinary: Negative for dysuria.  Musculoskeletal: Positive for back pain.  Neurological: Negative for weakness.       Objective:   Physical Exam Vitals signs reviewed.  Constitutional:      Appearance: Normal appearance.  Cardiovascular:     Rate and Rhythm: Normal rate and regular rhythm.  Pulmonary:     Effort: Pulmonary effort is normal.     Breath sounds: Normal breath sounds.  Musculoskeletal:     Comments: Straight leg raise are negative bilaterally  Neurological:     Mental Status: Wendy Sandoval is alert.     Comments: Symmetric reflexes ankle and knee bilaterally.  Wendy Sandoval has good strength with plantarflexion, dorsiflexion, and knee extension on the right        Assessment:     Right lumbar radiculitis symptoms.  Unimproved with prednisone.  Nonfocal neuro exam.  Duration now about 1 month    Plan:     -Increase gabapentin to 200 mg twice daily -Wendy Sandoval is looking at possible chiropractic intervention which has  helped her in the past -If not improved in 2 weeks with the above consider MRI to further evaluate -Follow-up sooner for any progressive numbness, weakness, or progressive pain  Eulas Post MD [facility] at [facility]

## 2019-04-28 ENCOUNTER — Telehealth: Payer: Self-pay

## 2019-04-28 NOTE — Telephone Encounter (Signed)
Pt called bc she was advised to call to f/u on how the chiropractor visit was going. Pt called and stated that the chiropractor was helping with her issue. Pt stated she has been 3x.

## 2019-04-28 NOTE — Telephone Encounter (Signed)
Would continue if helping

## 2019-04-28 NOTE — Telephone Encounter (Signed)
Called patient and LMOVM to return call  [place] for [facility] to Discuss results / PCP / recommendations / Schedule patient  Left a detailed message to let patient know that Dr. Elease Hashimoto recommends continuing the chiropractor visits if this is helping.  CRM Created.

## 2019-04-28 NOTE — Telephone Encounter (Signed)
Copied from [place] [id number]. Topic: General - Call Back - No Documentation >> Apr 27, 2019  4:59 PM Erick Blinks wrote: Reason for CRM: Pt called to report that the chiropractor is helping with her back pain, please advise. Pt is requesting a call back from PCP Best contact: [phone]

## 2019-04-28 NOTE — Telephone Encounter (Signed)
Please see message. °

## 2019-06-04 ENCOUNTER — Encounter: Payer: Self-pay | Admitting: Family Medicine

## 2019-06-04 ENCOUNTER — Other Ambulatory Visit: Payer: Self-pay

## 2019-06-04 ENCOUNTER — Ambulatory Visit: Payer: Medicare Other | Admitting: Family Medicine

## 2019-06-04 VITALS — BP 112/68 | HR 57 | Temp 97.3°F | Ht 61.25 in | Wt 155.9 lb

## 2019-06-04 DIAGNOSIS — F339 Major depressive disorder, recurrent, unspecified: Secondary | ICD-10-CM

## 2019-06-04 DIAGNOSIS — E7800 Pure hypercholesterolemia, unspecified: Secondary | ICD-10-CM

## 2019-06-04 DIAGNOSIS — I10 Essential (primary) hypertension: Secondary | ICD-10-CM

## 2019-06-04 NOTE — Patient Instructions (Signed)
Stop the aspirin- as discussed.

## 2019-06-04 NOTE — Progress Notes (Signed)
Subjective:     Patient ID: Wendy Sandoval, female   DOB: 1941/04/10, 79 y.o.   MRN: [id number]  HPI  Wendy Sandoval is seen for medical follow-up.  She has had some ongoing issues with urinary urgency and some stress incontinence.  She has scheduled follow-up with urologist regarding that.  Otherwise doing well.  Her back pain from December has now resolved after chiropractic treatment.  She is very pleased with that.  Her mother passed away at night age 73 a few years ago and she still has some sadness related to that.  She is staying active with church.  Hypertension treated with amlodipine, HCTZ, and lisinopril.  Also takes metoprolol.  No dizziness.  No chest pains.  She is on simvastatin for hyperlipidemia.  Lipids were well controlled when checked last summer.  She apparently is taking aspirin and has no history of CVA or CAD  She has history of recurrent depression stable on low-dose Lexapro  Past Medical History:  Diagnosis Date  . Chronic UTI (urinary tract infection)    recurrent  . Depression   . DIVERTICULOSIS, COLON 03/17/2007  . DJD (degenerative joint disease)   . Esophageal stricture   . Fatty liver   . GERD 08/09/2008  . Headache(784.0)   . Hiatal hernia   . HYPERCHOLESTEROLEMIA 07/07/2007  . HYPERTENSION 07/07/2007  . IBS (irritable bowel syndrome)   . Schatzki's ring   . TMJ syndrome    Past Surgical History:  Procedure Laterality Date  . ABDOMINAL HYSTERECTOMY  1998  . APPENDECTOMY    . BREAST EXCISIONAL BIOPSY Left    Benign   . BREAST SURGERY     milk gland  . LUNG SURGERY  1983    reports that she has never smoked. She has never used smokeless tobacco. She reports that she does not drink alcohol or use drugs. family history includes Alcohol abuse in an other family member; Arthritis in her mother and another family member; Diabetes in an other family member; Heart disease in an other family member; Hyperlipidemia in an other family member; Hypertension in  an other family member; Stroke in an other family member; Thyroid disease in her father. Allergies  Allergen Reactions  . Codeine Sulfate     REACTION: nausea  . Histamine     Anxious feeling    Review of Systems  Constitutional: Negative for fatigue.  Eyes: Negative for visual disturbance.  Respiratory: Negative for cough, chest tightness, shortness of breath and wheezing.   Cardiovascular: Negative for chest pain, palpitations and leg swelling.  Neurological: Negative for dizziness, seizures, syncope, weakness, light-headedness and headaches.       Objective:   Physical Exam Constitutional:      Appearance: She is well-developed.  Eyes:     Pupils: Pupils are equal, round, and reactive to light.  Neck:     Thyroid: No thyromegaly.     Vascular: No JVD.  Cardiovascular:     Rate and Rhythm: Normal rate and regular rhythm.     Heart sounds: No gallop.   Pulmonary:     Effort: Pulmonary effort is normal. No respiratory distress.     Breath sounds: Normal breath sounds. No wheezing or rales.  Musculoskeletal:     Cervical back: Neck supple.  Neurological:     Mental Status: She is alert.        Assessment:     #1 hypertension stable and at goal  #2 hyperlipidemia treated with simvastatin and lipids were  at goal when checked last summer  #3 history of recurrent depression stable on Lexapro    Plan:     -Advised to discontinue aspirin use.  -She had questions about Covid vaccine.  She is encouraged to pursue getting this set up  -Continue current medications.  We will plan routine follow-up in 6 months and recheck labs then  Wendy Post MD [facility] at [facility]

## 2019-08-18 ENCOUNTER — Telehealth: Payer: Self-pay | Admitting: Family Medicine

## 2019-08-18 NOTE — Telephone Encounter (Signed)
Wendy Sandoval states that the px for simvastatin shows one tablet daily but she sees that her PCP wants her to take 1/2 tablet. Pt needs clarity on her medication for simvastatin.    Please call pt to inform her

## 2019-08-18 NOTE — Telephone Encounter (Signed)
Spoke with pt everything is good

## 2019-09-22 ENCOUNTER — Encounter: Payer: Self-pay | Admitting: Family Medicine

## 2019-09-22 ENCOUNTER — Other Ambulatory Visit: Payer: Self-pay

## 2019-09-22 ENCOUNTER — Ambulatory Visit (INDEPENDENT_AMBULATORY_CARE_PROVIDER_SITE_OTHER): Payer: Medicare Other | Admitting: Family Medicine

## 2019-09-22 VITALS — BP 114/64 | HR 65 | Temp 97.6°F | Ht 60.5 in | Wt 157.3 lb

## 2019-09-22 DIAGNOSIS — I10 Essential (primary) hypertension: Secondary | ICD-10-CM | POA: Diagnosis not present

## 2019-09-22 DIAGNOSIS — E7800 Pure hypercholesterolemia, unspecified: Secondary | ICD-10-CM | POA: Diagnosis not present

## 2019-09-22 DIAGNOSIS — R05 Cough: Secondary | ICD-10-CM | POA: Diagnosis not present

## 2019-09-22 DIAGNOSIS — Z0000 Encounter for general adult medical examination without abnormal findings: Secondary | ICD-10-CM | POA: Diagnosis not present

## 2019-09-22 DIAGNOSIS — R053 Chronic cough: Secondary | ICD-10-CM

## 2019-09-22 LAB — LIPID PANEL
Cholesterol: 140 mg/dL (ref 0–200)
HDL: 44.4 mg/dL (ref >39.00)
LDL Cholesterol: 75 mg/dL (ref 0–99)
NonHDL: 95.15
Total CHOL/HDL Ratio: 3
Triglycerides: 102 mg/dL (ref 0.0–149.0)
VLDL: 20.4 mg/dL (ref 0.0–40.0)

## 2019-09-22 LAB — BASIC METABOLIC PANEL
BUN: 20 mg/dL (ref 6–23)
CO2: 27 mEq/L (ref 19–32)
Calcium: 9.4 mg/dL (ref 8.4–10.5)
Chloride: 104 mEq/L (ref 96–112)
Creatinine, Ser: 0.69 mg/dL (ref 0.40–1.20)
GFR: 81.78 mL/min (ref >60.00)
Glucose, Bld: 93 mg/dL (ref 70–99)
Potassium: 3.7 mEq/L (ref 3.5–5.1)
Sodium: 139 mEq/L (ref 135–145)

## 2019-09-22 LAB — HEPATIC FUNCTION PANEL
ALT: 11 U/L (ref 0–35)
AST: 17 U/L (ref 0–37)
Albumin: 4.1 g/dL (ref 3.5–5.2)
Alkaline Phosphatase: 61 U/L (ref 39–117)
Bilirubin, Direct: 0.1 mg/dL (ref 0.0–0.3)
Total Bilirubin: 0.7 mg/dL (ref 0.2–1.2)
Total Protein: 6.8 g/dL (ref 6.0–8.3)

## 2019-09-22 MED ORDER — LOSARTAN POTASSIUM 50 MG PO TABS: 50.0000 mg | ORAL_TABLET | Freq: Every day | ORAL | 3 refills | Status: DC

## 2019-09-22 NOTE — Progress Notes (Signed)
Subjective:     Patient ID: Wendy Sandoval, female   DOB: 08-Feb-1941, 79 y.o.   MRN: [id number]  HPI   Wendy Sandoval is here for medical follow-up and for Medicare subsequent annual wellness visit.  She has hypertension and has been on amlodipine, HCTZ, and lisinopril for several years.  She has had some dry cough and frequent clearing of throat for the past year.  No hemoptysis.  No appetite or weight changes.  She wonders if the lisinopril may be related to her cough.  She does have occasional postnasal drip symptoms.  No active reflux symptoms.  She is on low-dose chronic PPI.  Never smoked.    Hyperlipidemia treated with simvastatin.  She would like to get follow-up labs today.  Compliant with therapy.  She has history of urine urgency.  Was placed on Myrbetriq 50 mg daily per her urologist but still has some breakthrough symptoms.  no recent burning with urination.  She has completed her Covid vaccinations  Past Medical History:  Diagnosis Date  . Chronic UTI (urinary tract infection)    recurrent  . Depression   . DIVERTICULOSIS, COLON 03/17/2007  . DJD (degenerative joint disease)   . Esophageal stricture   . Fatty liver   . GERD 08/09/2008  . Headache(784.0)   . Hiatal hernia   . HYPERCHOLESTEROLEMIA 07/07/2007  . HYPERTENSION 07/07/2007  . IBS (irritable bowel syndrome)   . Schatzki's ring   . TMJ syndrome    Past Surgical History:  Procedure Laterality Date  . ABDOMINAL HYSTERECTOMY  1998  . APPENDECTOMY    . BREAST EXCISIONAL BIOPSY Left    Benign   . BREAST SURGERY     milk gland  . LUNG SURGERY  1983    reports that she has never smoked. She has never used smokeless tobacco. She reports that she does not drink alcohol or use drugs. family history includes Alcohol abuse in an other family member; Arthritis in her mother and another family member; Diabetes in an other family member; Heart disease in an other family member; Hyperlipidemia in an other family member;  Hypertension in an other family member; Stroke in an other family member; Thyroid disease in her father. Allergies  Allergen Reactions  . Codeine Sulfate     REACTION: nausea  . Histamine     Anxious feeling   1.  Risk factors based on Past Medical , Social, and Family history reviewed and as indicated above with no changes  2.  Limitations in physical activities None.  No recent falls.  No formal exercise.    3.  Depression/mood No active depression or anxiety issues. PHQ-2=0  4.  Hearing No deficits  5.  ADLs independent in all.  6.  Cognitive function (orientation to time and place, language, writing, speech,memory) no short or long term memory issues.  Language and judgement intact.  7.  Home Safety no issues  8.  Height, weight, and visual acuity.all stable. Wt Readings from Last 3 Encounters:  09/22/19 157 lb 4.8 oz (71.4 kg)  06/04/19 155 lb 14.4 oz (70.7 kg)  04/12/19 154 lb 8 oz (70.1 kg)    9.  Counseling discussed   Counseled regarding age and gender appropriate preventative screenings and immunizations.   10. Recommendation of preventive services.  Needs tetanus but she is aware insurance may not cover in absence of injury.  Discussed DEXA and she declines.  11. Labs based on risk factors- lipid, hepatic, BMP  12. Care Plan-  as below.  13. Other Providers  Dr MacDiarmid-Urology.  14. Written schedule of screening/prevention services given to patient.   Review of Systems  Constitutional: Negative for appetite change, fatigue and unexpected weight change.  Eyes: Negative for visual disturbance.  Respiratory: Negative for cough, chest tightness, shortness of breath and wheezing.   Cardiovascular: Negative for chest pain, palpitations and leg swelling.  Gastrointestinal: Negative for abdominal pain.  Endocrine: Negative for polydipsia and polyuria.  Genitourinary: Positive for urgency. Negative for dysuria.  Neurological: Negative for dizziness, seizures,  syncope, weakness, light-headedness and headaches.       Objective:   Physical Exam Constitutional:      Appearance: She is well-developed.  Eyes:     Pupils: Pupils are equal, round, and reactive to light.  Neck:     Thyroid: No thyromegaly.     Vascular: No JVD.  Cardiovascular:     Rate and Rhythm: Normal rate and regular rhythm.     Heart sounds: No gallop.   Pulmonary:     Effort: Pulmonary effort is normal. No respiratory distress.     Breath sounds: Normal breath sounds. No wheezing or rales.  Musculoskeletal:     Cervical back: Neck supple.  Neurological:     Mental Status: She is alert.        Assessment:     #1 Medicare subsequent annual wellness visit.  Discussed health maintenance issues as below  #2 hypertension stable and at goal  #3 dry cough possibly related to ACE inhibitor.  Other differential would include silent GERD versus allergic postnasal drip symptoms.  #4 hyperlipidemia treated with simvastatin    Plan:     -Stop lisinopril and start losartan 50 mg daily and monitor blood pressure closely -Consider daily use of Flonase for postnasal drip -Touch base if cough not improving over the next couple of weeks -Check labs with basic metabolic panel, hepatic panel, lipid panel -Continue annual flu vaccine -Covid vaccine already given as above  Eulas Post MD [facility] at [facility]

## 2019-09-22 NOTE — Patient Instructions (Signed)
Preventive Care 38 Years and Older, Female Preventive care refers to lifestyle choices and visits with your health care provider that can promote health and wellness. This includes:  A yearly physical exam. This is also called an annual well check.  Regular dental and eye exams.  Immunizations.  Screening for certain conditions.  Healthy lifestyle choices, such as diet and exercise. What can I expect for my preventive care visit? Physical exam Your health care provider will check:  Height and weight. These may be used to calculate body mass index (BMI), which is a measurement that tells if you are at a healthy weight.  Heart rate and blood pressure.  Your skin for abnormal spots. Counseling Your health care provider may ask you questions about:  Alcohol, tobacco, and drug use.  Emotional well-being.  Home and relationship well-being.  Sexual activity.  Eating habits.  History of falls.  Memory and ability to understand (cognition).  Work and work Statistician.  Pregnancy and menstrual history. What immunizations do I need?  Influenza (flu) vaccine  This is recommended every year. Tetanus, diphtheria, and pertussis (Tdap) vaccine  You may need a Td booster every 10 years. Varicella (chickenpox) vaccine  You may need this vaccine if you have not already been vaccinated. Zoster (shingles) vaccine  You may need this after age 33. Pneumococcal conjugate (PCV13) vaccine  One dose is recommended after age 33. Pneumococcal polysaccharide (PPSV23) vaccine  One dose is recommended after age 72. Measles, mumps, and rubella (MMR) vaccine  You may need at least one dose of MMR if you were born in 1957 or later. You may also need a second dose. Meningococcal conjugate (MenACWY) vaccine  You may need this if you have certain conditions. Hepatitis A vaccine  You may need this if you have certain conditions or if you travel or work in places where you may be exposed  to hepatitis A. Hepatitis B vaccine  You may need this if you have certain conditions or if you travel or work in places where you may be exposed to hepatitis B. Haemophilus influenzae type b (Hib) vaccine  You may need this if you have certain conditions. You may receive vaccines as individual doses or as more than one vaccine together in one shot (combination vaccines). Talk with your health care provider about the risks and benefits of combination vaccines. What tests do I need? Blood tests  Lipid and cholesterol levels. These may be checked every 5 years, or more frequently depending on your overall health.  Hepatitis C test.  Hepatitis B test. Screening  Lung cancer screening. You may have this screening every year starting at age 39 if you have a 30-pack-year history of smoking and currently smoke or have quit within the past 15 years.  Colorectal cancer screening. All adults should have this screening starting at age 36 and continuing until age 15. Your health care provider may recommend screening at age 23 if you are at increased risk. You will have tests every 1-10 years, depending on your results and the type of screening test.  Diabetes screening. This is done by checking your blood sugar (glucose) after you have not eaten for a while (fasting). You may have this done every 1-3 years.  Mammogram. This may be done every 1-2 years. Talk with your health care provider about how often you should have regular mammograms.  BRCA-related cancer screening. This may be done if you have a family history of breast, ovarian, tubal, or peritoneal cancers.  Other tests  Sexually transmitted disease (STD) testing.  Bone density scan. This is done to screen for osteoporosis. You may have this done starting at age 58. Follow these instructions at home: Eating and drinking  Eat a diet that includes fresh fruits and vegetables, whole grains, lean protein, and low-fat dairy products. Limit  your intake of foods with high amounts of sugar, saturated fats, and salt.  Take vitamin and mineral supplements as recommended by your health care provider.  Do not drink alcohol if your health care provider tells you not to drink.  If you drink alcohol: ? Limit how much you have to 0-1 drink a day. ? Be aware of how much alcohol is in your drink. In the [place] one drink equals one 12 oz bottle of beer (355 mL), one 5 oz glass of wine (148 mL), or one 1 oz glass of hard liquor (44 mL). Lifestyle  Take daily care of your teeth and gums.  Stay active. Exercise for at least 30 minutes on 5 or more days each week.  Do not use any products that contain nicotine or tobacco, such as cigarettes, e-cigarettes, and chewing tobacco. If you need help quitting, ask your health care provider.  If you are sexually active, practice safe sex. Use a condom or other form of protection in order to prevent STIs (sexually transmitted infections).  Talk with your health care provider about taking a low-dose aspirin or statin. What's next?  Go to your health care provider once a year for a well check visit.  Ask your health care provider how often you should have your eyes and teeth checked.  Stay up to date on all vaccines. This information is not intended to replace advice given to you by your health care provider. Make sure you discuss any questions you have with your health care provider. Document Revised: 04/16/2018 Document Reviewed: 04/16/2018 Elsevier Patient Education  2020 [employer].

## 2019-09-28 ENCOUNTER — Telehealth: Payer: Self-pay | Admitting: Family Medicine

## 2019-09-28 NOTE — Telephone Encounter (Signed)
Patient is wanting for the nurse to call her to give her the results for her liver functions and her cholesterol.  Please advise

## 2019-09-28 NOTE — Telephone Encounter (Signed)
Lab results mailed out as discussed with pt

## 2019-10-11 ENCOUNTER — Other Ambulatory Visit: Payer: Self-pay | Admitting: Family Medicine

## 2019-10-11 DIAGNOSIS — Z1231 Encounter for screening mammogram for malignant neoplasm of breast: Secondary | ICD-10-CM

## 2019-11-10 ENCOUNTER — Encounter: Payer: Self-pay | Admitting: *Deleted

## 2019-11-17 ENCOUNTER — Other Ambulatory Visit: Payer: Self-pay

## 2019-11-17 ENCOUNTER — Ambulatory Visit
Admission: RE | Admit: 2019-11-17 | Discharge: 2019-11-17 | Disposition: A | Discharge status: Home / Self Care | Payer: Medicare Other | Source: Ambulatory Visit | Attending: Family Medicine | Admitting: Family Medicine

## 2019-11-17 DIAGNOSIS — Z1231 Encounter for screening mammogram for malignant neoplasm of breast: Secondary | ICD-10-CM

## 2019-11-18 ENCOUNTER — Ambulatory Visit: Payer: Medicare Other | Admitting: Physician Assistant

## 2019-11-18 ENCOUNTER — Encounter: Payer: Self-pay | Admitting: Physician Assistant

## 2019-11-18 DIAGNOSIS — Z1283 Encounter for screening for malignant neoplasm of skin: Secondary | ICD-10-CM | POA: Diagnosis not present

## 2019-11-18 DIAGNOSIS — L821 Other seborrheic keratosis: Secondary | ICD-10-CM

## 2019-11-18 NOTE — Progress Notes (Signed)
   Follow-Up Visit   Subjective  Wendy Sandoval is a 79 y.o. female who presents for the following: Skin Problem (Patient here today for spot on right upper arm x years patient has noticed color change.  Patient would also like to have her back checked.).   The following portions of the chart were reviewed this encounter and updated as appropriate:     Objective  Well appearing patient in no apparent distress; mood and affect are within normal limits.  All skin waist up examined.  Objective  Right Hand - Posterior, Right Upper Arm - Anterior: Stuck-on, waxy papules and plaques.   Objective  waist up: No atypical nevi No signs of non-mole skin cancer.    Assessment & Plan  Seborrheic keratosis (2) Right Hand - Posterior; Right Upper Arm - Anterior  observe  Screening exam for skin cancer waist up  Yearly skin exams    I, Kjerstin Abrigo, PA-C, have reviewed all documentation's for this visit.  The documentation on 11/18/19 for the exam, diagnosis, procedures and orders are all accurate and complete.

## 2019-11-28 ENCOUNTER — Other Ambulatory Visit: Payer: Self-pay | Admitting: Family Medicine

## 2019-12-11 ENCOUNTER — Other Ambulatory Visit: Payer: Self-pay | Admitting: Family Medicine

## 2019-12-23 ENCOUNTER — Other Ambulatory Visit: Payer: Self-pay | Admitting: Family Medicine

## 2020-01-15 ENCOUNTER — Other Ambulatory Visit: Payer: Self-pay | Admitting: Family Medicine

## 2020-01-27 ENCOUNTER — Other Ambulatory Visit: Payer: Self-pay | Admitting: Family Medicine

## 2020-07-10 ENCOUNTER — Other Ambulatory Visit: Payer: Self-pay | Admitting: Family Medicine

## 2020-08-02 ENCOUNTER — Other Ambulatory Visit: Payer: Self-pay

## 2020-08-02 ENCOUNTER — Telehealth (INDEPENDENT_AMBULATORY_CARE_PROVIDER_SITE_OTHER): Payer: Medicare Other | Admitting: Family Medicine

## 2020-08-02 DIAGNOSIS — R509 Fever, unspecified: Secondary | ICD-10-CM | POA: Diagnosis not present

## 2020-08-02 DIAGNOSIS — R059 Cough, unspecified: Secondary | ICD-10-CM | POA: Diagnosis not present

## 2020-08-02 DIAGNOSIS — R067 Sneezing: Secondary | ICD-10-CM | POA: Diagnosis not present

## 2020-08-02 NOTE — Progress Notes (Signed)
Patient ID: Wendy Sandoval, female   DOB: [birth year], 80 y.o.   MRN: [id number]   This visit type was conducted due to national recommendations for restrictions regarding the COVID-19 pandemic in an effort to limit this patient's exposure and mitigate transmission in our community.   Virtual Visit via Telephone Note  I connected with Wendy Sandoval on 08/02/20 at  2:00 PM EDT by telephone and verified that I am speaking with the correct person using two identifiers.   I discussed the limitations, risks, security and privacy concerns of performing an evaluation and management service by telephone and the availability of in person appointments. I also discussed with the patient that there may be a patient responsible charge related to this service. The patient expressed understanding and agreed to proceed.  Location patient: home Location provider: work or home office Participants present for the call: patient, provider Patient did not have a visit in the prior 7 days to address this/these issue(s).   History of Present Illness:  Wendy Sandoval called with onset last Friday of some nonspecific symptoms of malaise and some low-grade nausea without vomiting.  She subsequently developed some mild coughing and sneezing.  She had just gotten back from a funeral for her sister-in-law.  She was not aware of any sick contacts.  She states she developed temperature of 100.4 which was intermittent for a few days but none since yesterday.  No dysuria.  Coughing is minimal.  Feels better overall.  She noticed a small sore on her hard palate area which sounds like a aphthous ulcer.  No diffuse erythema.  Swallowing okay.  No diarrhea.  He denies any abdominal pain at this time.  She has had COVID vaccines.  Has not had Covid testing since onset of the symptoms.  Past Medical History:  Diagnosis Date  . Basal cell carcinoma 04/23/2011   upper left cheek (txpbx)  . Chronic UTI (urinary tract infection)     recurrent  . Depression   . DIVERTICULOSIS, COLON 03/17/2007  . DJD (degenerative joint disease)   . Esophageal stricture   . Fatty liver   . GERD 08/09/2008  . Headache(784.0)   . Hiatal hernia   . HYPERCHOLESTEROLEMIA 07/07/2007  . HYPERTENSION 07/07/2007  . IBS (irritable bowel syndrome)   . SCC (squamous cell carcinoma) 08/18/2014   right forearm (txpbx) cautery   . Schatzki's ring   . Squamous cell carcinoma of skin 11/29/2010   left inner shin (cx5fu) KA  . TMJ syndrome    Past Surgical History:  Procedure Laterality Date  . ABDOMINAL HYSTERECTOMY  1998  . APPENDECTOMY    . BREAST EXCISIONAL BIOPSY Left    Benign   . BREAST SURGERY     milk gland  . LUNG SURGERY  1983    reports that she has Sandoval smoked. She has Sandoval used smokeless tobacco. She reports that she does not drink alcohol and does not use drugs. family history includes Alcohol abuse in an other family member; Arthritis in her mother and another family member; Diabetes in an other family member; Heart disease in an other family member; Hyperlipidemia in an other family member; Hypertension in an other family member; Stroke in an other family member; Thyroid disease in her father. Allergies  Allergen Reactions  . Codeine Sulfate     REACTION: nausea  . Histamine     Anxious feeling      Observations/Objective: Patient sounds cheerful and well on the phone. I do not appreciate  any SOB. Speech and thought processing are grossly intact. Patient reported vitals:  Assessment and Plan:  Probable viral syndrome.  Patient described low-grade fever, nausea without vomiting, upper respiratory symptoms with mild cough and sneezing and all these seem to be improving.  She has completed Covid vaccines.  -We discussed Covid testing but at this point she seems to be improved -Continue plenty of fluids and rest -She has what sounds like aphthous ulcer roof of mouth.  Continue salt water rinses for that -Follow-up for  any recurrent fever or other concerns  Follow Up Instructions:  -Follow-up as needed as above   [id number] [id number] [id number] 11-20 [id number] 21-30 I did not refer this patient for an OV in the next 24 hours for this/these issue(s).  I discussed the assessment and treatment plan with the patient. The patient was provided an opportunity to ask questions and all were answered. The patient agreed with the plan and demonstrated an understanding of the instructions.   The patient was advised to call back or seek an in-person evaluation if the symptoms worsen or if the condition fails to improve as anticipated.  I provided 18 minutes of non-face-to-face time during this encounter.   Carolann Littler, MD

## 2020-08-18 ENCOUNTER — Other Ambulatory Visit: Payer: Self-pay | Admitting: Family Medicine

## 2020-08-23 ENCOUNTER — Other Ambulatory Visit: Payer: Self-pay | Admitting: Family Medicine

## 2020-08-23 DIAGNOSIS — Z1231 Encounter for screening mammogram for malignant neoplasm of breast: Secondary | ICD-10-CM

## 2020-09-12 ENCOUNTER — Telehealth: Payer: Self-pay | Admitting: Family Medicine

## 2020-09-12 NOTE — Telephone Encounter (Signed)
Left message for patient to call back and schedule Medicare Annual Wellness Visit (AWV) either virtually or in office.   Last AWV 09/22/19  please schedule at anytime with [facility] Nurse Health Advisor 1 or 2   This should be a 45 minute visit. 

## 2020-10-15 ENCOUNTER — Other Ambulatory Visit: Payer: Self-pay | Admitting: Family Medicine

## 2020-10-25 ENCOUNTER — Ambulatory Visit: Payer: Medicare Other

## 2020-10-27 ENCOUNTER — Other Ambulatory Visit: Payer: Self-pay

## 2020-10-27 ENCOUNTER — Ambulatory Visit (INDEPENDENT_AMBULATORY_CARE_PROVIDER_SITE_OTHER): Payer: Medicare Other

## 2020-10-27 VITALS — BP 122/64 | HR 65 | Temp 98.2°F | Ht 63.0 in | Wt 156.0 lb

## 2020-10-27 DIAGNOSIS — Z0000 Encounter for general adult medical examination without abnormal findings: Secondary | ICD-10-CM | POA: Diagnosis not present

## 2020-10-27 NOTE — Patient Instructions (Signed)
Wendy Sandoval , Thank you for taking time to come for your Medicare Wellness Visit. I appreciate your ongoing commitment to your health goals. Please review the following plan we discussed and let me know if I can assist you in the future.   Screening recommendations/referrals: Colonoscopy: no longer required Mammogram: scheduled 11/28/2020 Bone Density: patient declined  Recommended yearly ophthalmology/optometry visit for glaucoma screening and checkup Recommended yearly dental visit for hygiene and checkup  Vaccinations: Influenza vaccine: due in fall 2022 Pneumococcal vaccine: completed series  Tdap vaccine: due upon injury  Shingles vaccine: will obtain local pharmacy   Advanced directives: will provide copies   Conditions/risks identified: none   Next appointment: none    Preventive Care 31 Years and Older, Female Preventive care refers to lifestyle choices and visits with your health care provider that can promote health and wellness. What does preventive care include? A yearly physical exam. This is also called an annual well check. Dental exams once or twice a year. Routine eye exams. Ask your health care provider how often you should have your eyes checked. Personal lifestyle choices, including: Daily care of your teeth and gums. Regular physical activity. Eating a healthy diet. Avoiding tobacco and drug use. Limiting alcohol use. Practicing safe sex. Taking low-dose aspirin every day. Taking vitamin and mineral supplements as recommended by your health care provider. What happens during an annual well check? The services and screenings done by your health care provider during your annual well check will depend on your age, overall health, lifestyle risk factors, and family history of disease. Counseling  Your health care provider may ask you questions about your: Alcohol use. Tobacco use. Drug use. Emotional well-being. Home and relationship well-being. Sexual  activity. Eating habits. History of falls. Memory and ability to understand (cognition). Work and work Statistician. Reproductive health. Screening  You may have the following tests or measurements: Height, weight, and BMI. Blood pressure. Lipid and cholesterol levels. These may be checked every 5 years, or more frequently if you are over 49 years old. Skin check. Lung cancer screening. You may have this screening every year starting at age 67 if you have a 30-pack-year history of smoking and currently smoke or have quit within the past 15 years. Fecal occult blood test (FOBT) of the stool. You may have this test every year starting at age 21. Flexible sigmoidoscopy or colonoscopy. You may have a sigmoidoscopy every 5 years or a colonoscopy every 10 years starting at age 55. Hepatitis C blood test. Hepatitis B blood test. Sexually transmitted disease (STD) testing. Diabetes screening. This is done by checking your blood sugar (glucose) after you have not eaten for a while (fasting). You may have this done every 1-3 years. Bone density scan. This is done to screen for osteoporosis. You may have this done starting at age 70. Mammogram. This may be done every 1-2 years. Talk to your health care provider about how often you should have regular mammograms. Talk with your health care provider about your test results, treatment options, and if necessary, the need for more tests. Vaccines  Your health care provider may recommend certain vaccines, such as: Influenza vaccine. This is recommended every year. Tetanus, diphtheria, and acellular pertussis (Tdap, Td) vaccine. You may need a Td booster every 10 years. Zoster vaccine. You may need this after age 39. Pneumococcal 13-valent conjugate (PCV13) vaccine. One dose is recommended after age 35. Pneumococcal polysaccharide (PPSV23) vaccine. One dose is recommended after age 4. Talk to your  health care provider about which screenings and vaccines  you need and how often you need them. This information is not intended to replace advice given to you by your health care provider. Make sure you discuss any questions you have with your health care provider. Document Released: 05/19/2015 Document Revised: 01/10/2016 Document Reviewed: 02/21/2015 Elsevier Interactive Patient Education  2017 [place] Prevention in the Home Falls can cause injuries. They can happen to people of all ages. There are many things you can do to make your home safe and to help prevent falls. What can I do on the outside of my home? Regularly fix the edges of walkways and driveways and fix any cracks. Remove anything that might make you trip as you walk through a door, such as a raised step or threshold. Trim any bushes or trees on the path to your home. Use bright outdoor lighting. Clear any walking paths of anything that might make someone trip, such as rocks or tools. Regularly check to see if handrails are loose or broken. Make sure that both sides of any steps have handrails. Any raised decks and porches should have guardrails on the edges. Have any leaves, snow, or ice cleared regularly. Use sand or salt on walking paths during winter. Clean up any spills in your garage right away. This includes oil or grease spills. What can I do in the bathroom? Use night lights. Install grab bars by the toilet and in the tub and shower. Do not use towel bars as grab bars. Use non-skid mats or decals in the tub or shower. If you need to sit down in the shower, use a plastic, non-slip stool. Keep the floor dry. Clean up any water that spills on the floor as soon as it happens. Remove soap buildup in the tub or shower regularly. Attach bath mats securely with double-sided non-slip rug tape. Do not have throw rugs and other things on the floor that can make you trip. What can I do in the bedroom? Use night lights. Make sure that you have a light by your bed that  is easy to reach. Do not use any sheets or blankets that are too big for your bed. They should not hang down onto the floor. Have a firm chair that has side arms. You can use this for support while you get dressed. Do not have throw rugs and other things on the floor that can make you trip. What can I do in the kitchen? Clean up any spills right away. Avoid walking on wet floors. Keep items that you use a lot in easy-to-reach places. If you need to reach something above you, use a strong step stool that has a grab bar. Keep electrical cords out of the way. Do not use floor polish or wax that makes floors slippery. If you must use wax, use non-skid floor wax. Do not have throw rugs and other things on the floor that can make you trip. What can I do with my stairs? Do not leave any items on the stairs. Make sure that there are handrails on both sides of the stairs and use them. Fix handrails that are broken or loose. Make sure that handrails are as long as the stairways. Check any carpeting to make sure that it is firmly attached to the stairs. Fix any carpet that is loose or worn. Avoid having throw rugs at the top or bottom of the stairs. If you do have throw rugs, attach them  to the floor with carpet tape. Make sure that you have a light switch at the top of the stairs and the bottom of the stairs. If you do not have them, ask someone to add them for you. What else can I do to help prevent falls? Wear shoes that: Do not have high heels. Have rubber bottoms. Are comfortable and fit you well. Are closed at the toe. Do not wear sandals. If you use a stepladder: Make sure that it is fully opened. Do not climb a closed stepladder. Make sure that both sides of the stepladder are locked into place. Ask someone to hold it for you, if possible. Clearly mark and make sure that you can see: Any grab bars or handrails. First and last steps. Where the edge of each step is. Use tools that help you  move around (mobility aids) if they are needed. These include: Canes. Walkers. Scooters. Crutches. Turn on the lights when you go into a dark area. Replace any light bulbs as soon as they burn out. Set up your furniture so you have a clear path. Avoid moving your furniture around. If any of your floors are uneven, fix them. If there are any pets around you, be aware of where they are. Review your medicines with your doctor. Some medicines can make you feel dizzy. This can increase your chance of falling. Ask your doctor what other things that you can do to help prevent falls. This information is not intended to replace advice given to you by your health care provider. Make sure you discuss any questions you have with your health care provider. Document Released: 02/16/2009 Document Revised: 09/28/2015 Document Reviewed: 05/27/2014 Elsevier Interactive Patient Education  2017 [employer].

## 2020-10-27 NOTE — Progress Notes (Signed)
Subjective:   Wendy Sandoval is a 80 y.o. female who presents for Medicare Annual (Subsequent) preventive examination.  Review of Systems    N/a       Objective:    There were no vitals filed for this visit. There is no height or weight on file to calculate BMI.  Advanced Directives 08/27/2017 10/10/2016 12/22/2015 01/22/2015  Does Patient Have a Medical Advance Directive? Yes No Yes Yes  Type of Advance Directive - - - [place] in Chart? - - No - copy requested -    Current Medications (verified) Outpatient Encounter Medications as of 10/27/2020  Medication Sig   amLODipine (NORVASC) 5 MG tablet TAKE 1 TABLET BY MOUTH EVERY DAY   cephALEXin (KEFLEX) 250 MG capsule Take 250 mg by mouth daily. Given by urology   cholecalciferol (VITAMIN D) 1000 UNITS tablet Take 1,000 Units by mouth 2 (two) times daily.    escitalopram (LEXAPRO) 10 MG tablet TAKE 1/2 TABLET BY MOUTH DAILY   esomeprazole (NEXIUM) 20 MG capsule Take 20 mg by mouth daily at 12 noon.   hydrochlorothiazide (HYDRODIURIL) 25 MG tablet TAKE 1 TABLET BY MOUTH EVERY DAY   losartan (COZAAR) 50 MG tablet TAKE 1 TABLET BY MOUTH EVERY DAY   metoprolol tartrate (LOPRESSOR) 50 MG tablet TAKE 1/2 TABLET (25 MG TOTAL) BY MOUTH 2 (TWO) TIMES DAILY.   mirabegron ER (MYRBETRIQ) 50 MG TB24 tablet Take 50 mg by mouth daily.   simvastatin (ZOCOR) 40 MG tablet TAKE 1 TABLET BY MOUTH EVERYDAY AT BEDTIME   No facility-administered encounter medications on file as of 10/27/2020.    Allergies (verified) Codeine sulfate and Histamine   History: Past Medical History:  Diagnosis Date   Basal cell carcinoma 04/23/2011   upper left cheek (txpbx)   Chronic UTI (urinary tract infection)    recurrent   Depression    DIVERTICULOSIS, COLON 03/17/2007   DJD (degenerative joint disease)    Esophageal stricture    Fatty liver    GERD 08/09/2008   Headache(784.0)    Hiatal hernia     HYPERCHOLESTEROLEMIA 07/07/2007   HYPERTENSION 07/07/2007   IBS (irritable bowel syndrome)    SCC (squamous cell carcinoma) 08/18/2014   right forearm (txpbx) cautery    Schatzki's ring    Squamous cell carcinoma of skin 11/29/2010   left inner shin (cx52fu) KA   TMJ syndrome    Past Surgical History:  Procedure Laterality Date   ABDOMINAL HYSTERECTOMY  1998   APPENDECTOMY     BREAST EXCISIONAL BIOPSY Left    Benign    BREAST SURGERY     milk gland   LUNG SURGERY  1983   Family History  Problem Relation Age of Onset   Arthritis Other    Hyperlipidemia Other    Hypertension Other    Alcohol abuse Other    Diabetes Other    Stroke Other    Heart disease Other    Arthritis Mother    Thyroid disease Father    Breast cancer Neg Hx    Stomach cancer Neg Hx    Colon cancer Neg Hx    Esophageal cancer Neg Hx    Social History   Socioeconomic History   Marital status: Widowed    Spouse name: Not on file   Number of children: Not on file   Years of education: Not on file   Highest education level: Not on file  Occupational  History   Not on file  Tobacco Use   Smoking status: Never   Smokeless tobacco: Never  Vaping Use   Vaping Use: Never used  Substance and Sexual Activity   Alcohol use: No   Drug use: No   Sexual activity: Not on file  Other Topics Concern   Not on file  Social History Narrative   Not on file   Social Determinants of Health   Financial Resource Strain: Not on file  Food Insecurity: Not on file  Transportation Needs: Not on file  Physical Activity: Not on file  Stress: Not on file  Social Connections: Not on file    Tobacco Counseling Counseling given: Not Answered   Clinical Intake:                 Diabetic?no         Activities of Daily Living No flowsheet data found.  Patient Care Team: Eulas Post, MD as PCP - [facility] [place], Ronalee Red, PA-C as Physician Assistant (Dermatology)  Indicate any recent  Medical Services you may have received from other than [facility] providers in the past year (date may be approximate).     Assessment:   This is a routine wellness examination for [place].  Hearing/Vision screen No results found.  Dietary issues and exercise activities discussed:     Goals Addressed   None    Depression Screen PHQ 2/9 Scores 09/22/2019 08/27/2017 02/10/2017 12/22/2015 11/01/2014 10/14/2013 04/20/2013  PHQ - 2 Score 0 0 0 0 0 0 0    Fall Risk Fall Risk  09/22/2019 08/27/2017 02/10/2017 12/22/2015 11/01/2014  Falls in the past year? 0 No Yes No No  Number falls in past yr: 0 - 1 - -  Injury with Fall? 0 - No - -  Follow up Falls evaluation completed - - - -    FALL RISK PREVENTION PERTAINING TO THE HOME:  Any stairs in or around the home? Yes  If so, are there any without handrails? Yes  Home free of loose throw rugs in walkways, pet beds, electrical cords, etc? Yes  Adequate lighting in your home to reduce risk of falls? Yes   ASSISTIVE DEVICES UTILIZED TO PREVENT FALLS:  Life alert? No  Use of a cane, walker or w/c? No  Grab bars in the bathroom? No  Shower chair or bench in shower? No  Elevated toilet seat or a handicapped toilet? No   TIMED UP AND GO:  Was the test performed? Yes .  Length of time to ambulate 10 feet: 8 sec.   Gait steady and fast without use of assistive device  Cognitive Function: Normal cognitive status assessed by direct observation by this Nurse Health Advisor. No abnormalities found.   MMSE - Mini Mental State Exam 12/22/2015  Not completed: (No Data)        Immunizations Immunization History  Administered Date(s) Administered   Influenza Split 03/13/2011, 02/21/2012   Influenza Whole 02/20/2009, 02/22/2013   Influenza, High Dose Seasonal PF 02/18/2014, 02/10/2017, 03/02/2018, 02/26/2019, 02/21/2020   Influenza-Unspecified 02/14/2016   Moderna Sars-Covid-2 Vaccination 07/15/2019, 08/12/2019   Pneumococcal Conjugate-13  03/13/2016   Pneumococcal Polysaccharide-23 07/04/2011   Zoster, Live 04/20/2013    TDAP status: Due, Education has been provided regarding the importance of this vaccine. Advised may receive this vaccine at local pharmacy or Health Dept. Aware to provide a copy of the vaccination record if obtained from local pharmacy or Health Dept. Verbalized acceptance and understanding.  Flu  Vaccine status: Up to date  Pneumococcal vaccine status: Up to date  Covid-19 vaccine status: Completed vaccines  Qualifies for Shingles Vaccine? Yes   Zostavax completed No   Shingrix Completed?: No.    Education has been provided regarding the importance of this vaccine. Patient has been advised to call insurance company to determine out of pocket expense if they have not yet received this vaccine. Advised may also receive vaccine at local pharmacy or Health Dept. Verbalized acceptance and understanding.  Screening Tests Health Maintenance  Topic Date Due   TETANUS/TDAP  Never done   Zoster Vaccines- Shingrix (1 of 2) Never done   DEXA SCAN  Never done   COVID-19 Vaccine (3 - Moderna risk series) 09/09/2019   INFLUENZA VACCINE  12/04/2020   PNA vac Low Risk Adult  Completed   HPV VACCINES  Aged Out    Health Maintenance  Health Maintenance Due  Topic Date Due   TETANUS/TDAP  Never done   Zoster Vaccines- Shingrix (1 of 2) Never done   DEXA SCAN  Never done   COVID-19 Vaccine (3 - Moderna risk series) 09/09/2019    Colorectal cancer screening: No longer required.   Mammogram status: Ordered scheduled 11/28/2020. Pt provided with contact info and advised to call to schedule appt.   Bone Density status: Ordered pt declines. Pt provided with contact info and advised to call to schedule appt.  Lung Cancer Screening: (Low Dose CT Chest recommended if Age 5-80 years, 30 pack-year currently smoking OR have quit w/in 15years.) does not qualify.   Lung Cancer Screening Referral: n/a  Additional  Screening: Hepatitis C Screening: does not qualify  Vision Screening: Recommended annual ophthalmology exams for early detection of glaucoma and other disorders of the eye. Is the patient up to date with their annual eye exam?  Yes  Who is the provider or what is the name of the office in which the patient attends annual eye exams? Dr.Tanner If pt is not established with a provider, would they like to be referred to a provider to establish care? No .   Dental Screening: Recommended annual dental exams for proper oral hygiene  Community Resource Referral / Chronic Care Management: CRR required this visit?  No   CCM required this visit?  No      Plan:     I have personally reviewed and noted the following in the patient's chart:   Medical and social history Use of alcohol, tobacco or illicit drugs  Current medications and supplements including opioid prescriptions.  Functional ability and status Nutritional status Physical activity Advanced directives List of other physicians Hospitalizations, surgeries, and ER visits in previous 12 months Vitals Screenings to include cognitive, depression, and falls Referrals and appointments  In addition, I have reviewed and discussed with patient certain preventive protocols, quality metrics, and best practice recommendations. A written personalized care plan for preventive services as well as general preventive health recommendations were provided to patient.     Randel Pigg, LPN   [id number]   Nurse Notes: none

## 2020-10-29 ENCOUNTER — Other Ambulatory Visit: Payer: Self-pay | Admitting: Family Medicine

## 2020-11-26 ENCOUNTER — Other Ambulatory Visit: Payer: Self-pay | Admitting: Family Medicine

## 2020-11-27 ENCOUNTER — Other Ambulatory Visit: Payer: Self-pay | Admitting: Family Medicine

## 2020-11-28 ENCOUNTER — Other Ambulatory Visit: Payer: Self-pay

## 2020-11-28 ENCOUNTER — Ambulatory Visit
Admission: RE | Admit: 2020-11-28 | Discharge: 2020-11-28 | Disposition: A | Discharge status: Home / Self Care | Payer: Medicare Other | Source: Ambulatory Visit | Attending: Family Medicine | Admitting: Family Medicine

## 2020-11-28 DIAGNOSIS — Z1231 Encounter for screening mammogram for malignant neoplasm of breast: Secondary | ICD-10-CM

## 2020-11-30 ENCOUNTER — Other Ambulatory Visit: Payer: Self-pay | Admitting: Family Medicine

## 2020-12-25 ENCOUNTER — Other Ambulatory Visit: Payer: Self-pay | Admitting: Family Medicine

## 2020-12-27 ENCOUNTER — Ambulatory Visit: Payer: Medicare Other | Admitting: Family Medicine

## 2020-12-27 ENCOUNTER — Other Ambulatory Visit: Payer: Self-pay

## 2020-12-27 ENCOUNTER — Encounter: Payer: Self-pay | Admitting: Family Medicine

## 2020-12-27 VITALS — BP 130/60 | HR 52 | Temp 98.0°F | Wt 157.6 lb

## 2020-12-27 DIAGNOSIS — M170 Bilateral primary osteoarthritis of knee: Secondary | ICD-10-CM

## 2020-12-27 DIAGNOSIS — E7800 Pure hypercholesterolemia, unspecified: Secondary | ICD-10-CM

## 2020-12-27 DIAGNOSIS — I10 Essential (primary) hypertension: Secondary | ICD-10-CM

## 2020-12-27 MED ORDER — HYDROCHLOROTHIAZIDE 25 MG PO TABS: 25.0000 mg | ORAL_TABLET | Freq: Every day | ORAL | 3 refills | Status: DC

## 2020-12-27 NOTE — Progress Notes (Signed)
Established Patient Office Visit  Subjective:  Patient ID: Wendy Sandoval, female    DOB: March 19, 1941  Age: 80 y.o. MRN: [id number]  CC:  Chief Complaint  Patient presents with   Medication Refill    HPI  Wendy Sandoval presents for medical follow-up.  She has significant arthritis pains which limit her mobility somewhat.  She has chronic problems including hypertension, history of fatty liver changes, hyperlipidemia, recurrent depression.  She has been widowed for several years.  Is on several regular medications including amlodipine, Lexapro, HCTZ, losartan, metoprolol, Myrbetriq, and simvastatin.  Due for follow-up lab.  She had 1 recent fall where she lost her balance when she was in the kitchen.  She had some mild knee pain afterwards but no other major injuries.  No loss of consciousness.  She is followed by urology regarding her urinary urgency.  She feels her depression symptoms are currently stable on low-dose Lexapro 5 mg daily.  Past Medical History:  Diagnosis Date   Basal cell carcinoma 04/23/2011   upper left cheek (txpbx)   Chronic UTI (urinary tract infection)    recurrent   Depression    DIVERTICULOSIS, COLON 03/17/2007   DJD (degenerative joint disease)    Esophageal stricture    Fatty liver    GERD 08/09/2008   Headache(784.0)    Hiatal hernia    HYPERCHOLESTEROLEMIA 07/07/2007   HYPERTENSION 07/07/2007   IBS (irritable bowel syndrome)    SCC (squamous cell carcinoma) 08/18/2014   right forearm (txpbx) cautery    Schatzki's ring    Squamous cell carcinoma of skin 11/29/2010   left inner shin (cx22f) KA   TMJ syndrome     Past Surgical History:  Procedure Laterality Date   ABDOMINAL HYSTERECTOMY  1998   APPENDECTOMY     BREAST EXCISIONAL BIOPSY Left    Benign    BREAST SURGERY     milk gland   LUNG SURGERY  1983    Family History  Problem Relation Age of Onset   Arthritis Other    Hyperlipidemia Other    Hypertension Other    Alcohol  abuse Other    Diabetes Other    Stroke Other    Heart disease Other    Arthritis Mother    Thyroid disease Father    Breast cancer Neg Hx    Stomach cancer Neg Hx    Colon cancer Neg Hx    Esophageal cancer Neg Hx     Social History   Socioeconomic History   Marital status: Widowed    Spouse name: Not on file   Number of children: Not on file   Years of education: Not on file   Highest education level: Not on file  Occupational History   Not on file  Tobacco Use   Smoking status: Never   Smokeless tobacco: Never  Vaping Use   Vaping Use: Never used  Substance and Sexual Activity   Alcohol use: No   Drug use: No   Sexual activity: Not on file  Other Topics Concern   Not on file  Social History Narrative   Not on file   Social Determinants of Health   Financial Resource Strain: Low Risk    Difficulty of Paying Living Expenses: Not hard at all  Food Insecurity: No Food Insecurity   Worried About Running Out of Food in the Last Year: Never true   ROremin the Last Year: Never true  Transportation Needs: No  Transportation Needs   Lack of Transportation (Medical): No   Lack of Transportation (Non-Medical): No  Physical Activity: Insufficiently Active   Days of Exercise per Week: 4 days   Minutes of Exercise per Session: 30 min  Stress: No Stress Concern Present   Feeling of Stress : Not at all  Social Connections: Moderately Isolated   Frequency of Communication with Friends and Family: Three times a week   Frequency of Social Gatherings with Friends and Family: Three times a week   Attends Religious Services: More than 4 times per year   Active Member of Clubs or Organizations: No   Attends Archivist Meetings: Never   Marital Status: Widowed  Human resources officer Violence: Not At Risk   Fear of Current or Ex-Partner: No   Emotionally Abused: No   Physically Abused: No   Sexually Abused: No    Outpatient Medications Prior to Visit   Medication Sig Dispense Refill   amLODipine (NORVASC) 5 MG tablet TAKE 1 TABLET BY MOUTH EVERY DAY 90 tablet 3   cephALEXin (KEFLEX) 250 MG capsule Take 250 mg by mouth daily. Given by urology  3   cholecalciferol (VITAMIN D) 1000 UNITS tablet Take 1,000 Units by mouth 2 (two) times daily.      escitalopram (LEXAPRO) 10 MG tablet TAKE 1/2 TABLET BY MOUTH EVERY DAY 45 tablet 1   esomeprazole (NEXIUM) 20 MG capsule Take 20 mg by mouth daily at 12 noon.     hydrochlorothiazide (HYDRODIURIL) 25 MG tablet Take 1 tablet (25 mg total) by mouth daily. *Yearly appointment required for future refills 30 tablet 0   losartan (COZAAR) 50 MG tablet TAKE 1 TABLET BY MOUTH EVERY DAY 90 tablet 0   metoprolol tartrate (LOPRESSOR) 50 MG tablet TAKE 1/2 TABLET (25 MG TOTAL) BY MOUTH 2 (TWO) TIMES DAILY. 90 tablet 3   mirabegron ER (MYRBETRIQ) 50 MG TB24 tablet Take 50 mg by mouth daily.     simvastatin (ZOCOR) 40 MG tablet TAKE 1 TABLET BY MOUTH EVERYDAY AT BEDTIME 90 tablet 2   No facility-administered medications prior to visit.    Allergies  Allergen Reactions   Codeine Sulfate     REACTION: nausea   Histamine     Anxious feeling    ROS Review of Systems  Constitutional:  Negative for fatigue and unexpected weight change.  Eyes:  Negative for visual disturbance.  Respiratory:  Negative for cough, chest tightness, shortness of breath and wheezing.   Cardiovascular:  Negative for chest pain, palpitations and leg swelling.  Genitourinary:  Negative for dysuria.  Musculoskeletal:  Positive for arthralgias.  Neurological:  Negative for dizziness, seizures, syncope, weakness, light-headedness and headaches.     Objective:    Physical Exam Constitutional:      General: She is not in acute distress.    Appearance: She is well-developed. She is not toxic-appearing.  Neck:     Thyroid: No thyromegaly.     Vascular: No JVD.  Cardiovascular:     Rate and Rhythm: Normal rate and regular rhythm.      Heart sounds:    No gallop.  Pulmonary:     Effort: Pulmonary effort is normal. No respiratory distress.     Breath sounds: Normal breath sounds. No wheezing or rales.  Musculoskeletal:     Cervical back: Neck supple.     Right lower leg: No edema.     Left lower leg: No edema.  Neurological:     General: No focal  deficit present.     Mental Status: She is alert and oriented to person, place, and time.    BP 130/60 (BP Location: Left Arm, Patient Position: Sitting, Cuff Size: Normal)   Pulse (!) 52   Temp 98 F (36.7 C) (Oral)   Wt 157 lb 9.6 oz (71.5 kg)   SpO2 99%   BMI 27.92 kg/m  Wt Readings from Last 3 Encounters:  12/27/20 157 lb 9.6 oz (71.5 kg)  10/27/20 156 lb (70.8 kg)  09/22/19 157 lb 4.8 oz (71.4 kg)     Health Maintenance Due  Topic Date Due   TETANUS/TDAP  Never done   Zoster Vaccines- Shingrix (1 of 2) Never done   DEXA SCAN  Never done   COVID-19 Vaccine (3 - Moderna risk series) 09/09/2019   INFLUENZA VACCINE  12/04/2020    There are no preventive care reminders to display for this patient.  Lab Results  Component Value Date   TSH 0.73 03/13/2016   Lab Results  Component Value Date   WBC 6.9 09/03/2016   HGB 14.5 09/03/2016   HCT 42.4 09/03/2016   MCV 94.1 09/03/2016   PLT 232.0 09/03/2016   Lab Results  Component Value Date   NA 139 09/22/2019   K 3.7 09/22/2019   CO2 27 09/22/2019   GLUCOSE 93 09/22/2019   BUN 20 09/22/2019   CREATININE 0.69 09/22/2019   BILITOT 0.7 09/22/2019   ALKPHOS 61 09/22/2019   AST 17 09/22/2019   ALT 11 09/22/2019   PROT 6.8 09/22/2019   ALBUMIN 4.1 09/22/2019   CALCIUM 9.4 09/22/2019   GFR 81.78 09/22/2019   Lab Results  Component Value Date   CHOL 140 09/22/2019   Lab Results  Component Value Date   HDL 44.40 09/22/2019   Lab Results  Component Value Date   LDLCALC 75 09/22/2019   Lab Results  Component Value Date   TRIG 102.0 09/22/2019   Lab Results  Component Value Date   CHOLHDL 3  09/22/2019   No results found for: HGBA1C    Assessment & Plan:   #1 hypertension-stable -Continue current medications -Refill HCTZ for 1 year -Schedule future labs with basic metabolic panel as lab personnel were not in office at the time she was here  #2 dyslipidemia treated with simvastatin -Future lab order for hepatic and lipid panel placed  #3 osteoarthritis involving multiple joints.  Some increased risk for falls. -We recommended that she try to get back on her exercise bike especially to work on quadricep strengthening to increase mobility and strength.   No orders of the defined types were placed in this encounter.   Follow-up: No follow-ups on file.    Carolann Littler, MD

## 2020-12-27 NOTE — Patient Instructions (Signed)
Set up lab work for around 01-25-21.  Remember to get flu vaccine this Fall.

## 2021-01-02 ENCOUNTER — Encounter: Payer: Self-pay | Admitting: Physician Assistant

## 2021-01-02 ENCOUNTER — Other Ambulatory Visit: Payer: Self-pay

## 2021-01-02 ENCOUNTER — Ambulatory Visit: Payer: Medicare Other | Admitting: Physician Assistant

## 2021-01-02 DIAGNOSIS — L720 Epidermal cyst: Secondary | ICD-10-CM

## 2021-01-02 DIAGNOSIS — Z1283 Encounter for screening for malignant neoplasm of skin: Secondary | ICD-10-CM

## 2021-01-02 DIAGNOSIS — L570 Actinic keratosis: Secondary | ICD-10-CM

## 2021-01-02 DIAGNOSIS — Z85828 Personal history of other malignant neoplasm of skin: Secondary | ICD-10-CM | POA: Diagnosis not present

## 2021-01-02 MED ORDER — TRIAMCINOLONE ACETONIDE 0.1 % EX CREA: TOPICAL_CREAM | CUTANEOUS | 1 refills | Status: DC

## 2021-01-02 NOTE — Progress Notes (Signed)
   Follow-Up Visit   Subjective  Wendy Sandoval is a 80 y.o. female who presents for the following: Annual Exam (Pink scale right lower leg x months itches, milia on the face. Patient has history of bcc scc).   The following portions of the chart were reviewed this encounter and updated as appropriate:  Tobacco  Allergies  Meds  Problems  Med Hx  Surg Hx  Fam Hx      Objective  Well appearing patient in no apparent distress; mood and affect are within normal limits.  A full examination was performed including scalp, head, eyes, ears, nose, lips, neck, chest, axillae, abdomen, back, buttocks, bilateral upper extremities, bilateral lower extremities, hands, feet, fingers, toes, fingernails, and toenails. All findings within normal limits unless otherwise noted below.  Right shin Pink crust     Left Forearm and hand (3), Right Arm, Right Dorsal Hand (2) Erythematous patches with gritty scale.  Assessment & Plan  Lichenoid keratosis Right shin  If the lesion ever bleeds or gets tender work in for office visit for biopsy  triamcinolone cream (KENALOG) 0.1 % - Right shin Apply to leg nightly to as needed  AK (actinic keratosis) (6) Left Forearm and hand (3); Right Dorsal Hand (2); Right Arm  Destruction of lesion - Left Forearm and hand, Right Arm, Right Dorsal Hand Complexity: simple   Destruction method: cryotherapy   Informed consent: discussed and consent obtained   Timeout:  patient name, date of birth, surgical site, and procedure verified Lesion destroyed using liquid nitrogen: Yes   Cryotherapy cycles:  1 Outcome: patient tolerated procedure well with no complications   Post-procedure details: wound care instructions given     I, Lynard Postlewait, PA-C, have reviewed all documentation's for this visit.  The documentation on 01/02/21 for the exam, diagnosis, procedures and orders are all accurate and complete.

## 2021-01-06 ENCOUNTER — Other Ambulatory Visit: Payer: Self-pay | Admitting: Family Medicine

## 2021-01-25 ENCOUNTER — Other Ambulatory Visit: Payer: Self-pay

## 2021-01-25 ENCOUNTER — Other Ambulatory Visit (INDEPENDENT_AMBULATORY_CARE_PROVIDER_SITE_OTHER): Payer: Medicare Other

## 2021-01-25 DIAGNOSIS — E7800 Pure hypercholesterolemia, unspecified: Secondary | ICD-10-CM

## 2021-01-25 DIAGNOSIS — I10 Essential (primary) hypertension: Secondary | ICD-10-CM | POA: Diagnosis not present

## 2021-01-25 LAB — LIPID PANEL
Cholesterol: 130 mg/dL (ref 0–200)
HDL: 42.4 mg/dL (ref >39.00)
LDL Cholesterol: 70 mg/dL (ref 0–99)
NonHDL: 87.51
Total CHOL/HDL Ratio: 3
Triglycerides: 89 mg/dL (ref 0.0–149.0)
VLDL: 17.8 mg/dL (ref 0.0–40.0)

## 2021-01-25 LAB — BASIC METABOLIC PANEL
BUN: 21 mg/dL (ref 6–23)
CO2: 27 mEq/L (ref 19–32)
Calcium: 9.5 mg/dL (ref 8.4–10.5)
Chloride: 103 mEq/L (ref 96–112)
Creatinine, Ser: 0.69 mg/dL (ref 0.40–1.20)
GFR: 81.2 mL/min (ref >60.00)
Glucose, Bld: 86 mg/dL (ref 70–99)
Potassium: 4 mEq/L (ref 3.5–5.1)
Sodium: 139 mEq/L (ref 135–145)

## 2021-01-25 LAB — HEPATIC FUNCTION PANEL
ALT: 11 U/L (ref 0–35)
AST: 17 U/L (ref 0–37)
Albumin: 3.9 g/dL (ref 3.5–5.2)
Alkaline Phosphatase: 58 U/L (ref 39–117)
Bilirubin, Direct: 0.2 mg/dL (ref 0.0–0.3)
Total Bilirubin: 0.7 mg/dL (ref 0.2–1.2)
Total Protein: 6.6 g/dL (ref 6.0–8.3)

## 2021-02-24 ENCOUNTER — Other Ambulatory Visit: Payer: Self-pay | Admitting: Family Medicine

## 2021-05-27 ENCOUNTER — Other Ambulatory Visit: Payer: Self-pay | Admitting: Family Medicine

## 2021-05-28 ENCOUNTER — Other Ambulatory Visit: Payer: Self-pay | Admitting: Family Medicine

## 2021-06-15 DIAGNOSIS — H0012 Chalazion right lower eyelid: Secondary | ICD-10-CM | POA: Diagnosis not present

## 2021-06-15 DIAGNOSIS — H0100A Unspecified blepharitis right eye, upper and lower eyelids: Secondary | ICD-10-CM | POA: Diagnosis not present

## 2021-06-15 DIAGNOSIS — H0100B Unspecified blepharitis left eye, upper and lower eyelids: Secondary | ICD-10-CM | POA: Diagnosis not present

## 2021-06-15 DIAGNOSIS — H43811 Vitreous degeneration, right eye: Secondary | ICD-10-CM | POA: Diagnosis not present

## 2021-06-19 DIAGNOSIS — B070 Plantar wart: Secondary | ICD-10-CM | POA: Diagnosis not present

## 2021-06-19 DIAGNOSIS — M79671 Pain in right foot: Secondary | ICD-10-CM | POA: Diagnosis not present

## 2021-08-26 ENCOUNTER — Other Ambulatory Visit: Payer: Self-pay | Admitting: Family Medicine

## 2021-09-28 ENCOUNTER — Telehealth: Payer: Self-pay

## 2021-09-28 NOTE — Telephone Encounter (Signed)
--  Caller reports feeling short of breath and having low blood pressure. Current blood pressure 116/53 HR 49  09/28/2021 4:10:11 PM SEE PCP WITHIN 3 DAYS Riddle, RN, Jarrett Soho  Comments User: Tedd Sias, RN Date/Time Eilene Ghazi Time): 09/28/2021 4:04:11 PM Current blood pressure 144/70 HR 54

## 2021-10-05 ENCOUNTER — Ambulatory Visit (INDEPENDENT_AMBULATORY_CARE_PROVIDER_SITE_OTHER): Payer: Medicare Other

## 2021-10-05 ENCOUNTER — Encounter: Payer: Self-pay | Admitting: Family Medicine

## 2021-10-05 ENCOUNTER — Ambulatory Visit (INDEPENDENT_AMBULATORY_CARE_PROVIDER_SITE_OTHER): Payer: Medicare Other | Admitting: Family Medicine

## 2021-10-05 VITALS — BP 124/60 | HR 55 | Temp 97.7°F | Ht 63.0 in | Wt 159.9 lb

## 2021-10-05 DIAGNOSIS — L819 Disorder of pigmentation, unspecified: Secondary | ICD-10-CM | POA: Diagnosis not present

## 2021-10-05 DIAGNOSIS — R0600 Dyspnea, unspecified: Secondary | ICD-10-CM

## 2021-10-05 DIAGNOSIS — J439 Emphysema, unspecified: Secondary | ICD-10-CM | POA: Diagnosis not present

## 2021-10-05 DIAGNOSIS — I10 Essential (primary) hypertension: Secondary | ICD-10-CM

## 2021-10-05 DIAGNOSIS — R001 Bradycardia, unspecified: Secondary | ICD-10-CM | POA: Diagnosis not present

## 2021-10-05 NOTE — Progress Notes (Unsigned)
Established Patient Office Visit  Subjective   Patient ID: Wendy Sandoval, female    DOB: May 27, 1940  Age: 81 y.o. MRN: [id number]  Chief Complaint  Patient presents with   Shortness of Breath    Patient complains of shortness of breath upon exertion, x2 months    HPI  {History (Optional):23778} Ms. Tietje is seen with at least 2-monthhistory of some shortness of breath mostly with activity.  No chest pains.  No orthopnea.  She has some chronic mild edema lower extremities but overall stable recently.  Weight stable.  No associated dizziness.  No syncope.  She also relates she has had some occasional cough over the past few months.  No appetite or weight changes.  Never smoked.  Her chronic problems include history of hypertension, IBS, GERD, fatty liver changes, hyperlipidemia, recurrent depression.  She states that her baseline pulse sometimes dips down to 40s.  She is on low-dose metoprolol.  No recent palpitations.  No pleuritic pain.  No hemoptysis.  Past Medical History:  Diagnosis Date   Basal cell carcinoma 04/23/2011   upper left cheek (txpbx)   Chronic UTI (urinary tract infection)    recurrent   Depression    DIVERTICULOSIS, COLON 03/17/2007   DJD (degenerative joint disease)    Esophageal stricture    Fatty liver    GERD 08/09/2008   Headache(784.0)    Hiatal hernia    HYPERCHOLESTEROLEMIA 07/07/2007   HYPERTENSION 07/07/2007   IBS (irritable bowel syndrome)    SCC (squamous cell carcinoma) 08/18/2014   right forearm (txpbx) cautery    Schatzki's ring    Squamous cell carcinoma of skin 11/29/2010   left inner shin (cx341f KA   TMJ syndrome    Past Surgical History:  Procedure Laterality Date   ABDOMINAL HYSTERECTOMY  1998   APPENDECTOMY     BREAST EXCISIONAL BIOPSY Left    Benign    BREAST SURGERY     milk gland   LUNG SURGERY  1983    reports that she has never smoked. She has never used smokeless tobacco. She reports that she does not drink alcohol  and does not use drugs. family history includes Alcohol abuse in an other family member; Arthritis in her mother and another family member; Diabetes in an other family member; Heart disease in an other family member; Hyperlipidemia in an other family member; Hypertension in an other family member; Stroke in an other family member; Thyroid disease in her father. Allergies  Allergen Reactions   Codeine Sulfate     REACTION: nausea   Histamine     Anxious feeling    Review of Systems  Constitutional:  Negative for chills, fever and weight loss.  Respiratory:  Positive for cough and shortness of breath. Negative for hemoptysis, sputum production and wheezing.   Cardiovascular:  Negative for chest pain, palpitations and orthopnea.     Objective:     BP 124/60 (BP Location: Left Arm, Patient Position: Sitting, Cuff Size: Normal)   Pulse (!) 55   Temp 97.7 F (36.5 C) (Oral)   Ht '5\' 3"'$  (1.6 m)   Wt 159 lb 14.4 oz (72.5 kg)   SpO2 98%   BMI 28.33 kg/m  BP Readings from Last 3 Encounters:  10/05/21 124/60  12/27/20 130/60  10/27/20 122/64   Wt Readings from Last 3 Encounters:  10/05/21 159 lb 14.4 oz (72.5 kg)  12/27/20 157 lb 9.6 oz (71.5 kg)  10/27/20 156 lb (70.8 kg)  Physical Exam Vitals reviewed.  Constitutional:      Appearance: She is well-developed.  Cardiovascular:     Rate and Rhythm: Normal rate and regular rhythm.  Pulmonary:     Effort: Pulmonary effort is normal.     Breath sounds: Normal breath sounds. No decreased breath sounds, wheezing or rales.  Musculoskeletal:     Comments: No pitting edema  Skin:    Comments: The dental note left posterior arm of darkly pigmented somewhat irregular skin lesion.  This is on the periphery of light brown freckled area.  Area of abnormal pigmentation about 4 mm diameter.  Neurological:     Mental Status: She is alert.     No results found for any visits on 10/05/21.  {Labs (Optional):23779}  The ASCVD Risk  score (Arnett DK, et al., 2019) failed to calculate for the following reasons:   The 2019 ASCVD risk score is only valid for ages 61 to 11    Assessment & Plan:   #1 several month history of some dyspnea with exertion.  No chronic lung disease.  No history of smoking.  Does not have any appetite or weight changes.  No history of known CHF.  Etiology unclear.  Does not appear to be volume overloaded.  She does relate some cough for several weeks but nonfocal exam -Obtain PA and lateral chest x-ray -Check further labs with CBC, BNP, basic metabolic panel -Check cardiac event monitor with complaints of intermittent bradycardia-though not clear any of her symptoms are related to this.  #2 hypertension well-controlled on combination therapy with losartan, amlodipine, HCTZ, and metoprolol.  #3 dark pigmented lesion left posterior arm.  Patient has pending follow-up with dermatologist in August.  We recommend she try to get into see them sooner.  If she cannot get in within the next month to see them recommended follow-up with me sooner for biopsy   No follow-ups on file.    Carolann Littler, MD

## 2021-10-05 NOTE — Patient Instructions (Signed)
Will set up cardiac event monitor to evaluate low rate and dyspnea.    SEE IF DERMATOLOGY CAN GET YOU IN SOONER TO LOOK AT LEFT ARM LESION.

## 2021-10-06 LAB — CBC WITH DIFFERENTIAL/PLATELET
Absolute Monocytes: 480 cells/uL (ref 200–950)
Basophils Absolute: 42 cells/uL (ref 0–200)
Basophils Relative: 0.7 %
Eosinophils Absolute: 162 cells/uL (ref 15–500)
Eosinophils Relative: 2.7 %
HCT: 39.5 % (ref 35.0–45.0)
Hemoglobin: 13.8 g/dL (ref 11.7–15.5)
Lymphs Abs: 2166 cells/uL (ref 850–3900)
MCH: 32.5 pg (ref 27.0–33.0)
MCHC: 34.9 g/dL (ref 32.0–36.0)
MCV: 92.9 fL (ref 80.0–100.0)
MPV: 10.6 fL (ref 7.5–12.5)
Monocytes Relative: 8 %
Neutro Abs: 3150 cells/uL (ref 1500–7800)
Neutrophils Relative %: 52.5 %
Platelets: 230 10*3/uL (ref 140–400)
RBC: 4.25 10*6/uL (ref 3.80–5.10)
RDW: 12.2 % (ref 11.0–15.0)
Total Lymphocyte: 36.1 %
WBC: 6 10*3/uL (ref 3.8–10.8)

## 2021-10-06 LAB — BASIC METABOLIC PANEL
BUN: 14 mg/dL (ref 7–25)
CO2: 28 mmol/L (ref 20–32)
Calcium: 9.3 mg/dL (ref 8.6–10.4)
Chloride: 102 mmol/L (ref 98–110)
Creat: 0.67 mg/dL (ref 0.60–0.95)
Glucose, Bld: 82 mg/dL (ref 65–99)
Potassium: 3.7 mmol/L (ref 3.5–5.3)
Sodium: 138 mmol/L (ref 135–146)

## 2021-10-06 LAB — BRAIN NATRIURETIC PEPTIDE: Brain Natriuretic Peptide: 139 pg/mL — ABNORMAL HIGH (ref <100)

## 2021-10-08 ENCOUNTER — Encounter: Payer: Self-pay | Admitting: *Deleted

## 2021-10-08 ENCOUNTER — Ambulatory Visit (INDEPENDENT_AMBULATORY_CARE_PROVIDER_SITE_OTHER): Payer: Medicare Other

## 2021-10-08 DIAGNOSIS — R0600 Dyspnea, unspecified: Secondary | ICD-10-CM

## 2021-10-08 DIAGNOSIS — R001 Bradycardia, unspecified: Secondary | ICD-10-CM

## 2021-10-08 NOTE — Progress Notes (Unsigned)
Enrolled for [employer] to mail a ZIO XT long term holter monitor to the patients address on file.   Letter with instructions mailed to patient.  DOD to read. 

## 2021-10-09 ENCOUNTER — Ambulatory Visit: Payer: Medicare Other | Admitting: Physician Assistant

## 2021-10-09 DIAGNOSIS — B079 Viral wart, unspecified: Secondary | ICD-10-CM | POA: Diagnosis not present

## 2021-10-09 DIAGNOSIS — Z1283 Encounter for screening for malignant neoplasm of skin: Secondary | ICD-10-CM | POA: Diagnosis not present

## 2021-10-09 DIAGNOSIS — Z85828 Personal history of other malignant neoplasm of skin: Secondary | ICD-10-CM

## 2021-10-09 DIAGNOSIS — D485 Neoplasm of uncertain behavior of skin: Secondary | ICD-10-CM

## 2021-10-09 DIAGNOSIS — C4362 Malignant melanoma of left upper limb, including shoulder: Secondary | ICD-10-CM

## 2021-10-09 DIAGNOSIS — C439 Malignant melanoma of skin, unspecified: Secondary | ICD-10-CM

## 2021-10-09 HISTORY — DX: Malignant melanoma of skin, unspecified: C43.9

## 2021-10-09 NOTE — Patient Instructions (Signed)

## 2021-10-10 DIAGNOSIS — R001 Bradycardia, unspecified: Secondary | ICD-10-CM | POA: Diagnosis not present

## 2021-10-10 DIAGNOSIS — R0600 Dyspnea, unspecified: Secondary | ICD-10-CM

## 2021-10-15 ENCOUNTER — Encounter: Payer: Self-pay | Admitting: Physician Assistant

## 2021-10-15 ENCOUNTER — Telehealth: Payer: Self-pay | Admitting: Physician Assistant

## 2021-10-15 NOTE — Progress Notes (Signed)
   Follow-Up Visit   Subjective  Wendy Sandoval is a 81 y.o. female who presents for the following: Annual Exam (Patient here today for skin check per patient she has a lesion that needs to be checked on her left upper posterior arm x unsure. Per patient she saw her PCP on Friday and he noticed the lesion and he wanted it checked ASAP. Patient states that she would like the red lesions on her legs rechecked no change. Personal history of non mole skin cancer. No family history of atypical moles, melanoma or non mole skin cancer. ).   The following portions of the chart were reviewed this encounter and updated as appropriate:  Tobacco  Allergies  Meds  Problems  Med Hx  Surg Hx  Fam Hx      Objective  Well appearing patient in no apparent distress; mood and affect are within normal limits.  A full examination was performed including scalp, head, eyes, ears, nose, lips, neck, chest, axillae, abdomen, back, buttocks, bilateral upper extremities, bilateral lower extremities, hands, feet, fingers, toes, fingernails, and toenails. All findings within normal limits unless otherwise noted below.  Full body skin examination- No  signs of NMSC noted at the time of the visit.   Left Elbow - Posterior Bichromic dark nested macule.        Mid Parietal Scalp Warty papule   Assessment & Plan  Encounter for screening for malignant neoplasm of skin  Yearly skin examination   Neoplasm of uncertain behavior of skin (2) Left Elbow - Posterior  Skin / nail biopsy Type of biopsy: tangential   Informed consent: discussed and consent obtained   Timeout: patient name, date of birth, surgical site, and procedure verified   Procedure prep:  Patient was prepped and draped in usual sterile fashion (Non sterile) Prep type:  Chlorhexidine Anesthesia: the lesion was anesthetized in a standard fashion   Anesthetic:  1% lidocaine w/ epinephrine 1-100,000 local infiltration Instrument used:  flexible razor blade   Outcome: patient tolerated procedure well   Post-procedure details: wound care instructions given    Specimen 1 - Surgical pathology Differential Diagnosis: r/o atypia  Check Margins: yes  Mid Parietal Scalp  Skin / nail biopsy Type of biopsy: tangential   Informed consent: discussed and consent obtained   Timeout: patient name, date of birth, surgical site, and procedure verified   Procedure prep:  Patient was prepped and draped in usual sterile fashion (Non sterile) Prep type:  Chlorhexidine Anesthesia: the lesion was anesthetized in a standard fashion   Anesthetic:  1% lidocaine w/ epinephrine 1-100,000 local infiltration Instrument used: flexible razor blade   Outcome: patient tolerated procedure well   Post-procedure details: wound care instructions given    Specimen 2 - Surgical pathology Differential Diagnosis: bcc vs scc  Check Margins: No    I, Kennice Finnie, PA-C, have reviewed all documentation's for this visit.  The documentation on 10/15/21 for the exam, diagnosis, procedures and orders are all accurate and complete.

## 2021-10-15 NOTE — Telephone Encounter (Signed)
Spoke with patient regarding her pathology results. We will schedule her for a surgical excision.

## 2021-10-26 ENCOUNTER — Encounter: Payer: Self-pay | Admitting: Family Medicine

## 2021-10-26 ENCOUNTER — Ambulatory Visit (INDEPENDENT_AMBULATORY_CARE_PROVIDER_SITE_OTHER): Payer: Medicare Other | Admitting: Family Medicine

## 2021-10-26 VITALS — BP 128/60 | HR 53 | Temp 97.9°F | Ht 63.0 in | Wt 160.5 lb

## 2021-10-26 DIAGNOSIS — C4362 Malignant melanoma of left upper limb, including shoulder: Secondary | ICD-10-CM | POA: Diagnosis not present

## 2021-10-26 DIAGNOSIS — R0600 Dyspnea, unspecified: Secondary | ICD-10-CM | POA: Diagnosis not present

## 2021-10-26 DIAGNOSIS — F339 Major depressive disorder, recurrent, unspecified: Secondary | ICD-10-CM | POA: Diagnosis not present

## 2021-10-26 DIAGNOSIS — I10 Essential (primary) hypertension: Secondary | ICD-10-CM

## 2021-10-26 MED ORDER — ESCITALOPRAM OXALATE 10 MG PO TABS: 5.0000 mg | ORAL_TABLET | Freq: Every day | ORAL | 1 refills | Status: DC

## 2021-10-30 ENCOUNTER — Telehealth: Payer: Self-pay

## 2021-10-30 ENCOUNTER — Other Ambulatory Visit: Payer: Self-pay

## 2021-10-30 DIAGNOSIS — R0600 Dyspnea, unspecified: Secondary | ICD-10-CM | POA: Diagnosis not present

## 2021-10-30 DIAGNOSIS — I471 Supraventricular tachycardia: Secondary | ICD-10-CM

## 2021-10-30 DIAGNOSIS — R001 Bradycardia, unspecified: Secondary | ICD-10-CM | POA: Diagnosis not present

## 2021-11-09 ENCOUNTER — Ambulatory Visit (INDEPENDENT_AMBULATORY_CARE_PROVIDER_SITE_OTHER): Payer: Medicare Other

## 2021-11-09 VITALS — Ht 63.0 in | Wt 160.0 lb

## 2021-11-09 DIAGNOSIS — Z0000 Encounter for general adult medical examination without abnormal findings: Secondary | ICD-10-CM

## 2021-11-09 NOTE — Progress Notes (Signed)
Subjective:   Wendy Sandoval is a 81 y.o. female who presents for Medicare Annual (Subsequent) preventive examination.  Review of Systems    Virtual Visit via Telephone Note  I connected with  [name] on 11/09/21 at  3:00 PM EDT by telephone and verified that I am speaking with the correct person using two identifiers.  Location: Patient: Home Provider: Office Persons participating in the virtual visit: patient/Nurse Health Advisor   I discussed the limitations, risks, security and privacy concerns of performing an evaluation and management service by telephone and the availability of in person appointments. The patient expressed understanding and agreed to proceed.  Interactive audio and video telecommunications were attempted between this nurse and patient, however failed, due to patient having technical difficulties OR patient did not have access to video capability.  We continued and completed visit with audio only.  Some vital signs may be absent or patient reported.   Criselda Peaches, LPN  Cardiac Risk Factors include: advanced age (>8mn, >>30women);hypertension     Objective:    Today's Vitals   11/09/21 1500  Weight: 160 lb (72.6 kg)  Height: '5\' 3"'$  (1.6 m)   Body mass index is 28.34 kg/m.     11/09/2021    3:10 PM 10/27/2020    9:27 AM 08/27/2017    9:43 AM 10/10/2016    8:52 PM 12/22/2015   11:49 AM 01/22/2015   12:08 PM  Advanced Directives  Does Patient Have a Medical Advance Directive? Yes Yes Yes No Yes Yes  Type of [place]amed[place]of [place]    [place] Does patient want to make changes to medical advance directive? No - Patient declined       Copy of [place] Chart? No - copy requested No - copy requested   No - copy requested     Current Medications (verified) Outpatient Encounter Medications as of 11/09/2021  Medication Sig    amLODipine (NORVASC) 5 MG tablet TAKE 1 TABLET BY MOUTH EVERY DAY   aspirin 325 MG tablet aspirin 325 mg tablet  Take 1 tablet twice a week by oral route.   cephALEXin (KEFLEX) 250 MG capsule Take 250 mg by mouth daily. Given by urology   cholecalciferol (VITAMIN D) 1000 UNITS tablet Take 1,000 Units by mouth 2 (two) times daily.    escitalopram (LEXAPRO) 10 MG tablet Take 0.5 tablets (5 mg total) by mouth daily.   esomeprazole (NEXIUM) 20 MG capsule Take 20 mg by mouth daily at 12 noon.   hydrochlorothiazide (HYDRODIURIL) 25 MG tablet Take 1 tablet (25 mg total) by mouth daily. *Yearly appointment required for future refills   losartan (COZAAR) 50 MG tablet TAKE 1 TABLET BY MOUTH EVERY DAY   metoprolol tartrate (LOPRESSOR) 50 MG tablet TAKE 1/2 TABLET (25 MG TOTAL) BY MOUTH 2 (TWO) TIMES DAILY.   simvastatin (ZOCOR) 40 MG tablet TAKE 1 TABLET BY MOUTH EVERYDAY AT BEDTIME   triamcinolone cream (KENALOG) 0.1 % Apply to leg nightly to as needed   No facility-administered encounter medications on file as of 11/09/2021.    Allergies (verified) Codeine sulfate and Histamine   History: Past Medical History:  Diagnosis Date   Basal cell carcinoma 04/23/2011   upper left cheek (txpbx)   Chronic UTI (urinary tract infection)    recurrent   Depression    DIVERTICULOSIS, COLON 03/17/2007   DJD (degenerative joint disease)  Esophageal stricture    Fatty liver    GERD 08/09/2008   Headache(784.0)    Hiatal hernia    HYPERCHOLESTEROLEMIA 07/07/2007   HYPERTENSION 07/07/2007   IBS (irritable bowel syndrome)    SCC (squamous cell carcinoma) 08/18/2014   right forearm (txpbx) cautery    Schatzki's ring    Squamous cell carcinoma of skin 11/29/2010   left inner shin (cx43f) KA   TMJ syndrome    Past Surgical History:  Procedure Laterality Date   ABDOMINAL HYSTERECTOMY  1998   APPENDECTOMY     BREAST EXCISIONAL BIOPSY Left    Benign    BREAST SURGERY     milk gland   LUNG SURGERY  1983    Family History  Problem Relation Age of Onset   Arthritis Other    Hyperlipidemia Other    Hypertension Other    Alcohol abuse Other    Diabetes Other    Stroke Other    Heart disease Other    Arthritis Mother    Thyroid disease Father    Breast cancer Neg Hx    Stomach cancer Neg Hx    Colon cancer Neg Hx    Esophageal cancer Neg Hx    Social History   Socioeconomic History   Marital status: Widowed    Spouse name: Not on file   Number of children: Not on file   Years of education: Not on file   Highest education level: Not on file  Occupational History   Not on file  Tobacco Use   Smoking status: Never   Smokeless tobacco: Never  Vaping Use   Vaping Use: Never used  Substance and Sexual Activity   Alcohol use: No   Drug use: No   Sexual activity: Not on file  Other Topics Concern   Not on file  Social History Narrative   Not on file   Social Determinants of Health   Financial Resource Strain: Low Risk  (11/09/2021)   Overall Financial Resource Strain (CARDIA)    Difficulty of Paying Living Expenses: Not hard at all  Food Insecurity: No Food Insecurity (11/09/2021)   Hunger Vital Sign    Worried About Running Out of Food in the Last Year: Never true    RPanamain the Last Year: Never true  Transportation Needs: No Transportation Needs (11/09/2021)   PRAPARE - THydrologist(Medical): No    Lack of Transportation (Non-Medical): No  Physical Activity: Inactive (11/09/2021)   Exercise Vital Sign    Days of Exercise per Week: 0 days    Minutes of Exercise per Session: 0 min  Stress: No Stress Concern Present (11/09/2021)   [place]   Feeling of Stress : Not at all  Social Connections: Moderately Integrated (11/09/2021)   Social Connection and Isolation Panel [NHANES]    Frequency of Communication with Friends and Family: More than three times a week    Frequency  of Social Gatherings with Friends and Family: More than three times a week    Attends Religious Services: More than 4 times per year    Active Member of [employer] Organizations: Yes    Attends CArchivistMeetings: More than 4 times per year    Marital Status: Widowed    Tobacco Counseling Counseling given: Not Answered   Clinical Intake:   Diabetic?  No   Activities of Daily Living  11/09/2021    3:07 PM  In your present state of health, do you have any difficulty performing the following activities:  Hearing? 0  Vision? 0  Difficulty concentrating or making decisions? 0  Walking or climbing stairs? 0  Dressing or bathing? 0  Doing errands, shopping? 0  Preparing Food and eating ? N  Using the Toilet? N  In the past six months, have you accidently leaked urine? Y  Comment wears pads. Followed by Urologist  Do you have problems with loss of bowel control? N  Managing your Medications? N  Managing your Finances? N  Housekeeping or managing your Housekeeping? N    Patient Care Team: Eulas Post, MD as PCP - [facility] [place], Ronalee Red, PA-C as Physician Assistant (Dermatology)  Indicate any recent Medical Services you may have received from other than [facility] providers in the past year (date may be approximate).     Assessment:   This is a routine wellness examination for Osino.  Hearing/Vision screen Hearing Screening - Comments:: No hearing difficulty Vision Screening - Comments:: Wears glasses. Followed by Dr Satira Sark  Dietary issues and exercise activities discussed: Exercise limited by: None identified   Goals Addressed               This Visit's Progress     Lose 20-25lbs (pt-stated)        I want to cut down on my sweets.       Depression Screen    11/09/2021    3:04 PM 10/05/2021    3:07 PM 10/27/2020    9:28 AM 10/27/2020    9:26 AM 09/22/2019    8:50 AM 08/27/2017    9:45 AM 02/10/2017    8:33 AM  PHQ 2/9 Scores  PHQ - 2 Score  0 0 0 0 0 0 0  PHQ- 9 Score 0 3         Fall Risk    11/09/2021    3:08 PM 10/05/2021    3:06 PM 10/27/2020    9:28 AM 09/22/2019    8:50 AM 08/27/2017    9:45 AM  Fall Risk   Falls in the past year? 0 1 0 0 No  Number falls in past yr: 0 0 0 0   Injury with Fall? 0 1 0 0   Risk for fall due to : No Fall Risks No Fall Risks History of fall(s)    Follow up  Falls evaluation completed  Falls evaluation completed     FALL RISK PREVENTION PERTAINING TO THE HOME:  Any stairs in or around the home? Yes  If so, are there any without handrails? No  Home free of loose throw rugs in walkways, pet beds, electrical cords, etc? Yes  Adequate lighting in your home to reduce risk of falls? Yes   ASSISTIVE DEVICES UTILIZED TO PREVENT FALLS:  Life alert? No  Use of a cane, walker or w/c? No  Grab bars in the bathroom? Yes  Shower chair or bench in shower? Yes  Elevated toilet seat or a handicapped toilet? Yes   TIMED UP AND GO:  Was the test performed? No . Audio Visit  Cognitive Function:        11/09/2021    3:10 PM  6CIT Screen  What Year? 0 points  What month? 0 points  What time? 0 points  Count back from 20 0 points  Months in reverse 0 points  Repeat phrase 0 points  Total Score 0  points    Immunizations Immunization History  Administered Date(s) Administered   Influenza Split 03/13/2011, 02/21/2012   Influenza Whole 02/20/2009, 02/22/2013   Influenza, High Dose Seasonal PF 02/18/2014, 02/10/2017, 03/02/2018, 02/26/2019, 02/21/2020   Influenza-Unspecified 02/14/2016   Moderna Sars-Covid-2 Vaccination 07/15/2019, 08/12/2019   Pneumococcal Conjugate-13 03/13/2016   Pneumococcal Polysaccharide-23 07/04/2011   Zoster, Live 04/20/2013    TDAP status: Due, Education has been provided regarding the importance of this vaccine. Advised may receive this vaccine at local pharmacy or Health Dept. Aware to provide a copy of the vaccination record if obtained from local pharmacy or  Health Dept. Verbalized acceptance and understanding.   Pneumococcal vaccine status: Up to date  Covid-19 vaccine status: Completed vaccines  Qualifies for Shingles Vaccine? Yes   Zostavax completed No   Shingrix Completed?: No.    Education has been provided regarding the importance of this vaccine. Patient has been advised to call insurance company to determine out of pocket expense if they have not yet received this vaccine. Advised may also receive vaccine at local pharmacy or Health Dept. Verbalized acceptance and understanding.  Screening Tests Health Maintenance  Topic Date Due   COVID-19 Vaccine (3 - Moderna risk series) 11/25/2021 (Originally 09/09/2019)   Zoster Vaccines- Shingrix (1 of 2) 02/09/2022 (Originally 04/27/1960)   DEXA SCAN  11/10/2022 (Originally 04/27/2004)   TETANUS/TDAP  11/10/2022 (Originally 04/27/1960)   INFLUENZA VACCINE  12/04/2021   Pneumonia Vaccine 76+ Years old  Completed   HPV VACCINES  Aged Out    Health Maintenance  There are no preventive care reminders to display for this patient.   Colorectal cancer screening: No longer required.   Mammogram status: No longer required due to Age.  Bone Density status: Ordered Patient deferred. Pt provided with contact info and advised to call to schedule appt.  Lung Cancer Screening: (Low Dose CT Chest recommended if Age 39-80 years, 30 pack-year currently smoking OR have quit w/in 15years.) does not qualify.     Additional Screening:  Hepatitis C Screening: does not qualify; Completed   Vision Screening: Recommended annual ophthalmology exams for early detection of glaucoma and other disorders of the eye. Is the patient up to date with their annual eye exam?  Yes  Who is the provider or what is the name of the office in which the patient attends annual eye exams? Dr Satira Sark If pt is not established with a provider, would they like to be referred to a provider to establish care? No .   Dental  Screening: Recommended annual dental exams for proper oral hygiene  Community Resource Referral / Chronic Care Management:  CRR required this visit?  No   CCM required this visit?  No      Plan:     I have personally reviewed and noted the following in the patient's chart:   Medical and social history Use of alcohol, tobacco or illicit drugs  Current medications and supplements including opioid prescriptions.  Functional ability and status Nutritional status Physical activity Advanced directives List of other physicians Hospitalizations, surgeries, and ER visits in previous 12 months Vitals Screenings to include cognitive, depression, and falls Referrals and appointments  In addition, I have reviewed and discussed with patient certain preventive protocols, quality metrics, and best practice recommendations. A written personalized care plan for preventive services as well as general preventive health recommendations were provided to patient.     Criselda Peaches, LPN   05/11/1094   Nurse Notes: None

## 2021-11-09 NOTE — Patient Instructions (Addendum)
Wendy Sandoval , Thank you for taking time to come for your Medicare Wellness Visit. I appreciate your ongoing commitment to your health goals. Please review the following plan we discussed and let me know if I can assist you in the future.   These are the goals we discussed:  Goals       Exercise 150 minutes per week (moderate activity)      Think about exercise again; gentle yoga; piliates; silver sneaker;  Interval exercises       Lose 20-25lbs (pt-stated)      I want to cut down on my sweets.      Weight (lb) < 145 lb (65.8 kg)      15lbs is enough or 150lb  A normal BMI (body mass index) 81 yo 27 over 65; and you are 24  Cut down on sweets; cut back on pepsi  You can but YASSO bars, which are 100 calories and ice cream with yogurt          Weight Loss Achieved      Evidence-based guidance:  Review medication that may contribute to weight gain, such as corticosteroid, beta-blocker, tricyclic antidepressant, oral antihyperglycemic; advocate for changes when appropriate.  Perform or refer to registered dietitian to perform comprehensive nutrition assessment that includes disordered-eating behaviors, such as binge-eating, emotional or compulsive eating, grazing.  Counsel patient regarding health risks of obesity and that weight loss goal of 5 to 10 percent of initial weight will improve risk.  Recommend initial weight loss goal of 3 to 5 percent of bodyweight; increase weight-loss goals based on patient success as achieving greater weight loss continues to reduce risk.  Propose a calorie-reduced diet based on the patient's preferences and health status.  Provide ongoing emotional support or cognitive behavioral therapy and dietitian services (individual, group, virtual) over at least 6 months with a minimum of 14 encounters to best facilitate weight loss.  Provide monthly follow-up for 12 months when weight loss goal is met to assist with maintenance of weight loss.  Encourage  increased physical activity or exercise based on individual age, risk, and ability up to 200 to 300 minutes per week that includes aerobic and resistance training.  Encourage reduction in sedentary behaviors by replacing them with nonexercise yet active leisure pursuits.  Identify physical barriers, such as change in posture, balance, gait patterns, joint pain, and environmental barriers to activity.  Consider referral to rehabilitation therapy, especially when mobility or function is impaired due to osteoarthritis and obesity.  Consider referral to weight-loss program that has published evidence of safety and efficacy if on-site intensive intervention is unavailable or patient preference.  Prepare patient for use of pharmacologic therapy as an adjunct to lifestyle changes based on body mass index, patient agreement and presence of risk factors or comorbidities.  Evaluate efficacy of pharmacologic therapy (weight loss) and tolerance to medication periodically.  Engage in shared decision-making regarding referral to bariatric surgeon for consultation and evaluation when weight-loss goal has not been accomplished by behavioral therapy with or without pharmacologic therapy.   Notes:         This is a list of the screening recommended for you and due dates:  Health Maintenance  Topic Date Due   COVID-19 Vaccine (3 - Moderna risk series) 11/25/2021*   Zoster (Shingles) Vaccine (1 of 2) 02/09/2022*   DEXA scan (bone density measurement)  11/10/2022*   Tetanus Vaccine  11/10/2022*   Flu Shot  12/04/2021   Pneumonia Vaccine  Completed  HPV Vaccine  Aged Out  *Topic was postponed. The date shown is not the original due date.    Advanced directives: Yes  Conditions/risks identified: None  Next appointment: Follow up in one year for your annual wellness visit     Preventive Care 65 Years and Older, Female Preventive care refers to lifestyle choices and visits with your health care provider  that can promote health and wellness. What does preventive care include? A yearly physical exam. This is also called an annual well check. Dental exams once or twice a year. Routine eye exams. Ask your health care provider how often you should have your eyes checked. Personal lifestyle choices, including: Daily care of your teeth and gums. Regular physical activity. Eating a healthy diet. Avoiding tobacco and drug use. Limiting alcohol use. Practicing safe sex. Taking low-dose aspirin every day. Taking vitamin and mineral supplements as recommended by your health care provider. What happens during an annual well check? The services and screenings done by your health care provider during your annual well check will depend on your age, overall health, lifestyle risk factors, and family history of disease. Counseling  Your health care provider may ask you questions about your: Alcohol use. Tobacco use. Drug use. Emotional well-being. Home and relationship well-being. Sexual activity. Eating habits. History of falls. Memory and ability to understand (cognition). Work and work Statistician. Reproductive health. Screening  You may have the following tests or measurements: Height, weight, and BMI. Blood pressure. Lipid and cholesterol levels. These may be checked every 5 years, or more frequently if you are over 20 years old. Skin check. Lung cancer screening. You may have this screening every year starting at age 91 if you have a 30-pack-year history of smoking and currently smoke or have quit within the past 15 years. Fecal occult blood test (FOBT) of the stool. You may have this test every year starting at age 28. Flexible sigmoidoscopy or colonoscopy. You may have a sigmoidoscopy every 5 years or a colonoscopy every 10 years starting at age 37. Hepatitis C blood test. Hepatitis B blood test. Sexually transmitted disease (STD) testing. Diabetes screening. This is done by checking  your blood sugar (glucose) after you have not eaten for a while (fasting). You may have this done every 1-3 years. Bone density scan. This is done to screen for osteoporosis. You may have this done starting at age 69. Mammogram. This may be done every 1-2 years. Talk to your health care provider about how often you should have regular mammograms. Talk with your health care provider about your test results, treatment options, and if necessary, the need for more tests. Vaccines  Your health care provider may recommend certain vaccines, such as: Influenza vaccine. This is recommended every year. Tetanus, diphtheria, and acellular pertussis (Tdap, Td) vaccine. You may need a Td booster every 10 years. Zoster vaccine. You may need this after age 59. Pneumococcal 13-valent conjugate (PCV13) vaccine. One dose is recommended after age 57. Pneumococcal polysaccharide (PPSV23) vaccine. One dose is recommended after age 14. Talk to your health care provider about which screenings and vaccines you need and how often you need them. This information is not intended to replace advice given to you by your health care provider. Make sure you discuss any questions you have with your health care provider. Document Released: 05/19/2015 Document Revised: 01/10/2016 Document Reviewed: 02/21/2015 Elsevier Interactive Patient Education  2017 [place] Prevention in the Home Falls can cause injuries. They can happen  to people of all ages. There are many things you can do to make your home safe and to help prevent falls. What can I do on the outside of my home? Regularly fix the edges of walkways and driveways and fix any cracks. Remove anything that might make you trip as you walk through a door, such as a raised step or threshold. Trim any bushes or trees on the path to your home. Use bright outdoor lighting. Clear any walking paths of anything that might make someone trip, such as rocks or  tools. Regularly check to see if handrails are loose or broken. Make sure that both sides of any steps have handrails. Any raised decks and porches should have guardrails on the edges. Have any leaves, snow, or ice cleared regularly. Use sand or salt on walking paths during winter. Clean up any spills in your garage right away. This includes oil or grease spills. What can I do in the bathroom? Use night lights. Install grab bars by the toilet and in the tub and shower. Do not use towel bars as grab bars. Use non-skid mats or decals in the tub or shower. If you need to sit down in the shower, use a plastic, non-slip stool. Keep the floor dry. Clean up any water that spills on the floor as soon as it happens. Remove soap buildup in the tub or shower regularly. Attach bath mats securely with double-sided non-slip rug tape. Do not have throw rugs and other things on the floor that can make you trip. What can I do in the bedroom? Use night lights. Make sure that you have a light by your bed that is easy to reach. Do not use any sheets or blankets that are too big for your bed. They should not hang down onto the floor. Have a firm chair that has side arms. You can use this for support while you get dressed. Do not have throw rugs and other things on the floor that can make you trip. What can I do in the kitchen? Clean up any spills right away. Avoid walking on wet floors. Keep items that you use a lot in easy-to-reach places. If you need to reach something above you, use a strong step stool that has a grab bar. Keep electrical cords out of the way. Do not use floor polish or wax that makes floors slippery. If you must use wax, use non-skid floor wax. Do not have throw rugs and other things on the floor that can make you trip. What can I do with my stairs? Do not leave any items on the stairs. Make sure that there are handrails on both sides of the stairs and use them. Fix handrails that are  broken or loose. Make sure that handrails are as long as the stairways. Check any carpeting to make sure that it is firmly attached to the stairs. Fix any carpet that is loose or worn. Avoid having throw rugs at the top or bottom of the stairs. If you do have throw rugs, attach them to the floor with carpet tape. Make sure that you have a light switch at the top of the stairs and the bottom of the stairs. If you do not have them, ask someone to add them for you. What else can I do to help prevent falls? Wear shoes that: Do not have high heels. Have rubber bottoms. Are comfortable and fit you well. Are closed at the toe. Do not wear sandals. If  you use a stepladder: Make sure that it is fully opened. Do not climb a closed stepladder. Make sure that both sides of the stepladder are locked into place. Ask someone to hold it for you, if possible. Clearly mark and make sure that you can see: Any grab bars or handrails. First and last steps. Where the edge of each step is. Use tools that help you move around (mobility aids) if they are needed. These include: Canes. Walkers. Scooters. Crutches. Turn on the lights when you go into a dark area. Replace any light bulbs as soon as they burn out. Set up your furniture so you have a clear path. Avoid moving your furniture around. If any of your floors are uneven, fix them. If there are any pets around you, be aware of where they are. Review your medicines with your doctor. Some medicines can make you feel dizzy. This can increase your chance of falling. Ask your doctor what other things that you can do to help prevent falls. This information is not intended to replace advice given to you by your health care provider. Make sure you discuss any questions you have with your health care provider. Document Released: 02/16/2009 Document Revised: 09/28/2015 Document Reviewed: 05/27/2014 Elsevier Interactive Patient Education  2017 [employer].

## 2021-11-12 ENCOUNTER — Ambulatory Visit (INDEPENDENT_AMBULATORY_CARE_PROVIDER_SITE_OTHER): Payer: Medicare Other | Admitting: Physician Assistant

## 2021-11-12 DIAGNOSIS — C4362 Malignant melanoma of left upper limb, including shoulder: Secondary | ICD-10-CM

## 2021-11-12 DIAGNOSIS — D0362 Melanoma in situ of left upper limb, including shoulder: Secondary | ICD-10-CM | POA: Diagnosis not present

## 2021-11-12 NOTE — Patient Instructions (Signed)

## 2021-11-20 ENCOUNTER — Telehealth: Payer: Self-pay | Admitting: *Deleted

## 2021-11-20 NOTE — Telephone Encounter (Signed)
Path to patient. Patient is going to come by tomorrow to have [name] check her sutures she thinks one has come un done.

## 2021-11-20 NOTE — Telephone Encounter (Signed)
-----   Message from Warren Danes, [place] sent at 11/19/2021  2:42 PM EDT ----- Clear margin

## 2021-11-21 ENCOUNTER — Encounter: Payer: Self-pay | Admitting: Dermatology

## 2021-11-21 ENCOUNTER — Ambulatory Visit (INDEPENDENT_AMBULATORY_CARE_PROVIDER_SITE_OTHER): Payer: Medicare Other | Admitting: Dermatology

## 2021-11-21 DIAGNOSIS — L089 Local infection of the skin and subcutaneous tissue, unspecified: Secondary | ICD-10-CM

## 2021-11-21 DIAGNOSIS — Z85820 Personal history of malignant melanoma of skin: Secondary | ICD-10-CM

## 2021-11-21 MED ORDER — MUPIROCIN 2 % EX OINT: 1.0000 | TOPICAL_OINTMENT | Freq: Every day | CUTANEOUS | 0 refills | Status: DC

## 2021-11-22 ENCOUNTER — Other Ambulatory Visit: Payer: Self-pay | Admitting: Family Medicine

## 2021-11-26 ENCOUNTER — Ambulatory Visit (INDEPENDENT_AMBULATORY_CARE_PROVIDER_SITE_OTHER): Payer: Medicare Other

## 2021-11-26 DIAGNOSIS — Z4802 Encounter for removal of sutures: Secondary | ICD-10-CM

## 2021-11-26 NOTE — Progress Notes (Signed)
NTS Suture removal, No s/s of infection, path to pt.

## 2021-11-28 LAB — ANAEROBIC AND AEROBIC CULTURE
MICRO NUMBER:: 13673039
MICRO NUMBER:: 13673040
SPECIMEN QUALITY:: ADEQUATE
SPECIMEN QUALITY:: ADEQUATE

## 2021-11-29 ENCOUNTER — Other Ambulatory Visit: Payer: Self-pay | Admitting: Family Medicine

## 2021-11-29 DIAGNOSIS — Z1231 Encounter for screening mammogram for malignant neoplasm of breast: Secondary | ICD-10-CM

## 2021-12-06 ENCOUNTER — Encounter: Payer: Self-pay | Admitting: Physician Assistant

## 2021-12-06 NOTE — Progress Notes (Signed)
   Follow-Up Visit   Subjective  Wendy Sandoval is a 81 y.o. female who presents for the following: Procedure (Here to treat Melanoma on left elbow.).   The following portions of the chart were reviewed this encounter and updated as appropriate:  Tobacco  Allergies  Meds  Problems  Med Hx  Surg Hx  Fam Hx      Objective  Well appearing patient in no apparent distress; mood and affect are within normal limits.  A focused examination was performed including left arm. Relevant physical exam findings are noted in the Assessment and Plan.  Left Elbow posterior Positive black pigment. Lesion identified by Robyne Askew, PA-C and nurse in room.     Assessment & Plan  Malignant melanoma of arm, left (HCC) Left Elbow posterior  Skin excision  Margin per side (cm):  1 Total excision diameter (cm):  9 Informed consent: discussed and consent obtained   Timeout: patient name, date of birth, surgical site, and procedure verified   Anesthesia: the lesion was anesthetized in a standard fashion   Anesthetic:  1% lidocaine w/ epinephrine 1-100,000 local infiltration Instrument used: #15 blade   Hemostasis achieved with: suture, pressure and electrodesiccation   Outcome: patient tolerated procedure well with no complications   Post-procedure details: sterile dressing applied and wound care instructions given   Dressing type: bandage, petrolatum and pressure dressing    Skin repair Complexity:  Simple Final length (cm):  9 Informed consent: discussed and consent obtained   Timeout: patient name, date of birth, surgical site, and procedure verified   Procedure prep:  Patient was prepped and draped in usual sterile fashion (non sterile) Prep type:  Chlorhexidine Anesthesia: the lesion was anesthetized in a standard fashion   Undermining: edges undermined   Fine/surface layer approximation (top stitches):  Suture size:  3-0 Suture type: Vicryl Rapide (coated polyglactin 910)    Suture type comment:  Nylon Stitches: simple running   Suture removal (days):  14 Hemostasis achieved with: suture, pressure and electrodesiccation Outcome: patient tolerated procedure well with no complications   Post-procedure details: sterile dressing applied and wound care instructions given   Post-procedure details comment:  Non sterile pressure  Dressing type: pressure dressing and petrolatum   Additional details:  3-0 Vicryl x 12 4-0 Ethilon x 1 running  Specimen 1 - Surgical pathology Differential Diagnosis: Malignant Melanoma  [phone]  Check Margins: yes - Inferior Margin Stained    I, Santiago Graf, PA-C, have reviewed all documentation's for this visit.  The documentation on 12/06/21 for the exam, diagnosis, procedures and orders are all accurate and complete.

## 2021-12-14 ENCOUNTER — Encounter: Payer: Self-pay | Admitting: Dermatology

## 2021-12-14 NOTE — Progress Notes (Signed)
   Follow-Up Visit   Subjective  Wendy Sandoval is a 81 y.o. female who presents for the following: Wound Check (Patient here today to have her wound on her left arm checked. Per patient she believes her sutures are coming un done. ).  Surgical wound Location:  Duration:  Quality:  Associated Signs/Symptoms: Modifying Factors:  Severity:  Timing: Context:   Objective  Well appearing patient in no apparent distress; mood and affect are within normal limits. Left Elbow Posterior Agree with patient there is 1 suture that is loose but no separation of wound edges.    A focused examination was performed including arm. Relevant physical exam findings are noted in the Assessment and Plan.   Assessment & Plan    History of melanoma Left Elbow Posterior  Steri-Strip.  Topical mupirocin.  mupirocin ointment (BACTROBAN) 2 % - Left Elbow Posterior Apply 1 Application topically daily.  Local infection of skin and subcutaneous tissue  Related Procedures Anaerobic and Aerobic Culture  Related Medications mupirocin ointment (BACTROBAN) 2 % Apply 1 Application topically daily.      I, Lavonna Monarch, MD, have reviewed all documentation for this visit.  The documentation on 12/14/21 for the exam, diagnosis, procedures, and orders are all accurate and complete.

## 2021-12-18 NOTE — Progress Notes (Unsigned)
CARDIOLOGY CONSULT NOTE       Patient ID: Wendy Sandoval MRN: [id number] DOB/AGE: 1940/08/26 81 y.o.  Admit date: (Not on file) Referring Physician: Burchette Primary Physician: Eulas Post, MD Primary Cardiologist: New Reason for Consultation: Tachycardia  Active Problems:   * No active hospital problems. *   HPI:  81 y.o. referred by Dr Elease Hashimoto for SVT. History of HTN, IBS, GERD, Arthritis, HLD, depression and fatty liver monitor done 10/30/21 sowed average HR of 56 bpm range 47-97 with night time junctional rhythm and 2 episodes of NSVT longest 14 beats and 10 episodes of SVT longest only 10 beats Non of these rhythms are clinically significant given short duration The low junctional HR;s occurred between 8-10 am Events auto triggered not related to symptoms She is currently on lopressor 25 mg bid. Also takes norvasc and losartan and HCTZ for BP.  On statin for LDL which was 70 on 01/25/21   ***  ROS All other systems reviewed and negative except as noted above  Past Medical History:  Diagnosis Date   Basal cell carcinoma 04/23/2011   upper left cheek (txpbx)   Chronic UTI (urinary tract infection)    recurrent   Clark level II melanoma ([place]) 10/09/2021   Left Elbow Post (excision)   Depression    DIVERTICULOSIS, COLON 03/17/2007   DJD (degenerative joint disease)    Esophageal stricture    Fatty liver    GERD 08/09/2008   Headache(784.0)    Hiatal hernia    HYPERCHOLESTEROLEMIA 07/07/2007   HYPERTENSION 07/07/2007   IBS (irritable bowel syndrome)    SCC (squamous cell carcinoma) 08/18/2014   right forearm (txpbx) cautery    Schatzki's ring    Squamous cell carcinoma of skin 11/29/2010   left inner shin (cx36f) KA   TMJ syndrome     Family History  Problem Relation Age of Onset   Arthritis Other    Hyperlipidemia Other    Hypertension Other    Alcohol abuse Other    Diabetes Other    Stroke Other    Heart disease Other    Arthritis Mother     Thyroid disease Father    Breast cancer Neg Hx    Stomach cancer Neg Hx    Colon cancer Neg Hx    Esophageal cancer Neg Hx     Social History   Socioeconomic History   Marital status: Widowed    Spouse name: Not on file   Number of children: Not on file   Years of education: Not on file   Highest education level: Not on file  Occupational History   Not on file  Tobacco Use   Smoking status: Never   Smokeless tobacco: Never  Vaping Use   Vaping Use: Never used  Substance and Sexual Activity   Alcohol use: No   Drug use: No   Sexual activity: Not on file  Other Topics Concern   Not on file  Social History Narrative   Not on file   Social Determinants of Health   Financial Resource Strain: Low Risk  (11/09/2021)   Overall Financial Resource Strain (CARDIA)    Difficulty of Paying Living Expenses: Not hard at all  Food Insecurity: No Food Insecurity (11/09/2021)   Hunger Vital Sign    Worried About Running Out of Food in the Last Year: Never true    Ran Out of Food in the Last Year: Never true  Transportation Needs: No Transportation Needs (11/09/2021)  PRAPARE - Hydrologist (Medical): No    Lack of Transportation (Non-Medical): No  Physical Activity: Inactive (11/09/2021)   Exercise Vital Sign    Days of Exercise per Week: 0 days    Minutes of Exercise per Session: 0 min  Stress: No Stress Concern Present (11/09/2021)   [place]    Feeling of Stress : Not at all  Social Connections: Moderately Integrated (11/09/2021)   Social Connection and Isolation Panel [NHANES]    Frequency of Communication with Friends and Family: More than three times a week    Frequency of Social Gatherings with Friends and Family: More than three times a week    Attends Religious Services: More than 4 times per year    Active Member of [employer] or Organizations: Yes    Attends Archivist Meetings:  More than 4 times per year    Marital Status: Widowed  Intimate Partner Violence: Not At Risk (11/09/2021)   Humiliation, Afraid, Rape, and Kick questionnaire    Fear of Current or Ex-Partner: No    Emotionally Abused: No    Physically Abused: No    Sexually Abused: No    Past Surgical History:  Procedure Laterality Date   ABDOMINAL HYSTERECTOMY  1998   APPENDECTOMY     BREAST EXCISIONAL BIOPSY Left    Benign    BREAST SURGERY     milk gland   LUNG SURGERY  1983      Current Outpatient Medications:    amLODipine (NORVASC) 5 MG tablet, TAKE 1 TABLET BY MOUTH EVERY DAY, Disp: 90 tablet, Rfl: 3   aspirin 325 MG tablet, aspirin 325 mg tablet  Take 1 tablet twice a week by oral route., Disp: , Rfl:    cephALEXin (KEFLEX) 250 MG capsule, Take 250 mg by mouth daily. Given by urology, Disp: , Rfl: 3   cholecalciferol (VITAMIN D) 1000 UNITS tablet, Take 1,000 Units by mouth 2 (two) times daily. , Disp: , Rfl:    escitalopram (LEXAPRO) 10 MG tablet, Take 0.5 tablets (5 mg total) by mouth daily., Disp: 45 tablet, Rfl: 1   esomeprazole (NEXIUM) 20 MG capsule, Take 20 mg by mouth daily at 12 noon., Disp: , Rfl:    hydrochlorothiazide (HYDRODIURIL) 25 MG tablet, Take 1 tablet (25 mg total) by mouth daily. *Yearly appointment required for future refills, Disp: 90 tablet, Rfl: 3   losartan (COZAAR) 50 MG tablet, TAKE 1 TABLET BY MOUTH EVERY DAY, Disp: 90 tablet, Rfl: 0   metoprolol tartrate (LOPRESSOR) 50 MG tablet, TAKE 1/2 TABLET (25 MG TOTAL) BY MOUTH 2 (TWO) TIMES DAILY., Disp: 90 tablet, Rfl: 3   mupirocin ointment (BACTROBAN) 2 %, Apply 1 Application topically daily., Disp: 22 g, Rfl: 0   simvastatin (ZOCOR) 40 MG tablet, TAKE 1 TABLET BY MOUTH EVERYDAY AT BEDTIME, Disp: 90 tablet, Rfl: 2   triamcinolone cream (KENALOG) 0.1 %, Apply to leg nightly to as needed, Disp: 45 g, Rfl: 1    Physical Exam: There were no vitals taken for this visit.    Affect appropriate Healthy:  appears stated  age 50: normal Neck supple with no adenopathy JVP normal no bruits no thyromegaly Lungs clear with no wheezing and good diaphragmatic motion Heart:  S1/S2 no murmur, no rub, gallop or click PMI normal Abdomen: benighn, BS positve, no tenderness, no AAA no bruit.  No HSM or HJR Distal pulses intact with no bruits No  edema Neuro non-focal Skin warm and dry No muscular weakness   Labs:   Lab Results  Component Value Date   WBC 6.0 10/05/2021   HGB 13.8 10/05/2021   HCT 39.5 10/05/2021   MCV 92.9 10/05/2021   PLT 230 10/05/2021   No results for input(s): "NA", "K", "CL", "CO2", "BUN", "CREATININE", "CALCIUM", "PROT", "BILITOT", "ALKPHOS", "ALT", "AST", "GLUCOSE" in the last 168 hours.  Invalid input(s): "LABALBU" No results found for: "CKTOTAL", "CKMB", "CKMBINDEX", "TROPONINI"  Lab Results  Component Value Date   CHOL 130 01/25/2021   CHOL 140 09/22/2019   CHOL 135 12/01/2018   Lab Results  Component Value Date   HDL 42.40 01/25/2021   HDL 44.40 09/22/2019   HDL 43.90 12/01/2018   Lab Results  Component Value Date   LDLCALC 70 01/25/2021   LDLCALC 75 09/22/2019   LDLCALC 70 12/01/2018   Lab Results  Component Value Date   TRIG 89.0 01/25/2021   TRIG 102.0 09/22/2019   TRIG 104.0 12/01/2018   Lab Results  Component Value Date   CHOLHDL 3 01/25/2021   CHOLHDL 3 09/22/2019   CHOLHDL 3 12/01/2018   No results found for: "LDLDIRECT"    Radiology: No results found.  EKG: ***   ASSESSMENT AND PLAN:   Tachycardia:  self limited NSVT/SVT on low dose beta blocker unable to titrate due to bradycardia Will check labs including Hct/TSH/T4  Events auto trigger on monitor No indication for ablation / PPM especially given age. Check TTE for EF HLD:  continue statin  HTN:  Well controlled.  Continue current medications and low sodium Dash type diet.   Depression: continue Lexapro   Labs Hct/TSH/T4 TTE   F/U with cardiology PRN  Signed: Jenkins Rouge 12/18/2021, 12:26 PM

## 2021-12-19 ENCOUNTER — Ambulatory Visit: Payer: Medicare Other

## 2021-12-19 ENCOUNTER — Encounter: Payer: Self-pay | Admitting: Cardiovascular Disease

## 2021-12-19 ENCOUNTER — Ambulatory Visit: Payer: Medicare Other | Admitting: Cardiovascular Disease

## 2021-12-19 VITALS — BP 110/70 | HR 54 | Ht 62.0 in | Wt 159.0 lb

## 2021-12-19 DIAGNOSIS — R002 Palpitations: Secondary | ICD-10-CM

## 2021-12-19 NOTE — Patient Instructions (Signed)
Medication Instructions:  Your physician recommends that you continue on your current medications as directed. Please refer to the Current Medication list given to you today.  *If you need a refill on your cardiac medications before your next appointment, please call your pharmacy*  Lab Work: If you have labs (blood work) drawn today and your tests are completely normal, you will receive your results only by: [place] (if you have MyChart) OR A paper copy in the mail If you have any lab test that is abnormal or we need to change your treatment, we will call you to review the results.  Follow-Up: At [facility], you and your health needs are our priority.  As part of our continuing mission to provide you with exceptional heart care, we have created designated Provider Care Teams.  These Care Teams include your primary Cardiologist (physician) and Advanced Practice Providers (APPs -  Physician Assistants and Nurse Practitioners) who all work together to provide you with the care you need, when you need it.  We recommend signing up for the patient portal called "MyChart".  Sign up information is provided on this After Visit Summary.  MyChart is used to connect with patients for Virtual Visits (Telemedicine).  Patients are able to view lab/test results, encounter notes, upcoming appointments, etc.  Non-urgent messages can be sent to your provider as well.   To learn more about what you can do with MyChart, go to [email or web address].    Your next appointment:   As needed

## 2021-12-23 ENCOUNTER — Other Ambulatory Visit: Payer: Self-pay | Admitting: Family Medicine

## 2021-12-28 ENCOUNTER — Ambulatory Visit
Admission: RE | Admit: 2021-12-28 | Discharge: 2021-12-28 | Disposition: A | Discharge status: Home / Self Care | Payer: Medicare Other | Source: Ambulatory Visit | Attending: Family Medicine | Admitting: Family Medicine

## 2021-12-28 DIAGNOSIS — Z1231 Encounter for screening mammogram for malignant neoplasm of breast: Secondary | ICD-10-CM | POA: Diagnosis not present

## 2022-01-02 ENCOUNTER — Ambulatory Visit: Payer: Medicare Other | Admitting: Physician Assistant

## 2022-01-06 ENCOUNTER — Other Ambulatory Visit: Payer: Self-pay | Admitting: Family Medicine

## 2022-01-17 ENCOUNTER — Other Ambulatory Visit: Payer: Self-pay | Admitting: Family Medicine

## 2022-01-18 ENCOUNTER — Other Ambulatory Visit: Payer: Self-pay | Admitting: Family Medicine

## 2022-02-16 ENCOUNTER — Other Ambulatory Visit: Payer: Self-pay | Admitting: Family Medicine

## 2022-02-18 DIAGNOSIS — X32XXXA Exposure to sunlight, initial encounter: Secondary | ICD-10-CM | POA: Diagnosis not present

## 2022-02-18 DIAGNOSIS — Z1283 Encounter for screening for malignant neoplasm of skin: Secondary | ICD-10-CM | POA: Diagnosis not present

## 2022-02-18 DIAGNOSIS — Z08 Encounter for follow-up examination after completed treatment for malignant neoplasm: Secondary | ICD-10-CM | POA: Diagnosis not present

## 2022-02-18 DIAGNOSIS — C44519 Basal cell carcinoma of skin of other part of trunk: Secondary | ICD-10-CM | POA: Diagnosis not present

## 2022-02-18 DIAGNOSIS — Z85820 Personal history of malignant melanoma of skin: Secondary | ICD-10-CM | POA: Diagnosis not present

## 2022-02-18 DIAGNOSIS — D225 Melanocytic nevi of trunk: Secondary | ICD-10-CM | POA: Diagnosis not present

## 2022-02-18 DIAGNOSIS — L570 Actinic keratosis: Secondary | ICD-10-CM | POA: Diagnosis not present

## 2022-03-10 ENCOUNTER — Other Ambulatory Visit: Payer: Self-pay | Admitting: Family Medicine

## 2022-03-24 ENCOUNTER — Other Ambulatory Visit: Payer: Self-pay | Admitting: Family Medicine

## 2022-03-26 NOTE — Telephone Encounter (Signed)
Per pharmacy,  patient is requesting 1 tablet per day as oppose to 1/2 tabs per day.    Please advise if okay to change to 1 tab per day.

## 2022-03-27 ENCOUNTER — Other Ambulatory Visit: Payer: Self-pay

## 2022-03-27 DIAGNOSIS — F339 Major depressive disorder, recurrent, unspecified: Secondary | ICD-10-CM

## 2022-03-27 MED ORDER — ESCITALOPRAM OXALATE 10 MG PO TABS: 10.0000 mg | ORAL_TABLET | Freq: Every day | ORAL | 1 refills | Status: DC

## 2022-04-17 ENCOUNTER — Other Ambulatory Visit: Payer: Self-pay | Admitting: Family Medicine

## 2022-05-05 ENCOUNTER — Other Ambulatory Visit: Payer: Self-pay | Admitting: Family Medicine

## 2022-05-08 DIAGNOSIS — R350 Frequency of micturition: Secondary | ICD-10-CM | POA: Diagnosis not present

## 2022-05-08 DIAGNOSIS — N3020 Other chronic cystitis without hematuria: Secondary | ICD-10-CM | POA: Diagnosis not present

## 2022-06-24 DIAGNOSIS — B070 Plantar wart: Secondary | ICD-10-CM | POA: Diagnosis not present

## 2022-06-24 DIAGNOSIS — Z85828 Personal history of other malignant neoplasm of skin: Secondary | ICD-10-CM | POA: Diagnosis not present

## 2022-06-24 DIAGNOSIS — L821 Other seborrheic keratosis: Secondary | ICD-10-CM | POA: Diagnosis not present

## 2022-06-24 DIAGNOSIS — Z08 Encounter for follow-up examination after completed treatment for malignant neoplasm: Secondary | ICD-10-CM | POA: Diagnosis not present

## 2022-06-24 DIAGNOSIS — Z85820 Personal history of malignant melanoma of skin: Secondary | ICD-10-CM | POA: Diagnosis not present

## 2022-06-24 DIAGNOSIS — B078 Other viral warts: Secondary | ICD-10-CM | POA: Diagnosis not present

## 2022-06-24 DIAGNOSIS — L820 Inflamed seborrheic keratosis: Secondary | ICD-10-CM | POA: Diagnosis not present

## 2022-06-24 DIAGNOSIS — D225 Melanocytic nevi of trunk: Secondary | ICD-10-CM | POA: Diagnosis not present

## 2022-06-24 DIAGNOSIS — Z1283 Encounter for screening for malignant neoplasm of skin: Secondary | ICD-10-CM | POA: Diagnosis not present

## 2022-06-30 ENCOUNTER — Other Ambulatory Visit: Payer: Self-pay | Admitting: Family Medicine

## 2022-06-30 DIAGNOSIS — F339 Major depressive disorder, recurrent, unspecified: Secondary | ICD-10-CM

## 2022-07-12 ENCOUNTER — Ambulatory Visit (INDEPENDENT_AMBULATORY_CARE_PROVIDER_SITE_OTHER): Payer: Medicare Other | Admitting: Family Medicine

## 2022-07-12 ENCOUNTER — Encounter: Payer: Self-pay | Admitting: Family Medicine

## 2022-07-12 VITALS — BP 144/70 | HR 53 | Temp 98.1°F | Ht 60.63 in | Wt 159.5 lb

## 2022-07-12 DIAGNOSIS — C439 Malignant melanoma of skin, unspecified: Secondary | ICD-10-CM | POA: Diagnosis not present

## 2022-07-12 DIAGNOSIS — R5383 Other fatigue: Secondary | ICD-10-CM

## 2022-07-12 DIAGNOSIS — I10 Essential (primary) hypertension: Secondary | ICD-10-CM | POA: Diagnosis not present

## 2022-07-12 DIAGNOSIS — Z0000 Encounter for general adult medical examination without abnormal findings: Secondary | ICD-10-CM | POA: Diagnosis not present

## 2022-07-12 DIAGNOSIS — E7800 Pure hypercholesterolemia, unspecified: Secondary | ICD-10-CM

## 2022-07-12 LAB — CBC WITH DIFFERENTIAL/PLATELET
Basophils Absolute: 0.1 10*3/uL (ref 0.0–0.1)
Basophils Relative: 0.8 % (ref 0.0–3.0)
Eosinophils Absolute: 0.2 10*3/uL (ref 0.0–0.7)
Eosinophils Relative: 3.1 % (ref 0.0–5.0)
HCT: 40.1 % (ref 36.0–46.0)
Hemoglobin: 13.9 g/dL (ref 12.0–15.0)
Lymphocytes Relative: 28.8 % (ref 12.0–46.0)
Lymphs Abs: 1.9 10*3/uL (ref 0.7–4.0)
MCHC: 34.6 g/dL (ref 30.0–36.0)
MCV: 92.7 fl (ref 78.0–100.0)
Monocytes Absolute: 0.5 10*3/uL (ref 0.1–1.0)
Monocytes Relative: 7.9 % (ref 3.0–12.0)
Neutro Abs: 3.9 10*3/uL (ref 1.4–7.7)
Neutrophils Relative %: 59.4 % (ref 43.0–77.0)
Platelets: 253 10*3/uL (ref 150.0–400.0)
RBC: 4.33 Mil/uL (ref 3.87–5.11)
RDW: 13 % (ref 11.5–15.5)
WBC: 6.6 10*3/uL (ref 4.0–10.5)

## 2022-07-12 LAB — LIPID PANEL
Cholesterol: 134 mg/dL (ref 0–200)
HDL: 47.7 mg/dL (ref >39.00)
LDL Cholesterol: 70 mg/dL (ref 0–99)
NonHDL: 86.48
Total CHOL/HDL Ratio: 3
Triglycerides: 84 mg/dL (ref 0.0–149.0)
VLDL: 16.8 mg/dL (ref 0.0–40.0)

## 2022-07-12 LAB — COMPREHENSIVE METABOLIC PANEL
ALT: 11 U/L (ref 0–35)
AST: 17 U/L (ref 0–37)
Albumin: 3.8 g/dL (ref 3.5–5.2)
Alkaline Phosphatase: 53 U/L (ref 39–117)
BUN: 15 mg/dL (ref 6–23)
CO2: 27 mEq/L (ref 19–32)
Calcium: 9.9 mg/dL (ref 8.4–10.5)
Chloride: 102 mEq/L (ref 96–112)
Creatinine, Ser: 0.71 mg/dL (ref 0.40–1.20)
GFR: 78.75 mL/min (ref >60.00)
Glucose, Bld: 92 mg/dL (ref 70–99)
Potassium: 4.1 mEq/L (ref 3.5–5.1)
Sodium: 138 mEq/L (ref 135–145)
Total Bilirubin: 0.6 mg/dL (ref 0.2–1.2)
Total Protein: 6.7 g/dL (ref 6.0–8.3)

## 2022-07-12 LAB — TSH: TSH: 1.27 u[IU]/mL (ref 0.35–5.50)

## 2022-07-12 NOTE — Progress Notes (Signed)
Established Patient Office Visit  Subjective   Patient ID: Wendy Sandoval, female    DOB: 1940/07/28  Age: 82 y.o. MRN: [id number]  Chief Complaint  Patient presents with   Annual Exam    HPI   Ms. Andrea is here for physical exam.  She has history of hypertension, obesity, fatty liver changes, GERD, melanoma of the skin which we diagnosed few months ago, history of recurrent depression, hyperlipidemia.  She is generally doing well.  No recent falls.  She feels like her depression symptoms are stable.  Medications reviewed.  Compliant with all.  She still getting mammograms.  Health maintenance reviewed:  -Never got flu vaccine this year which is atypical for her.  She plans to get 1 next fall -Pneumonia vaccine complete -She plans to get repeat mammogram around August -Had previous Zostavax but no history of Shingrix. -Aged out of further colonoscopies or Pap smears.  Family history-she was an only child.  Her father died of stroke complications age 35.  Mother 50 with history of coronary artery disease and hypertension.  Social history-she has been widowed for many years.  Her husband died of pancreatic cancer.  She has 2 children and has couple grandchildren and a great grandchild.  Never smoked.  No alcohol.  Past Medical History:  Diagnosis Date   Basal cell carcinoma 04/23/2011   upper left cheek (txpbx)   Chronic UTI (urinary tract infection)    recurrent   Clark level II melanoma ([place]) 10/09/2021   Left Elbow Post (excision)   Depression    DIVERTICULOSIS, COLON 03/17/2007   DJD (degenerative joint disease)    Esophageal stricture    Fatty liver    GERD 08/09/2008   Headache(784.0)    Hiatal hernia    HYPERCHOLESTEROLEMIA 07/07/2007   HYPERTENSION 07/07/2007   IBS (irritable bowel syndrome)    SCC (squamous cell carcinoma) 08/18/2014   right forearm (txpbx) cautery    Schatzki's ring    Squamous cell carcinoma of skin 11/29/2010   left inner shin  (cx63f) KA   TMJ syndrome    Past Surgical History:  Procedure Laterality Date   ABDOMINAL HYSTERECTOMY  1998   APPENDECTOMY     BREAST EXCISIONAL BIOPSY Left    Benign    BREAST SURGERY     milk gland   LUNG SURGERY  1983    reports that she has never smoked. She has never used smokeless tobacco. She reports that she does not drink alcohol and does not use drugs. family history includes Alcohol abuse in an other family member; Arthritis in her mother and another family member; Diabetes in an other family member; Heart disease in her mother and another family member; Hyperlipidemia in an other family member; Hypertension in an other family member; Stroke in her father and another family member; Thyroid disease in her father. Allergies  Allergen Reactions   Codeine Sulfate     REACTION: nausea   Histamine     Anxious feeling     Review of Systems  Constitutional:  Negative for chills, fever, malaise/fatigue and weight loss.  HENT:  Negative for hearing loss.   Eyes:  Negative for blurred vision and double vision.  Respiratory:  Negative for cough and shortness of breath.   Cardiovascular:  Negative for chest pain, palpitations and leg swelling.  Gastrointestinal:  Negative for abdominal pain, blood in stool, constipation and diarrhea.  Genitourinary:  Negative for dysuria.  Musculoskeletal:  Positive for joint pain.  Skin:  Negative for rash.  Neurological:  Negative for dizziness, speech change, seizures, loss of consciousness and headaches.  Psychiatric/Behavioral:  Negative for depression.       Objective:     BP (!) 144/70 (BP Location: Left Arm, Patient Position: Sitting, Cuff Size: Normal)   Pulse (!) 53   Temp 98.1 F (36.7 C) (Oral)   Ht 5' 0.63" (1.54 m)   Wt 159 lb 8 oz (72.3 kg)   SpO2 98%   BMI 30.51 kg/m  BP Readings from Last 3 Encounters:  07/12/22 (!) 144/70  12/19/21 110/70  10/26/21 128/60   Wt Readings from Last 3 Encounters:  07/12/22 159  lb 8 oz (72.3 kg)  12/19/21 159 lb (72.1 kg)  11/09/21 160 lb (72.6 kg)      Physical Exam Constitutional:      General: She is not in acute distress.    Appearance: She is well-developed. She is not ill-appearing.  HENT:     Head: Normocephalic and atraumatic.  Eyes:     Pupils: Pupils are equal, round, and reactive to light.  Neck:     Thyroid: No thyromegaly.  Cardiovascular:     Rate and Rhythm: Normal rate and regular rhythm.     Heart sounds: Normal heart sounds. No murmur heard. Pulmonary:     Effort: No respiratory distress.     Breath sounds: Normal breath sounds. No wheezing or rales.  Abdominal:     General: Bowel sounds are normal. There is no distension.     Palpations: Abdomen is soft. There is no mass.     Tenderness: There is no abdominal tenderness. There is no guarding or rebound.  Musculoskeletal:        General: Normal range of motion.     Cervical back: Normal range of motion and neck supple.     Right lower leg: No edema.     Left lower leg: No edema.  Lymphadenopathy:     Cervical: No cervical adenopathy.  Skin:    Findings: No rash.  Neurological:     Mental Status: She is alert and oriented to person, place, and time.     Cranial Nerves: No cranial nerve deficit.  Psychiatric:        Behavior: Behavior normal.        Thought Content: Thought content normal.        Judgment: Judgment normal.      No results found for any visits on 07/12/22.    The ASCVD Risk score (Arnett DK, et al., 2019) failed to calculate for the following reasons:   The 2019 ASCVD risk score is only valid for ages 4 to 50    Assessment & Plan:   Physical exam.  Chronic problems as above stable.  We discussed the following health maintenance items  -She plans to continue with yearly mammogram -Aged out of colon cancer screening and Pap smears -Recommend staying as active as possible and discussed fall prevention -Check lab work including CBC, CMP, TSH, lipid  panel -She had been taking aspirin occasionally and we recommended against that since she has no history of stroke or heart disease. -Recommend flu vaccine next fall -We have recommended that she consider Shingrix vaccine and she will check on cost at pharmacy. -Her systolic blood pressure was up slightly today and we recommended that she try to lose some weight and monitor closely at home.  If home readings staying consistently over 140 be in touch and consider further titration  of medications.   No follow-ups on file.    Carolann Littler, MD

## 2022-07-14 ENCOUNTER — Other Ambulatory Visit: Payer: Self-pay | Admitting: Family Medicine

## 2022-07-16 ENCOUNTER — Telehealth: Payer: Self-pay | Admitting: Family Medicine

## 2022-07-16 NOTE — Telephone Encounter (Signed)
Pt is calling and would like blood work result 

## 2022-07-16 NOTE — Telephone Encounter (Signed)
Please see result note 

## 2022-09-28 ENCOUNTER — Emergency Department (HOSPITAL_BASED_OUTPATIENT_CLINIC_OR_DEPARTMENT_OTHER)
Admission: EM | Admit: 2022-09-28 | Discharge: 2022-09-28 | Disposition: A | Discharge status: Home / Self Care | Payer: Medicare Other | Attending: Emergency Medicine | Admitting: Emergency Medicine

## 2022-09-28 ENCOUNTER — Emergency Department (HOSPITAL_BASED_OUTPATIENT_CLINIC_OR_DEPARTMENT_OTHER): Payer: Medicare Other

## 2022-09-28 ENCOUNTER — Encounter (HOSPITAL_BASED_OUTPATIENT_CLINIC_OR_DEPARTMENT_OTHER): Payer: Self-pay | Admitting: Emergency Medicine

## 2022-09-28 ENCOUNTER — Emergency Department (HOSPITAL_BASED_OUTPATIENT_CLINIC_OR_DEPARTMENT_OTHER): Payer: Medicare Other | Admitting: Radiology

## 2022-09-28 ENCOUNTER — Other Ambulatory Visit: Payer: Self-pay

## 2022-09-28 DIAGNOSIS — I6782 Cerebral ischemia: Secondary | ICD-10-CM | POA: Insufficient documentation

## 2022-09-28 DIAGNOSIS — W19XXXD Unspecified fall, subsequent encounter: Secondary | ICD-10-CM

## 2022-09-28 DIAGNOSIS — Z79899 Other long term (current) drug therapy: Secondary | ICD-10-CM | POA: Diagnosis not present

## 2022-09-28 DIAGNOSIS — K029 Dental caries, unspecified: Secondary | ICD-10-CM | POA: Diagnosis not present

## 2022-09-28 DIAGNOSIS — S2231XD Fracture of one rib, right side, subsequent encounter for fracture with routine healing: Secondary | ICD-10-CM | POA: Diagnosis not present

## 2022-09-28 DIAGNOSIS — R079 Chest pain, unspecified: Secondary | ICD-10-CM | POA: Diagnosis not present

## 2022-09-28 DIAGNOSIS — R1031 Right lower quadrant pain: Secondary | ICD-10-CM | POA: Diagnosis present

## 2022-09-28 DIAGNOSIS — S2231XA Fracture of one rib, right side, initial encounter for closed fracture: Secondary | ICD-10-CM | POA: Diagnosis not present

## 2022-09-28 DIAGNOSIS — I10 Essential (primary) hypertension: Secondary | ICD-10-CM | POA: Insufficient documentation

## 2022-09-28 DIAGNOSIS — Z043 Encounter for examination and observation following other accident: Secondary | ICD-10-CM | POA: Diagnosis not present

## 2022-09-28 DIAGNOSIS — N390 Urinary tract infection, site not specified: Secondary | ICD-10-CM | POA: Diagnosis not present

## 2022-09-28 DIAGNOSIS — S0081XA Abrasion of other part of head, initial encounter: Secondary | ICD-10-CM | POA: Diagnosis not present

## 2022-09-28 DIAGNOSIS — R519 Headache, unspecified: Secondary | ICD-10-CM | POA: Diagnosis not present

## 2022-09-28 DIAGNOSIS — Z23 Encounter for immunization: Secondary | ICD-10-CM | POA: Diagnosis not present

## 2022-09-28 DIAGNOSIS — W01198A Fall on same level from slipping, tripping and stumbling with subsequent striking against other object, initial encounter: Secondary | ICD-10-CM | POA: Insufficient documentation

## 2022-09-28 DIAGNOSIS — R0789 Other chest pain: Secondary | ICD-10-CM | POA: Diagnosis not present

## 2022-09-28 LAB — URINALYSIS, ROUTINE W REFLEX MICROSCOPIC
Bilirubin Urine: NEGATIVE
Glucose, UA: NEGATIVE mg/dL
Hgb urine dipstick: NEGATIVE
Ketones, ur: NEGATIVE mg/dL
Nitrite: POSITIVE — AB
Protein, ur: NEGATIVE mg/dL
Specific Gravity, Urine: 1.008 (ref 1.005–1.030)
pH: 6.5 (ref 5.0–8.0)

## 2022-09-28 LAB — COMPREHENSIVE METABOLIC PANEL
ALT: 9 U/L (ref 0–44)
AST: 13 U/L — ABNORMAL LOW (ref 15–41)
Albumin: 4 g/dL (ref 3.5–5.0)
Alkaline Phosphatase: 54 U/L (ref 38–126)
Anion gap: 8 (ref 5–15)
BUN: 12 mg/dL (ref 8–23)
CO2: 28 mmol/L (ref 22–32)
Calcium: 9.3 mg/dL (ref 8.9–10.3)
Chloride: 104 mmol/L (ref 98–111)
Creatinine, Ser: 0.54 mg/dL (ref 0.44–1.00)
GFR, Estimated: >60 mL/min (ref >60)
Glucose, Bld: 90 mg/dL (ref 70–99)
Potassium: 3.5 mmol/L (ref 3.5–5.1)
Sodium: 140 mmol/L (ref 135–145)
Total Bilirubin: 0.6 mg/dL (ref 0.3–1.2)
Total Protein: 6.9 g/dL (ref 6.5–8.1)

## 2022-09-28 LAB — CBC WITH DIFFERENTIAL/PLATELET
Abs Immature Granulocytes: 0.04 10*3/uL (ref 0.00–0.07)
Basophils Absolute: 0 10*3/uL (ref 0.0–0.1)
Basophils Relative: 1 %
Eosinophils Absolute: 0.1 10*3/uL (ref 0.0–0.5)
Eosinophils Relative: 2 %
HCT: 38.9 % (ref 36.0–46.0)
Hemoglobin: 13.7 g/dL (ref 12.0–15.0)
Immature Granulocytes: 1 %
Lymphocytes Relative: 29 %
Lymphs Abs: 1.9 10*3/uL (ref 0.7–4.0)
MCH: 31.6 pg (ref 26.0–34.0)
MCHC: 35.2 g/dL (ref 30.0–36.0)
MCV: 89.8 fL (ref 80.0–100.0)
Monocytes Absolute: 0.6 10*3/uL (ref 0.1–1.0)
Monocytes Relative: 8 %
Neutro Abs: 4 10*3/uL (ref 1.7–7.7)
Neutrophils Relative %: 59 %
Platelets: 213 10*3/uL (ref 150–400)
RBC: 4.33 MIL/uL (ref 3.87–5.11)
RDW: 12.4 % (ref 11.5–15.5)
WBC: 6.7 10*3/uL (ref 4.0–10.5)
nRBC: 0 % (ref 0.0–0.2)

## 2022-09-28 LAB — LIPASE, BLOOD: Lipase: 12 U/L (ref 11–51)

## 2022-09-28 LAB — PROTIME-INR
INR: 1 (ref 0.8–1.2)
Prothrombin Time: 13.7 seconds (ref 11.4–15.2)

## 2022-09-28 LAB — APTT: aPTT: 29 seconds (ref 24–36)

## 2022-09-28 MED ORDER — LIDOCAINE 5 % EX PTCH: 1.0000 | MEDICATED_PATCH | CUTANEOUS | 0 refills | Status: AC

## 2022-09-28 MED ORDER — CEPHALEXIN 500 MG PO CAPS: 500.0000 mg | ORAL_CAPSULE | Freq: Two times a day (BID) | ORAL | 0 refills | Status: AC

## 2022-09-28 MED ORDER — TRAMADOL HCL 50 MG PO TABS: 50.0000 mg | ORAL_TABLET | Freq: Four times a day (QID) | ORAL | 0 refills | Status: DC | PRN

## 2022-09-28 MED ORDER — ONDANSETRON HCL 4 MG/2ML IJ SOLN
4.0000 mg | Freq: Once | INTRAMUSCULAR | Status: DC
  Filled 2022-09-28: qty 2

## 2022-09-28 MED ORDER — TRAMADOL HCL 50 MG PO TABS
50.0000 mg | ORAL_TABLET | Freq: Once | ORAL | Status: AC
  Administered 2022-09-28: 50 mg via ORAL
  Filled 2022-09-28: qty 1

## 2022-09-28 MED ORDER — TETANUS-DIPHTH-ACELL PERTUSSIS 5-2.5-18.5 LF-MCG/0.5 IM SUSY
0.5000 mL | PREFILLED_SYRINGE | Freq: Once | INTRAMUSCULAR | Status: AC
  Administered 2022-09-28: 0.5 mL via INTRAMUSCULAR
  Filled 2022-09-28: qty 0.5

## 2022-09-28 MED ORDER — MORPHINE SULFATE (PF) 2 MG/ML IV SOLN
2.0000 mg | Freq: Once | INTRAVENOUS | Status: DC
  Filled 2022-09-28: qty 1

## 2022-09-28 MED ORDER — SODIUM CHLORIDE 0.9 % IV SOLN
1.0000 g | Freq: Once | INTRAVENOUS | Status: AC
  Administered 2022-09-28: 1 g via INTRAVENOUS
  Filled 2022-09-28: qty 10

## 2022-09-28 MED ORDER — IOHEXOL 300 MG/ML  SOLN: 100.0000 mL | Freq: Once | INTRAMUSCULAR | Status: DC | PRN

## 2022-09-28 MED ORDER — LIDOCAINE 5 % EX PTCH
2.0000 | MEDICATED_PATCH | CUTANEOUS | Status: DC
  Administered 2022-09-28: 2 via TRANSDERMAL
  Filled 2022-09-28: qty 2

## 2022-09-28 MED ORDER — ACETAMINOPHEN 500 MG PO TABS
1000.0000 mg | ORAL_TABLET | Freq: Once | ORAL | Status: AC
  Administered 2022-09-28: 1000 mg via ORAL
  Filled 2022-09-28: qty 2

## 2022-09-28 MED ORDER — ONDANSETRON 4 MG PO TBDP: 4.0000 mg | ORAL_TABLET | Freq: Three times a day (TID) | ORAL | 0 refills | Status: DC | PRN

## 2022-09-28 NOTE — ED Notes (Signed)
   09/28/22 1704  Incentive Spirometry Tx  Level of Service Assisted by RCP  Frequency q1hr W/A  Treatment Tolerance Tolerated well  IS Goal (mL) (RN or RT) 1500 mL  IS - Achieved (mL) (RN, NT, or RT) 1500 mL

## 2022-09-28 NOTE — ED Triage Notes (Addendum)
Pt presents to ED POV. Pt c/o R side pain, R leg pain, and abrasion to R side of face s/p mechanical fall on Thursday. Pt denies blood thinners, denies LOC.  Pt reports not taking bp meds this morning.

## 2022-09-28 NOTE — ED Provider Notes (Signed)
  Physical Exam  BP (!) 144/65   Pulse 60   Temp 97.8 F (36.6 C) (Oral)   Resp 18   SpO2 95%   Physical Exam  Procedures  Procedures  ED Course / MDM    Medical Decision Making Amount and/or Complexity of Data Reviewed Labs: ordered. Radiology: ordered.  Risk OTC drugs. Prescription drug management.    Patient signed out with CT pending for evaluation for fractures.  CT is negative for fracture.  The patient appears reasonably screened and/or stabilized for discharge and I doubt any other medical condition or other [facility] requiring further screening, evaluation, or treatment in the ED at this time prior to discharge.   Results from the ER workup discussed with the patient face to face and all questions answered to the best of my ability. The patient is safe for discharge with strict return precautions.        Derwood Kaplan, MD 09/28/22 [phone]

## 2022-09-28 NOTE — ED Provider Notes (Signed)
[place] EMERGENCY DEPARTMENT AT [facility] Provider Note   CSN: [id number] Arrival date & time: 09/28/22  4098     History  Chief Complaint  Patient presents with   Marletta Lor    Wendy Sandoval is a 82 y.o. female.  With PMH of HTN, HLD, degenerative joint disease not on anticoagulation who presents with ongoing right side pain after falling 3 days prior.  Fall was mechanical.  Patient says she was walking in her driveway when she felt her right leg gave out on her and she fell to her right side landing onto her right head and chest wall.  She is complaining mainly of pain in the right side of her chest wall worse with breathing and movement.  Over-the-counter medications are not helping.  She has had no fevers, no chills, no coughing or shortness of breath.  She denies any headache or vomiting or confusion.  She denied any preceding symptoms leading to the fall.   Fall       Home Medications Prior to Admission medications   Medication Sig Start Date End Date Taking? Authorizing Provider  cephALEXin (KEFLEX) 500 MG capsule Take 1 capsule (500 mg total) by mouth 2 (two) times daily for 7 days. 09/28/22 10/05/22 Yes Mardene Sayer, MD  lidocaine (LIDODERM) 5 % Place 1 patch onto the skin daily. Remove & Discard patch within 12 hours or as directed by MD 09/28/22  Yes Mardene Sayer, MD  ondansetron (ZOFRAN-ODT) 4 MG disintegrating tablet Take 1 tablet (4 mg total) by mouth every 8 (eight) hours as needed. 09/28/22  Yes Mardene Sayer, MD  traMADol (ULTRAM) 50 MG tablet Take 1 tablet (50 mg total) by mouth every 6 (six) hours as needed for up to 10 doses for severe pain. 09/28/22  Yes Mardene Sayer, MD  amLODipine (NORVASC) 5 MG tablet TAKE 1 TABLET BY MOUTH EVERY DAY 01/17/22   Burchette, Elberta Fortis, MD  cholecalciferol (VITAMIN D) 1000 UNITS tablet Take 1,000 Units by mouth 2 (two) times daily.     [provider]  escitalopram (LEXAPRO) 10 MG tablet TAKE  1 TABLET BY MOUTH EVERY DAY 07/01/22   Burchette, Elberta Fortis, MD  esomeprazole (NEXIUM) 20 MG capsule Take 20 mg by mouth daily at 12 noon.    [provider]  hydrochlorothiazide (HYDRODIURIL) 25 MG tablet TAKE 1 TABLET (25 MG TOTAL) BY MOUTH DAILY. *YEARLY APPOINTMENT REQUIRED FOR FUTURE REFILLS 07/15/22   Burchette, Elberta Fortis, MD  losartan (COZAAR) 50 MG tablet TAKE 1 TABLET BY MOUTH EVERY DAY 05/06/22   Burchette, Elberta Fortis, MD  metoprolol tartrate (LOPRESSOR) 50 MG tablet TAKE 1/2 TABLET (25 MG TOTAL) BY MOUTH 2 (TWO) TIMES DAILY. 01/08/22   Burchette, Elberta Fortis, MD  mupirocin ointment (BACTROBAN) 2 % Apply 1 Application topically daily. 11/21/21   Janalyn Harder, MD  simvastatin (ZOCOR) 40 MG tablet TAKE 1 TABLET BY MOUTH EVERYDAY AT BEDTIME 03/12/22   Burchette, Elberta Fortis, MD  triamcinolone cream (KENALOG) 0.1 % Apply to leg nightly to as needed 01/02/21   Mackey Birchwood R, PA-C      Allergies    Codeine sulfate and Histamine    Review of Systems   Review of Systems  Physical Exam Updated Vital Signs BP (!) 144/65   Pulse 60   Temp 97.8 F (36.6 C) (Oral)   Resp 18   SpO2 95%  Physical Exam Constitutional: Alert and oriented. Uncomfortable but nontoxic Eyes: Conjunctivae are normal. ENT  Head: Normocephalic and 2 cm healing hemostatic superficial laceration lateral to right eye with associated inferior and right maxillary edema and minimal eccymoses. No proptosis. EOMI. PERRL/.      Neck: No midline ttp, stepoff or deformity of C/T/L spine. Cardiovascular: S1, S2,  Normal and symmetric distal pulses are present in all extremities.Warm and well perfused. Respiratory: mild splinting, clear BS bilateral, O2 >95 on RA; right chest wall ttp without external evidence of trauma Gastrointestinal: Soft and moderate [id number], no rebound or guarding or external evidence of trauma Musculoskeletal: Normal range of motion in all extremities.      Right lower leg: No tenderness or edema.       Left lower leg: No tenderness or edema. Neurologic: Normal speech and language. No gross focal neurologic deficits are appreciated. Skin: Skin is warm, dry and intact. No rash noted. Psychiatric: Mood and affect are normal. Speech and behavior are normal.  ED Results / Procedures / Treatments   Labs (all labs ordered are listed, but only abnormal results are displayed) Labs Reviewed  COMPREHENSIVE METABOLIC PANEL - Abnormal; Notable for the following components:      Result Value   AST 13 (*)    All other components within normal limits  URINALYSIS, ROUTINE W REFLEX MICROSCOPIC - Abnormal; Notable for the following components:   Color, Urine COLORLESS (*)    Nitrite POSITIVE (*)    Leukocytes,Ua SMALL (*)    Bacteria, UA FEW (*)    All other components within normal limits  CBC WITH DIFFERENTIAL/PLATELET  LIPASE, BLOOD  PROTIME-INR  APTT    EKG EKG Interpretation  Date/Time:  Saturday Sep 28 2022 13:10:20 EDT Ventricular Rate:  60 PR Interval:  159 QRS Duration: 95 QT Interval:  429 QTC Calculation: 429 R Axis:   47 Text Interpretation: Sinus rhythm Confirmed by Vivien Rossetti ([id number]) on 09/28/2022 2:33:00 PM  Radiology CT CHEST ABDOMEN PELVIS WO CONTRAST  Result Date: 09/28/2022 CLINICAL DATA:  Status post fall.  Complains of right side pain. EXAM: CT CHEST, ABDOMEN AND PELVIS WITHOUT CONTRAST TECHNIQUE: Multidetector CT imaging of the chest, abdomen and pelvis was performed following the standard protocol without IV contrast. RADIATION DOSE REDUCTION: This exam was performed according to the departmental dose-optimization program which includes automated exposure control, adjustment of the mA and/or kV according to patient size and/or use of iterative reconstruction technique. COMPARISON:  CT AP from 01/26/2015 the FINDINGS: CT CHEST FINDINGS Cardiovascular: Normal heart size. Aortic atherosclerosis and coronary artery calcifications. No pericardial effusion identified.  Mediastinum/Nodes: No enlarged mediastinal, hilar, or axillary lymph nodes. Thyroid gland, trachea, and esophagus demonstrate no significant findings. Lungs/Pleura: No pleural effusion, airspace consolidation, or pneumothorax. No signs of pulmonary contusion. Mild bilateral lung base scarring. Small nodule in the periphery of the right upper lobe measures 3 mm, image 36/4. Postsurgical change noted within the left midlung. Musculoskeletal: No acute rib fractures identified. No CT correlate to the plain film radiograph abnormality noted earlier today. Spondylosis noted within the thoracic spine. CT ABDOMEN PELVIS FINDINGS Hepatobiliary: Low-density structure in the lateral aspect of the left lobe of liver measures 0.9 cm technically too small to characterize but is unchanged from previous exam. No suspicious liver lesion. Gallbladder is normal. No bile duct dilatation. Pancreas: Unremarkable. No pancreatic ductal dilatation or surrounding inflammatory changes. Spleen: Normal in size without focal abnormality. Adrenals/Urinary Tract: Normal adrenal glands. Small left kidney cyst measures 1 cm. No follow-up imaging recommended. No nephrolithiasis or hydronephrosis. Urinary bladder is unremarkable. Stomach/Bowel: Stomach  is normal. Status post appendectomy. Sigmoid diverticulosis without signs of acute diverticulitis. No bowel wall thickening, inflammation or distension. Vascular/Lymphatic: Aortic atherosclerosis. No enlarged abdominal or pelvic lymph nodes. Reproductive: Status post hysterectomy. No adnexal masses. Other: No free fluid or fluid collections. No signs of pneumoperitoneum. Musculoskeletal: No acute osseous abnormalities.5 mm anterolisthesis of L3 on L4 and 6 mm anterolisthesis of L5 on S1. Multilevel thoracolumbar degenerative disc disease noted. IMPRESSION: 1. No acute findings within the chest, abdomen or pelvis. 2. No convincing evidence for rib fracture. 3. 3 mm right upper lobe lung nodule. Per  Fleischner Society Guidelines, if patient is low risk for malignancy, no routine follow-up imaging is recommended. If patient is high risk for malignancy, a non-contrast Chest CT at 12 months is optional. If performed and the nodule is stable at 12 months, no further follow-up is recommended. 4.  Aortic Atherosclerosis (ICD10-I70.0). Electronically Signed   By: Signa Kell M.D.   On: 09/28/2022 16:28   CT Cervical Spine Wo Contrast  Result Date: 09/28/2022 CLINICAL DATA:  fall EXAM: CT CERVICAL SPINE WITHOUT CONTRAST TECHNIQUE: Multidetector CT imaging of the cervical spine was performed without intravenous contrast. Multiplanar CT image reconstructions were also generated. RADIATION DOSE REDUCTION: This exam was performed according to the departmental dose-optimization program which includes automated exposure control, adjustment of the mA and/or kV according to patient size and/or use of iterative reconstruction technique. COMPARISON:  None Available. FINDINGS: Alignment: No traumatic subluxation. Degenerative grade 1 retrolisthesis of C3 on C4 and anterolisthesis of C4 on C5 and C7 on T1. Skull base and vertebrae: No acute fracture. Vertebral body heights are maintained. The dens and skull base are intact. Pannus formation at C1-C2. Soft tissues and spinal canal: No prevertebral fluid or swelling. No visible canal hematoma. Disc levels: Moderate diffuse degenerative disc disease from C3-C4 through C7-T1. Moderate multilevel facet hypertrophy. Upper chest: Assessed on concurrent chest CT, reported separately. Other: None. IMPRESSION: Moderate diffuse degenerative disc disease and facet hypertrophy throughout the cervical spine without acute fracture or subluxation. Electronically Signed   By: Narda Rutherford M.D.   On: 09/28/2022 16:09   CT Maxillofacial Wo Contrast  Result Date: 09/28/2022 CLINICAL DATA:  Fall with right eye injury. Right face abrasions. EXAM: CT MAXILLOFACIAL WITHOUT CONTRAST  TECHNIQUE: Multidetector CT imaging of the maxillofacial structures was performed. Multiplanar CT image reconstructions were also generated. RADIATION DOSE REDUCTION: This exam was performed according to the departmental dose-optimization program which includes automated exposure control, adjustment of the mA and/or kV according to patient size and/or use of iterative reconstruction technique. COMPARISON:  None Available. FINDINGS: Osseous: No acute fracture of the nasal bones, zygomatic arches, or mandibles. Midline nasal septum. Temporomandibular joints are congruent. Intact maxilla and pterygoid plates. Occasional dental caries. Orbits: No acute orbital fracture. No globe injury. Prior bilateral cataract resection. Sinuses: No sinus fracture or hemosinus. Tiny mucous retention cyst left maxillary sinus. Mastoid air cells are clear. Soft tissues: Mild soft tissue thickening involving the right cheek and infraorbital region. Limited intracranial: Assessed on concurrent head CT, reported separately. IMPRESSION: 1. No acute facial bone fracture. 2. Mild soft tissue thickening involving the right cheek and infraorbital region. Electronically Signed   By: Narda Rutherford M.D.   On: 09/28/2022 16:06   CT Head Wo Contrast  Result Date: 09/28/2022 CLINICAL DATA:  Fall 2 days ago, right-sided pain. EXAM: CT HEAD WITHOUT CONTRAST TECHNIQUE: Contiguous axial images were obtained from the base of the skull through the vertex without intravenous contrast.  RADIATION DOSE REDUCTION: This exam was performed according to the departmental dose-optimization program which includes automated exposure control, adjustment of the mA and/or kV according to patient size and/or use of iterative reconstruction technique. COMPARISON:  Head CT 10/10/2016 FINDINGS: Brain: No intracranial hemorrhage, mass effect, or midline shift. Age related atrophy. No hydrocephalus. The basilar cisterns are patent. Periventricular chronic small vessel  ischemic change. No evidence of territorial infarct or acute ischemia. No extra-axial or intracranial fluid collection. Vascular: Atherosclerosis of skullbase vasculature without hyperdense vessel or abnormal calcification. Skull: No fracture or focal lesion. Sinuses/Orbits: Assessed on concurrent face CT, reported separately. Other: No large scalp hematoma. IMPRESSION: 1. No acute intracranial abnormality. No skull fracture. 2. Age related atrophy and chronic small vessel ischemic change. Electronically Signed   By: Narda Rutherford M.D.   On: 09/28/2022 16:03   DG Ribs Unilateral W/Chest Right  Result Date: 09/28/2022 CLINICAL DATA:  Status post fall. EXAM: RIGHT RIBS AND CHEST - 3+ VIEW COMPARISON:  10/05/2021 FINDINGS: Heart size is normal. No pleural fluid or interstitial edema. No airspace opacities. Postoperative changes identified in the left midlung. There is a fracture involving the anterior aspect of the right 6th rib. IMPRESSION: 1. Acute fracture involving the anterior aspect of the right 6th rib. 2. No acute cardiopulmonary abnormalities. Electronically Signed   By: Signa Kell M.D.   On: 09/28/2022 11:31    Procedures Procedures    Medications Ordered in ED Medications  ondansetron (ZOFRAN) injection 4 mg (4 mg Intravenous Patient Refused/Not Given 09/28/22 1327)  lidocaine (LIDODERM) 5 % 2 patch (2 patches Transdermal Patch Applied 09/28/22 1322)  iohexol (OMNIPAQUE) 300 MG/ML solution 100 mL ( Intravenous Canceled Entry 09/28/22 1455)  Tdap (BOOSTRIX) injection 0.5 mL (0.5 mLs Intramuscular Given 09/28/22 1321)  traMADol (ULTRAM) tablet 50 mg (50 mg Oral Given 09/28/22 1523)  acetaminophen (TYLENOL) tablet 1,000 mg (1,000 mg Oral Given 09/28/22 1523)  cefTRIAXone (ROCEPHIN) 1 g in sodium chloride 0.9 % 100 mL IVPB (0 g Intravenous Stopped 09/28/22 1600)    ED Course/ Medical Decision Making/ A&P                             Medical Decision Making  Wendy Sandoval is a 82 y.o.  female.  With PMH of HTN, HLD, degenerative joint disease not on anticoagulation who presents with ongoing right side pain after falling 3 days prior.   Patient has right-sided chest wall tenderness with splinting concerning for underlying rib fracture from recent fall.  However she is also complaining of right upper quadrant pain and tenderness suggestive of possible underlying internal organ injury.  Patient had mechanical fall with no LOC.  She is neurologically intact.  Labs reviewed by me unremarkable and reassuring with normal hemoglobin 13.7 no evidence of anemia.  No transaminitis and normal lipase, doubt acute liver or gallbladder injury.  UA suggestive of UTI with small leukocyte esterase 11-20 WBCs and few bacteria.  Initial rib x-ray obtained which I reviewed concerning for right sixth rib fracture.  Due to head injury and abdominal pain and possible rib fracture.  Scan obtained without contrast at request of patient.  Signed out to Dr. Rhunette Croft pending CT imaging and reassessment.  Likely discharge home with pain control and antibiotics for UTI noted on exam today.  Given 1 dose of Rocephin in ED.  Amount and/or Complexity of Data Reviewed Labs: ordered. Radiology: ordered.  Risk OTC drugs. Prescription drug  management.     Final Clinical Impression(s) / ED Diagnoses Final diagnoses:  Fall, subsequent encounter  Urinary tract infection without hematuria, site unspecified  Closed fracture of one rib of right side, initial encounter    Rx / DC Orders ED Discharge Orders          Ordered    traMADol (ULTRAM) 50 MG tablet  Every 6 hours PRN        09/28/22 1528    ondansetron (ZOFRAN-ODT) 4 MG disintegrating tablet  Every 8 hours PRN        09/28/22 1528    lidocaine (LIDODERM) 5 %  Every 24 hours        09/28/22 1528    cephALEXin (KEFLEX) 500 MG capsule  2 times daily        09/28/22 1528              Mardene Sayer, MD 09/28/22 1831

## 2022-09-28 NOTE — Discharge Instructions (Addendum)
You have been seen in the Emergency Department (ED) today following a fall.  Your workup today did not reveal any injuries that require you to stay in the hospital.  You did have a rib fracture.  Keep using Tylenol and ibuprofen as needed for pain control as well as the tramadol prescribed and lidocaine patches.  Keep using the incentive spirometer provided to you every 2 hours as needed while awake to help with your breathing.    You did have a urinary tract infection, take the antibiotics Keflex as prescribed for the next 7 days.   Please follow up with your primary care doctor as soon as possible regarding today's ED visit and your recent accident.   Call your doctor or return to the ED if you develop a sudden or severe headache, confusion, slurred speech, facial droop, weakness or numbness in any arm or leg,  extreme fatigue, vomiting more than two times, severe abdominal pain, difficulty breathing or any other concerning signs or symptoms.

## 2022-10-02 ENCOUNTER — Telehealth: Payer: Self-pay | Admitting: *Deleted

## 2022-10-02 NOTE — Telephone Encounter (Signed)
Transition Care Management Unsuccessful Follow-up Telephone Call  Date of discharge and from where:  [facility] 09/29/2022  Attempts:  1st Attempt  Reason for unsuccessful TCM follow-up call:  Left voice message

## 2022-10-03 ENCOUNTER — Telehealth: Payer: Self-pay | Admitting: *Deleted

## 2022-10-03 NOTE — Telephone Encounter (Signed)
Transition Care Management Unsuccessful Follow-up Telephone Call  Date of discharge and from where:  [facility] 09/28/2022  Attempts:  2nd Attempt  Reason for unsuccessful TCM follow-up call:  No answer/busy    

## 2022-10-22 ENCOUNTER — Encounter: Payer: Self-pay | Admitting: Family Medicine

## 2022-10-22 ENCOUNTER — Ambulatory Visit (INDEPENDENT_AMBULATORY_CARE_PROVIDER_SITE_OTHER): Payer: Medicare Other | Admitting: Family Medicine

## 2022-10-22 VITALS — BP 120/60 | HR 48 | Temp 97.8°F | Ht 60.0 in | Wt 155.7 lb

## 2022-10-22 DIAGNOSIS — S0083XD Contusion of other part of head, subsequent encounter: Secondary | ICD-10-CM

## 2022-10-22 DIAGNOSIS — R911 Solitary pulmonary nodule: Secondary | ICD-10-CM

## 2022-10-22 DIAGNOSIS — I10 Essential (primary) hypertension: Secondary | ICD-10-CM

## 2022-10-22 DIAGNOSIS — R29898 Other symptoms and signs involving the musculoskeletal system: Secondary | ICD-10-CM | POA: Diagnosis not present

## 2022-10-22 DIAGNOSIS — S20211D Contusion of right front wall of thorax, subsequent encounter: Secondary | ICD-10-CM

## 2022-10-22 NOTE — Patient Instructions (Signed)
Try to get on stationary bicycle couple times daily and gradually build up duration.

## 2022-10-22 NOTE — Progress Notes (Signed)
Established Patient Office Visit  Subjective   Patient ID: Wendy Sandoval, female    DOB: Apr 16, 1941  Age: 82 y.o. MRN: [id number]  Chief Complaint  Patient presents with   Hospitalization Follow-up    HPI   Wendy Sandoval is here for recent ER follow-up.  She had been mowing her lawn and had already gotten off the lawnmower when she states her right lower extremity gave way and she fell to the ground striking her right chest area and right side of face.  No loss of consciousness.  She does recall hitting her head.  She continued to have some right side pain and her family urged her to get checked out.  She was seen in the ER on 24 May.  She had right-sided rib films with question of acute fracture right sixth rib.  She had other imaging including CT head, CT cervical spine, maxillofacial CT, and CT abdomen pelvis all unrevealing of any significant acute injury.  She had some pyuria and was started on Keflex 500 mg twice daily.  She has been on low-dose once daily Keflex for prophylaxis for UTI from urology for quite some time.  She was prescribed tramadol but felt poorly on that and has been taking just plain Tylenol.  Still has some occasional low back pain but side pain improved.  No headaches.  No confusion.  Her lab work including CBC normal.  She complains of some low back pain now which is fairly diffuse and chronic and intermittent.  No sciatica symptoms currently.  Denies any lower extremity numbness or weakness.  No urine or stool incontinence.  She has hypertension treated with amlodipine, losartan, HCTZ, and metoprolol.  Denies any recent orthostatic symptoms  She did have incidental note of 3 mm right upper lobe nodule with no recommended follow-up and low risk patient.  She is low risk as she smoked only a handful of cigarettes in her life generally.  No chronic cough or other concerning symptoms.  Past Medical History:  Diagnosis Date   Basal cell carcinoma 04/23/2011   upper  left cheek (txpbx)   Chronic UTI (urinary tract infection)    recurrent   Clark level II melanoma (HCC) 10/09/2021   Left Elbow Post (excision)   Depression    DIVERTICULOSIS, COLON 03/17/2007   DJD (degenerative joint disease)    Esophageal stricture    Fatty liver    GERD 08/09/2008   Headache(784.0)    Hiatal hernia    HYPERCHOLESTEROLEMIA 07/07/2007   HYPERTENSION 07/07/2007   IBS (irritable bowel syndrome)    SCC (squamous cell carcinoma) 08/18/2014   right forearm (txpbx) cautery    Schatzki's ring    Squamous cell carcinoma of skin 11/29/2010   left inner shin ([id number]) KA   TMJ syndrome    Past Surgical History:  Procedure Laterality Date   ABDOMINAL HYSTERECTOMY  1998   APPENDECTOMY     BREAST EXCISIONAL BIOPSY Left    Benign    BREAST SURGERY     milk gland   LUNG SURGERY  1983    reports that she has never smoked. She has never used smokeless tobacco. She reports that she does not drink alcohol and does not use drugs. family history includes Alcohol abuse in an other family member; Arthritis in her mother and another family member; Diabetes in an other family member; Heart disease in her mother and another family member; Hyperlipidemia in an other family member; Hypertension in an other family member; Stroke  in her father and another family member; Thyroid disease in her father. Allergies  Allergen Reactions   Codeine Sulfate     REACTION: nausea   Histamine     Anxious feeling    Review of Systems  Constitutional:  Negative for malaise/fatigue.  Eyes:  Negative for blurred vision.  Respiratory:  Negative for shortness of breath.   Cardiovascular:  Negative for chest pain.  Gastrointestinal:  Negative for abdominal pain.  Genitourinary:  Negative for dysuria.  Musculoskeletal:  Positive for back pain.  Neurological:  Negative for dizziness, weakness and headaches.      Objective:     BP 120/60 (BP Location: Left Arm, Patient Position: Sitting, Cuff  Size: Normal)   Pulse (!) 48   Temp 97.8 F (36.6 C) (Oral)   Ht 5' (1.524 m)   Wt 155 lb 11.2 oz (70.6 kg)   SpO2 98%   BMI 30.41 kg/m  BP Readings from Last 3 Encounters:  10/22/22 120/60  09/28/22 (!) 144/65  07/12/22 (!) 144/70   Wt Readings from Last 3 Encounters:  10/22/22 155 lb 11.2 oz (70.6 kg)  07/12/22 159 lb 8 oz (72.3 kg)  12/19/21 159 lb (72.1 kg)      Physical Exam Vitals reviewed.  Constitutional:      General: She is not in acute distress.    Appearance: Normal appearance.  HENT:     Head: Normocephalic and atraumatic.  Cardiovascular:     Rate and Rhythm: Normal rate and regular rhythm.  Pulmonary:     Effort: Pulmonary effort is normal.     Breath sounds: Normal breath sounds. No wheezing or rales.  Musculoskeletal:     Right lower leg: No edema.     Left lower leg: No edema.     Comments: Straight leg raises are negative bilaterally  Neurological:     General: No focal deficit present.     Mental Status: She is alert.     Cranial Nerves: No cranial nerve deficit.     Motor: No weakness.     Comments: Full strength lower extremities.  Symmetric reflexes ankle and knee bilaterally  Psychiatric:        Mood and Affect: Mood normal.      No results found for any visits on 10/22/22.  Last CBC Lab Results  Component Value Date   WBC 6.7 09/28/2022   HGB 13.7 09/28/2022   HCT 38.9 09/28/2022   MCV 89.8 09/28/2022   MCH 31.6 09/28/2022   RDW 12.4 09/28/2022   PLT 213 09/28/2022   Last metabolic panel Lab Results  Component Value Date   GLUCOSE 90 09/28/2022   NA 140 09/28/2022   K 3.5 09/28/2022   CL 104 09/28/2022   CO2 28 09/28/2022   BUN 12 09/28/2022   CREATININE 0.54 09/28/2022   GFRNONAA >60 09/28/2022   CALCIUM 9.3 09/28/2022   PROT 6.9 09/28/2022   ALBUMIN 4.0 09/28/2022   BILITOT 0.6 09/28/2022   ALKPHOS 54 09/28/2022   AST 13 (L) 09/28/2022   ALT 9 09/28/2022   ANIONGAP 8 09/28/2022      The ASCVD Risk score  (Arnett DK, et al., 2019) failed to calculate for the following reasons:   The 2019 ASCVD risk score is only valid for ages 63 to 80    Assessment & Plan:   #1 recent fall with right facial contusion and right chest wall pain with imaging showing questionable right sided rib fracture not confirmed on other  imaging.  Overall improving.  She relates recurrent falls in the past.  -Strongly suggest she start exercising regularly specially to strengthen quadriceps.  She has home exercise bike and is encouraged to start there.  We offered referral for outpatient physical therapy but she declines at this time.  #2 hypertension stable and at goal.  Continue current regimen she had recent CMP and her electrolytes and kidney function were stable.  #3 3 mm right upper lobe incidental nodule noted on recent CT scan.  Patient is low risk and does not need further workup at this time  Evelena Peat, MD

## 2022-10-31 ENCOUNTER — Other Ambulatory Visit: Payer: Self-pay | Admitting: Family Medicine

## 2022-11-15 ENCOUNTER — Ambulatory Visit (INDEPENDENT_AMBULATORY_CARE_PROVIDER_SITE_OTHER): Payer: Medicare Other

## 2022-11-15 VITALS — Ht 61.5 in | Wt 155.0 lb

## 2022-11-15 DIAGNOSIS — Z0000 Encounter for general adult medical examination without abnormal findings: Secondary | ICD-10-CM

## 2022-11-15 NOTE — Progress Notes (Signed)
Subjective:   Wendy Sandoval is a 82 y.o. female who presents for Medicare Annual (Subsequent) preventive examination.  Visit Complete: Virtual  I connected with  Wendy Sandoval on 11/15/22 by a audio enabled telemedicine application and verified that I am speaking with the correct person using two identifiers.  Patient Location: Home  Provider Location: Home Office  I discussed the limitations of evaluation and management by telemedicine. The patient expressed understanding and agreed to proceed.  Patient Medicare AWV questionnaire was completed by the patient on ; I have confirmed that all information answered by patient is correct and no changes since this date.  Review of Systems     Cardiac Risk Factors include: advanced age (>24men, >55 women);hypertension     Objective:    Today's Vitals   11/15/22 1117  Weight: 155 lb (70.3 kg)  Height: 5' 1.5" (1.562 m)   Body mass index is 28.81 kg/m.     11/15/2022   11:28 AM 09/28/2022   10:34 AM 11/09/2021    3:10 PM 10/27/2020    9:27 AM 08/27/2017    9:43 AM 10/10/2016    8:52 PM 12/22/2015   11:49 AM  Advanced Directives  Does Patient Have a Medical Advance Directive? Yes No Yes Yes Yes No Yes  Type of Estate agent of [place];Living will Healthcare Power of [employer] of [place];Living will Healthcare Power of [place];Living will     Does patient want to make changes to medical advance directive?   No - Patient declined      Copy of Healthcare Power of Attorney in Chart? No - copy requested  No - copy requested No - copy requested   No - copy requested    Current Medications (verified) Outpatient Encounter Medications as of 11/15/2022  Medication Sig   amLODipine (NORVASC) 5 MG tablet TAKE 1 TABLET BY MOUTH EVERY DAY   cholecalciferol (VITAMIN D) 1000 UNITS tablet Take 1,000 Units by mouth 2 (two) times daily.    escitalopram (LEXAPRO) 10 MG tablet TAKE 1 TABLET BY MOUTH EVERY DAY    esomeprazole (NEXIUM) 20 MG capsule Take 20 mg by mouth daily at 12 noon.   hydrochlorothiazide (HYDRODIURIL) 25 MG tablet TAKE 1 TABLET (25 MG TOTAL) BY MOUTH DAILY. *YEARLY APPOINTMENT REQUIRED FOR FUTURE REFILLS   lidocaine (LIDODERM) 5 % Place 1 patch onto the skin daily. Remove & Discard patch within 12 hours or as directed by MD   losartan (COZAAR) 50 MG tablet TAKE 1 TABLET BY MOUTH EVERY DAY   metoprolol tartrate (LOPRESSOR) 50 MG tablet TAKE 1/2 TABLET (25 MG TOTAL) BY MOUTH 2 (TWO) TIMES DAILY.   mupirocin ointment (BACTROBAN) 2 % Apply 1 Application topically daily.   ondansetron (ZOFRAN-ODT) 4 MG disintegrating tablet Take 1 tablet (4 mg total) by mouth every 8 (eight) hours as needed.   simvastatin (ZOCOR) 40 MG tablet TAKE 1 TABLET BY MOUTH EVERYDAY AT BEDTIME   traMADol (ULTRAM) 50 MG tablet Take 1 tablet (50 mg total) by mouth every 6 (six) hours as needed for up to 10 doses for severe pain.   triamcinolone cream (KENALOG) 0.1 % Apply to leg nightly to as needed   No facility-administered encounter medications on file as of 11/15/2022.    Allergies (verified) Codeine sulfate and Histamine   History: Past Medical History:  Diagnosis Date   Basal cell carcinoma 04/23/2011   upper left cheek (txpbx)   Chronic UTI (urinary tract infection)  recurrent   Clark level II melanoma (HCC) 10/09/2021   Left Elbow Post (excision)   Depression    DIVERTICULOSIS, COLON 03/17/2007   DJD (degenerative joint disease)    Esophageal stricture    Fatty liver    GERD 08/09/2008   Headache(784.0)    Hiatal hernia    HYPERCHOLESTEROLEMIA 07/07/2007   HYPERTENSION 07/07/2007   IBS (irritable bowel syndrome)    SCC (squamous cell carcinoma) 08/18/2014   right forearm (txpbx) cautery    Schatzki's ring    Squamous cell carcinoma of skin 11/29/2010   left inner shin (cx29fu) KA   TMJ syndrome    Past Surgical History:  Procedure Laterality Date   ABDOMINAL HYSTERECTOMY  1998    APPENDECTOMY     BREAST EXCISIONAL BIOPSY Left    Benign    BREAST SURGERY     milk gland   LUNG SURGERY  1983   Family History  Problem Relation Age of Onset   Heart disease Mother    Arthritis Mother    Stroke Father    Thyroid disease Father    Arthritis Other    Hyperlipidemia Other    Hypertension Other    Alcohol abuse Other    Diabetes Other    Stroke Other    Heart disease Other    Breast cancer Neg Hx    Stomach cancer Neg Hx    Colon cancer Neg Hx    Esophageal cancer Neg Hx    Social History   Socioeconomic History   Marital status: Widowed    Spouse name: Not on file   Number of children: Not on file   Years of education: Not on file   Highest education level: Not on file  Occupational History   Not on file  Tobacco Use   Smoking status: Never   Smokeless tobacco: Never  Vaping Use   Vaping status: Never Used  Substance and Sexual Activity   Alcohol use: No   Drug use: No   Sexual activity: Not on file  Other Topics Concern   Not on file  Social History Narrative   Not on file   Social Determinants of Health   Financial Resource Strain: Low Risk  (11/15/2022)   Overall Financial Resource Strain (CARDIA)    Difficulty of Paying Living Expenses: Not hard at all  Food Insecurity: No Food Insecurity (11/15/2022)   Hunger Vital Sign    Worried About Running Out of Food in the Last Year: Never true    Ran Out of Food in the Last Year: Never true  Transportation Needs: No Transportation Needs (11/15/2022)   PRAPARE - Administrator, Civil Service (Medical): No    Lack of Transportation (Non-Medical): No  Physical Activity: Inactive (11/15/2022)   Exercise Vital Sign    Days of Exercise per Week: 0 days    Minutes of Exercise per Session: 0 min  Stress: No Stress Concern Present (11/15/2022)   [employer] of Occupational Health - Occupational Stress Questionnaire    Feeling of Stress : Not at all  Social Connections: Moderately  Integrated (11/15/2022)   Social Connection and Isolation Panel [NHANES]    Frequency of Communication with Friends and Family: More than three times a week    Frequency of Social Gatherings with Friends and Family: More than three times a week    Attends Religious Services: More than 4 times per year    Active Member of [employer] or Organizations: Yes  Attends Banker Meetings: More than 4 times per year    Marital Status: Widowed    Tobacco Counseling Counseling given: Not Answered   Clinical Intake:  Pre-visit preparation completed: No  Pain : No/denies pain     BMI - recorded: 28.81 Nutritional Status: BMI 25 -29 Overweight Nutritional Risks: None Diabetes: No  How often do you need to have someone help you when you read instructions, pamphlets, or other written materials from your doctor or pharmacy?: 1 - Never  Interpreter Needed?: No  Information entered by :: Theresa Mulligan  LPN   Activities of Daily Living    11/15/2022   11:25 AM  In your present state of health, do you have any difficulty performing the following activities:  Hearing? 0  Vision? 0  Difficulty concentrating or making decisions? 0  Walking or climbing stairs? 0  Dressing or bathing? 0  Doing errands, shopping? 0  Preparing Food and eating ? N  Using the Toilet? N  In the past six months, have you accidently leaked urine? Y  Comment Wears pads. Followed by Urologist  Do you have problems with loss of bowel control? N  Managing your Medications? N  Managing your Finances? N  Housekeeping or managing your Housekeeping? N    Patient Care Team: Kristian Covey, MD as PCP - [facility] [place], Judye Bos, PA-C as Physician Assistant (Dermatology)  Indicate any recent Medical Services you may have received from other than [facility] providers in the past year (date may be approximate).     Assessment:   This is a routine wellness examination for [place].  Hearing/Vision  screen Hearing Screening - Comments:: Denies hearing difficulties   Vision Screening - Comments:: Wears rx glasses - up to date with routine eye exams with  Dr Marti Sleigh  Dietary issues and exercise activities discussed:     Goals Addressed               This Visit's Progress     Stay Healthy (pt-stated)         Depression Screen    11/15/2022   11:23 AM 10/22/2022   11:29 AM 07/12/2022    9:06 AM 11/09/2021    3:04 PM 10/05/2021    3:07 PM 10/27/2020    9:28 AM 10/27/2020    9:26 AM  PHQ 2/9 Scores  PHQ - 2 Score 0 0 0 0 0 0 0  PHQ- 9 Score 0 4 0 0 3      Fall Risk    11/15/2022   11:26 AM 11/09/2021    3:08 PM 10/05/2021    3:06 PM 10/27/2020    9:28 AM 09/22/2019    8:50 AM  Fall Risk   Falls in the past year? 1 0 1 0 0  Number falls in past yr: 0 0 0 0 0  Injury with Fall? 1 0 1 0 0  Comment Skin tear to rt eye. Followed by medical attention      Risk for fall due to : No Fall Risks No Fall Risks No Fall Risks History of fall(s)   Follow up Falls prevention discussed  Falls evaluation completed  Falls evaluation completed    MEDICARE RISK AT HOME:  Medicare Risk at Home - 11/15/22 1133     Any stairs in or around the home? Yes    If so, are there any without handrails? No    Home free of loose throw rugs in walkways, pet beds, electrical  cords, etc? Yes    Adequate lighting in your home to reduce risk of falls? Yes    Life alert? No    Use of a cane, walker or w/c? No    Grab bars in the bathroom? Yes    Shower chair or bench in shower? Yes    Elevated toilet seat or a handicapped toilet? Yes             TIMED UP AND GO:  Was the test performed?  No    Cognitive Function:    12/22/2015   11:54 AM  MMSE - Mini Mental State Exam  Not completed: --        11/09/2021    3:10 PM  6CIT Screen  What Year? 0 points  What month? 0 points  What time? 0 points  Count back from 20 0 points  Months in reverse 0 points  Repeat phrase 0 points  Total Score 0  points    Immunizations Immunization History  Administered Date(s) Administered   Influenza Split 03/13/2011, 02/21/2012   Influenza Whole 02/20/2009, 02/22/2013   Influenza, High Dose Seasonal PF 02/18/2014, 02/10/2017, 03/02/2018, 02/26/2019, 02/21/2020   Influenza-Unspecified 02/14/2016   Moderna Sars-Covid-2 Vaccination 07/15/2019, 08/12/2019   Pneumococcal Conjugate-13 03/13/2016   Pneumococcal Polysaccharide-23 07/04/2011   Tdap 09/28/2022   Zoster, Live 04/20/2013    Tdap: Due Education provided  Flu Vaccine status: Declined, Education has been provided regarding the importance of this vaccine but patient still declined. Advised may receive this vaccine at local pharmacy or Health Dept. Aware to provide a copy of the vaccination record if obtained from local pharmacy or Health Dept. Verbalized acceptance and understanding.  Pneumococcal vaccine status: Up to date  Covid-19 vaccine status: Completed vaccines  Qualifies for Shingles Vaccine? Yes   Zostavax completed No   Shingrix Completed?: No.    Education has been provided regarding the importance of this vaccine. Patient has been advised to call insurance company to determine out of pocket expense if they have not yet received this vaccine. Advised may also receive vaccine at local pharmacy or Health Dept. Verbalized acceptance and understanding.  Screening Tests Health Maintenance  Topic Date Due   Zoster Vaccines- Shingrix (1 of 2) 04/27/1960   DEXA SCAN  Never done   COVID-19 Vaccine (3 - Moderna risk series) 09/09/2019   INFLUENZA VACCINE  12/05/2022   Medicare Annual Wellness (AWV)  11/15/2023   DTaP/Tdap/Td (2 - Td or Tdap) 09/27/2032   Pneumonia Vaccine 48+ Years old  Completed   HPV VACCINES  Aged Out    Health Maintenance  Health Maintenance Due  Topic Date Due   Zoster Vaccines- Shingrix (1 of 2) 04/27/1960   DEXA SCAN  Never done   COVID-19 Vaccine (3 - Moderna risk series) 09/09/2019     Colorectal cancer screening: No longer required.   Mammogram status: No longer required due to Age.  Bone Density status: Ordered Deferred. Pt provided with contact info and advised to call to schedule appt.  Lung Cancer Screening: (Low Dose CT Chest recommended if Age 45-80 years, 20 pack-year currently smoking OR have quit w/in 15years.)  qualify.     Additional Screening:  Hepatitis C Screening: does not qualify; Completed   Vision Screening: Recommended annual ophthalmology exams for early detection of glaucoma and other disorders of the eye. Is the patient up to date with their annual eye exam?  Yes  Who is the provider or what is the name of the  office in which the patient attends annual eye exams? Dr Burgess Estelle If pt is not established with a provider, would they like to be referred to a provider to establish care? No .   Dental Screening: Recommended annual dental exams for proper oral hygiene   Community Resource Referral / Chronic Care Management:  CRR required this visit?  No   CCM required this visit?  No     Plan:     I have personally reviewed and noted the following in the patient's chart:   Medical and social history Use of alcohol, tobacco or illicit drugs  Current medications and supplements including opioid prescriptions. Patient is currently taking opioid prescriptions. Information provided to patient regarding non-opioid alternatives. Patient advised to discuss non-opioid treatment plan with their provider. Functional ability and status Nutritional status Physical activity Advanced directives List of other physicians Hospitalizations, surgeries, and ER visits in previous 12 months Vitals Screenings to include cognitive, depression, and falls Referrals and appointments  In addition, I have reviewed and discussed with patient certain preventive protocols, quality metrics, and best practice recommendations. A written personalized care plan for  preventive services as well as general preventive health recommendations were provided to patient.     Tillie Rung, LPN   [id number]/61/[id number]   After Visit Summary: (MyChart) Due to this being a telephonic visit, the after visit summary with patients personalized plan was offered to patient via MyChart   Nurse Notes: None

## 2022-11-15 NOTE — Patient Instructions (Addendum)
Wendy Sandoval , Thank you for taking time to come for your Medicare Wellness Visit. I appreciate your ongoing commitment to your health goals. Please review the following plan we discussed and let me know if I can assist you in the future.   These are the goals we discussed:  Goals       Exercise 150 minutes per week (moderate activity)      Think about exercise again; gentle yoga; piliates; silver sneaker;  Interval exercises       Lose 20-25lbs (pt-stated)      I want to cut down on my sweets.      Stay Healthy (pt-stated)      Weight (lb) < 145 lb (65.8 kg)      15lbs is enough or 150lb  A normal BMI (body mass index) 82 yo 27 over 65; and you are 82  Cut down on sweets; cut back on pepsi  You can but YASSO bars, which are 100 calories and ice cream with yogurt          Weight Loss Achieved      Evidence-based guidance:  Review medication that may contribute to weight gain, such as corticosteroid, beta-blocker, tricyclic antidepressant, oral antihyperglycemic; advocate for changes when appropriate.  Perform or refer to registered dietitian to perform comprehensive nutrition assessment that includes disordered-eating behaviors, such as binge-eating, emotional or compulsive eating, grazing.  Counsel patient regarding health risks of obesity and that weight loss goal of 5 to 10 percent of initial weight will improve risk.  Recommend initial weight loss goal of 3 to 5 percent of bodyweight; increase weight-loss goals based on patient success as achieving greater weight loss continues to reduce risk.  Propose a calorie-reduced diet based on the patient's preferences and health status.  Provide ongoing emotional support or cognitive behavioral therapy and dietitian services (individual, group, virtual) over at least 6 months with a minimum of 14 encounters to best facilitate weight loss.  Provide monthly follow-up for 12 months when weight loss goal is met to assist with maintenance of  weight loss.  Encourage increased physical activity or exercise based on individual age, risk, and ability up to 200 to 300 minutes per week that includes aerobic and resistance training.  Encourage reduction in sedentary behaviors by replacing them with nonexercise yet active leisure pursuits.  Identify physical barriers, such as change in posture, balance, gait patterns, joint pain, and environmental barriers to activity.  Consider referral to rehabilitation therapy, especially when mobility or function is impaired due to osteoarthritis and obesity.  Consider referral to weight-loss program that has published evidence of safety and efficacy if on-site intensive intervention is unavailable or patient preference.  Prepare patient for use of pharmacologic therapy as an adjunct to lifestyle changes based on body mass index, patient agreement and presence of risk factors or comorbidities.  Evaluate efficacy of pharmacologic therapy (weight loss) and tolerance to medication periodically.  Engage in shared decision-making regarding referral to bariatric surgeon for consultation and evaluation when weight-loss goal has not been accomplished by behavioral therapy with or without pharmacologic therapy.   Notes:         This is a list of the screening recommended for you and due dates:  Health Maintenance  Topic Date Due   Zoster (Shingles) Vaccine (1 of 2) 04/27/1960   DEXA scan (bone density measurement)  Never done   COVID-19 Vaccine (3 - Moderna risk series) 09/09/2019   Flu Shot  12/05/2022   Medicare  Annual Wellness Visit  11/15/2023   DTaP/Tdap/Td vaccine (2 - Td or Tdap) 09/27/2032   Pneumonia Vaccine  Completed   HPV Vaccine  Aged Out  Opioid Pain Medicine Management Opioid pain medicines are strong medicines that are used to treat bad or very bad pain. When you take them for a short time, they can help you: Sleep better. Do better in physical therapy. Feel better during the first few  days after you get hurt. Recover from surgery. Only take these medicines if a doctor says that you can. You should only take them for a short time. This is because opioids can be very addictive. This means that they are hard to stop taking. The longer you take opioids, the harder it may be to stop taking them. What are the risks? Opioids can cause problems (side effects). Taking them for more than 3 days raises your chance of problems, such as: Trouble pooping (constipation). Feeling sick to your stomach (nausea). Vomiting. Feeling very sleepy. Confusion. Not being able to stop taking the medicine. Breathing problems. Taking opioids for a long time can make it hard for you to do daily tasks. It can also put you at risk for: Car accidents. Depression. Suicide. Heart attack. Taking too much of the medicine (overdose). This can lead to death. What is a pain treatment plan? A pain treatment plan is a plan made by you and your doctor. Work with your doctor to make a plan for treating your pain. To help you do this: Talk about the goals of your treatment, including: How much pain you might expect to have. How you will manage the pain. Talk about the risks and benefits of taking these medicines for your condition. Remember that a good treatment plan uses more than one approach and lowers the risks of side effects. Tell your doctor about the amount of medicines you take and about any drug or alcohol use. Get your pain medicine prescriptions from only one doctor. Pain can be managed with other treatments. Work with your doctor to find other ways to help your pain, such as: Physical therapy or doing gentle exercises. Counseling. Eating healthy foods. Massage. Meditation. Other pain medicines. How to use opioid pain medicine safely Taking medicine Take your pain medicine exactly as told by your doctor. Take it only when you need it. If your pain is not too bad, you may take less medicine if  your doctor allows. If you have no pain, do not take the medicine unless your doctor tells you to take it. If your pain is very bad, do not take more medicine than your doctor told you to take. Call your doctor to know what to do. Write down the times when you take your pain medicine. Look at the times before you take your next dose. Take other over-the-counter or prescription medicines only as told by your doctor. Keeping yourself and others safe  While you are taking opioids: Do not drive, use machines, or power tools. Do not sign important papers (legal documents). Do not drink alcohol. Do not take sleeping pills. Do not take care of children by yourself. Do not do activities where you need to climb or be in high places, like working on a ladder. Do not go to a lake, river, ocean, swimming pool, or hot tub. Keep your opioids locked up or in a place where children cannot reach them. Do not share your pain medicine with anyone. Stopping your use of opioids If you have been  taking opioids for more than a few weeks, you may need to slowly decrease (taper) how much you take until you stop taking them. Doing this can lower your chance of having symptoms.  Symptoms that come from suddenly stopping the use of opioids include: Pain and cramping in your belly (abdomen). Feeling sick to your stomach (nausea).z Sweating. Feeling very sleepy. Feeling restless. Shaking you cannot control (tremors). Cravings for the medicine. Do not try to stop taking them by yourself. Work with your doctor to stop. Your doctor will help you take less until you are not taking the medicine at all. Getting rid of unused pills Do not save any pills that you did not use. Get rid of the pills by: Taking them to a take-back program in your area. Bringing them to a pharmacy that receives unused pills. Flushing them down the toilet. Check the label or package insert of your medicine to see whether this is safe to  do. Throwing them in the trash. Check the label or package insert of your medicine to see whether this is safe to do. If it is safe to throw them out: Take the pills out of their container. Put the pills into a container you can seal. Mix the pills with used coffee grounds, food scraps, dirt, or cat litter. Put this in the trash. Follow these instructions at home: Activity Do exercises as told by your doctor. Avoid doing things that make your pain worse. Return to your normal activities as told by your doctor. Ask your doctor what activities are safe for you. General instructions You may need to take these actions to prevent or treat constipation: Drink enough fluid to keep your pee (urine) pale yellow. Take over-the-counter or prescription medicines. Eat foods that are high in fiber. These include beans, whole grains, and fresh fruits and vegetables. Limit foods that are high in fat and sugar. These include fried or sweet foods. Keep all follow-up visits. Where to find support If you have been taking opioids for a long time, get help from a local support group or counselor. Ask your doctor about this. Where to find more information Centers for Disease Control and Prevention (CDC): FootballExhibition.com.br [place] Food and Drug Administration (FDA): PumpkinSearch.com.ee Get help right away if: You may have taken too much of an opioid (overdosed). Common symptoms of an overdose: Your breathing is slower or more shallow than normal. You have a very slow heartbeat. Your speech is not normal. You vomit or you feel as if you may vomit. The black centers of your eyes (pupils) are smaller than normal. You have other potential symptoms: You feel very confused. You faint. You are very sleepy. You have cold skin. You have blue lips or fingernails. You have thoughts of harming yourself or harming others. These symptoms may be an emergency. Get help right away. Call your local emergency services (911 in the  [place] Do not wait to see if the symptoms will go away. Do not drive yourself to the hospital. Get help right away if you feel like you may hurt yourself or others, or have thoughts about taking your own life. Go to your nearest emergency room or: Call your local emergency services (911 in the [place] Call the [facility] at 223-210-0899. Call a suicide crisis helpline, such as the National Suicide Prevention Lifeline at 478-186-7317 or 988 in the [place] This is open 24 hours a day. Text the Crisis Text Line at (706) 804-9724. Summary Opioid are strong medicines  that are used to treat bad or very bad pain. A pain treatment plan is a plan made by you and your doctor. Work with your doctor to make a plan for treating your pain. If you think that you or someone else may have taken too much of an opioid, get help right away. This information is not intended to replace advice given to you by your health care provider. Make sure you discuss any questions you have with your health care provider. Document Revised: 11/15/2020 Document Reviewed: 08/02/2020 Elsevier Patient Education  2024 Else[employer] Inc.   Advanced directives: Please bring a copy of your health care power of attorney and living will to the office to be added to your chart at your convenience.   Conditions/risks identified: None  Next appointment: Follow up in one year for your annual wellness visit    Preventive Care 65 Years and Older, Female Preventive care refers to lifestyle choices and visits with your health care provider that can promote health and wellness. What does preventive care include? A yearly physical exam. This is also called an annual well check. Dental exams once or twice a year. Routine eye exams. Ask your health care provider how often you should have your eyes checked. Personal lifestyle choices, including: Daily care of your teeth and gums. Regular physical activity. Eating a healthy  diet. Avoiding tobacco and drug use. Limiting alcohol use. Practicing safe sex. Taking low-dose aspirin every day. Taking vitamin and mineral supplements as recommended by your health care provider. What happens during an annual well check? The services and screenings done by your health care provider during your annual well check will depend on your age, overall health, lifestyle risk factors, and family history of disease. Counseling  Your health care provider may ask you questions about your: Alcohol use. Tobacco use. Drug use. Emotional well-being. Home and relationship well-being. Sexual activity. Eating habits. History of falls. Memory and ability to understand (cognition). Work and work Astronomer. Reproductive health. Screening  You may have the following tests or measurements: Height, weight, and BMI. Blood pressure. Lipid and cholesterol levels. These may be checked every 5 years, or more frequently if you are over 40 years old. Skin check. Lung cancer screening. You may have this screening every year starting at age 59 if you have a 30-pack-year history of smoking and currently smoke or have quit within the past 15 years. Fecal occult blood test (FOBT) of the stool. You may have this test every year starting at age 31. Flexible sigmoidoscopy or colonoscopy. You may have a sigmoidoscopy every 5 years or a colonoscopy every 10 years starting at age 69. Hepatitis C blood test. Hepatitis B blood test. Sexually transmitted disease (STD) testing. Diabetes screening. This is done by checking your blood sugar (glucose) after you have not eaten for a while (fasting). You may have this done every 1-3 years. Bone density scan. This is done to screen for osteoporosis. You may have this done starting at age 36. Mammogram. This may be done every 1-2 years. Talk to your health care provider about how often you should have regular mammograms. Talk with your health care provider about  your test results, treatment options, and if necessary, the need for more tests. Vaccines  Your health care provider may recommend certain vaccines, such as: Influenza vaccine. This is recommended every year. Tetanus, diphtheria, and acellular pertussis (Tdap, Td) vaccine. You may need a Td booster every 10 years. Zoster vaccine. You may need this after  age 13. Pneumococcal 13-valent conjugate (PCV13) vaccine. One dose is recommended after age 28. Pneumococcal polysaccharide (PPSV23) vaccine. One dose is recommended after age 27. Talk to your health care provider about which screenings and vaccines you need and how often you need them. This information is not intended to replace advice given to you by your health care provider. Make sure you discuss any questions you have with your health care provider. Document Released: 05/19/2015 Document Revised: 01/10/2016 Document Reviewed: 02/21/2015 Elsevier Interactive Patient Education  2017 [employer].  Fall Prevention in the Home Falls can cause injuries. They can happen to people of all ages. There are many things you can do to make your home safe and to help prevent falls. What can I do on the outside of my home? Regularly fix the edges of walkways and driveways and fix any cracks. Remove anything that might make you trip as you walk through a door, such as a raised step or threshold. Trim any bushes or trees on the path to your home. Use bright outdoor lighting. Clear any walking paths of anything that might make someone trip, such as rocks or tools. Regularly check to see if handrails are loose or broken. Make sure that both sides of any steps have handrails. Any raised decks and porches should have guardrails on the edges. Have any leaves, snow, or ice cleared regularly. Use sand or salt on walking paths during winter. Clean up any spills in your garage right away. This includes oil or grease spills. What can I do in the bathroom? Use  night lights. Install grab bars by the toilet and in the tub and shower. Do not use towel bars as grab bars. Use non-skid mats or decals in the tub or shower. If you need to sit down in the shower, use a plastic, non-slip stool. Keep the floor dry. Clean up any water that spills on the floor as soon as it happens. Remove soap buildup in the tub or shower regularly. Attach bath mats securely with double-sided non-slip rug tape. Do not have throw rugs and other things on the floor that can make you trip. What can I do in the bedroom? Use night lights. Make sure that you have a light by your bed that is easy to reach. Do not use any sheets or blankets that are too big for your bed. They should not hang down onto the floor. Have a firm chair that has side arms. You can use this for support while you get dressed. Do not have throw rugs and other things on the floor that can make you trip. What can I do in the kitchen? Clean up any spills right away. Avoid walking on wet floors. Keep items that you use a lot in easy-to-reach places. If you need to reach something above you, use a strong step stool that has a grab bar. Keep electrical cords out of the way. Do not use floor polish or wax that makes floors slippery. If you must use wax, use non-skid floor wax. Do not have throw rugs and other things on the floor that can make you trip. What can I do with my stairs? Do not leave any items on the stairs. Make sure that there are handrails on both sides of the stairs and use them. Fix handrails that are broken or loose. Make sure that handrails are as long as the stairways. Check any carpeting to make sure that it is firmly attached to the stairs. Fix  any carpet that is loose or worn. Avoid having throw rugs at the top or bottom of the stairs. If you do have throw rugs, attach them to the floor with carpet tape. Make sure that you have a light switch at the top of the stairs and the bottom of the  stairs. If you do not have them, ask someone to add them for you. What else can I do to help prevent falls? Wear shoes that: Do not have high heels. Have rubber bottoms. Are comfortable and fit you well. Are closed at the toe. Do not wear sandals. If you use a stepladder: Make sure that it is fully opened. Do not climb a closed stepladder. Make sure that both sides of the stepladder are locked into place. Ask someone to hold it for you, if possible. Clearly mark and make sure that you can see: Any grab bars or handrails. First and last steps. Where the edge of each step is. Use tools that help you move around (mobility aids) if they are needed. These include: Canes. Walkers. Scooters. Crutches. Turn on the lights when you go into a dark area. Replace any light bulbs as soon as they burn out. Set up your furniture so you have a clear path. Avoid moving your furniture around. If any of your floors are uneven, fix them. If there are any pets around you, be aware of where they are. Review your medicines with your doctor. Some medicines can make you feel dizzy. This can increase your chance of falling. Ask your doctor what other things that you can do to help prevent falls. This information is not intended to replace advice given to you by your health care provider. Make sure you discuss any questions you have with your health care provider. Document Released: 02/16/2009 Document Revised: 09/28/2015 Document Reviewed: 05/27/2014 Elsevier Interactive Patient Education  2017 [employer].

## 2022-11-18 NOTE — Progress Notes (Signed)
6CIT was completed on 11/15/22 with score of 0

## 2022-11-19 NOTE — Progress Notes (Signed)
6CIT was completed on 05/17/22 with score of 0.

## 2022-11-19 NOTE — Progress Notes (Addendum)
Subjective:   Wendy Sandoval is a 82 y.o. female who presents for Medicare Annual (Subsequent) preventive examination.  Visit Complete: Virtual  I connected with  Wendy Sandoval on 11/15/2022 by a audio enabled telemedicine application and verified that I am speaking with the correct person using two identifiers.  Patient Location: Home  Provider Location: Home Office  I discussed the limitations of evaluation and management by telemedicine. The patient expressed understanding and agreed to proceed.  Patient Medicare AWV questionnaire was completed by the patient on ; I have confirmed that all information answered by patient is correct and no changes since this date.  Review of Systems     Cardiac Risk Factors include: advanced age (>81men, >15 women);hypertension     Objective:    Today's Vitals   11/15/22 1117  Weight: 155 lb (70.3 kg)  Height: 5' 1.5" (1.562 m)   Body mass index is 28.81 kg/m.     11/15/2022   11:28 AM 09/28/2022   10:34 AM 11/09/2021    3:10 PM 10/27/2020    9:27 AM 08/27/2017    9:43 AM 10/10/2016    8:52 PM 12/22/2015   11:49 AM  Advanced Directives  Does Patient Have a Medical Advance Directive? Yes No Yes Yes Yes No Yes  Type of Estate agent of [place];Living will Healthcare Power of [employer] of [place];Living will Healthcare Power of [place];Living will     Does patient want to make changes to medical advance directive?   No - Patient declined      Copy of Healthcare Power of Attorney in Chart? No - copy requested  No - copy requested No - copy requested   No - copy requested    Current Medications (verified) Outpatient Encounter Medications as of 11/15/2022  Medication Sig   amLODipine (NORVASC) 5 MG tablet TAKE 1 TABLET BY MOUTH EVERY DAY   cholecalciferol (VITAMIN D) 1000 UNITS tablet Take 1,000 Units by mouth 2 (two) times daily.    escitalopram (LEXAPRO) 10 MG tablet TAKE 1 TABLET BY MOUTH EVERY  DAY   esomeprazole (NEXIUM) 20 MG capsule Take 20 mg by mouth daily at 12 noon.   hydrochlorothiazide (HYDRODIURIL) 25 MG tablet TAKE 1 TABLET (25 MG TOTAL) BY MOUTH DAILY. *YEARLY APPOINTMENT REQUIRED FOR FUTURE REFILLS   lidocaine (LIDODERM) 5 % Place 1 patch onto the skin daily. Remove & Discard patch within 12 hours or as directed by MD   losartan (COZAAR) 50 MG tablet TAKE 1 TABLET BY MOUTH EVERY DAY   metoprolol tartrate (LOPRESSOR) 50 MG tablet TAKE 1/2 TABLET (25 MG TOTAL) BY MOUTH 2 (TWO) TIMES DAILY.   mupirocin ointment (BACTROBAN) 2 % Apply 1 Application topically daily.   ondansetron (ZOFRAN-ODT) 4 MG disintegrating tablet Take 1 tablet (4 mg total) by mouth every 8 (eight) hours as needed.   simvastatin (ZOCOR) 40 MG tablet TAKE 1 TABLET BY MOUTH EVERYDAY AT BEDTIME   traMADol (ULTRAM) 50 MG tablet Take 1 tablet (50 mg total) by mouth every 6 (six) hours as needed for up to 10 doses for severe pain.   triamcinolone cream (KENALOG) 0.1 % Apply to leg nightly to as needed   No facility-administered encounter medications on file as of 11/15/2022.    Allergies (verified) Codeine sulfate and Histamine   History: Past Medical History:  Diagnosis Date   Basal cell carcinoma 04/23/2011   upper left cheek (txpbx)   Chronic UTI (urinary tract infection)  recurrent   Clark level II melanoma (HCC) 10/09/2021   Left Elbow Post (excision)   Depression    DIVERTICULOSIS, COLON 03/17/2007   DJD (degenerative joint disease)    Esophageal stricture    Fatty liver    GERD 08/09/2008   Headache(784.0)    Hiatal hernia    HYPERCHOLESTEROLEMIA 07/07/2007   HYPERTENSION 07/07/2007   IBS (irritable bowel syndrome)    SCC (squamous cell carcinoma) 08/18/2014   right forearm (txpbx) cautery    Schatzki's ring    Squamous cell carcinoma of skin 11/29/2010   left inner shin (cx72fu) KA   TMJ syndrome    Past Surgical History:  Procedure Laterality Date   ABDOMINAL HYSTERECTOMY  1998    APPENDECTOMY     BREAST EXCISIONAL BIOPSY Left    Benign    BREAST SURGERY     milk gland   LUNG SURGERY  1983   Family History  Problem Relation Age of Onset   Heart disease Mother    Arthritis Mother    Stroke Father    Thyroid disease Father    Arthritis Other    Hyperlipidemia Other    Hypertension Other    Alcohol abuse Other    Diabetes Other    Stroke Other    Heart disease Other    Breast cancer Neg Hx    Stomach cancer Neg Hx    Colon cancer Neg Hx    Esophageal cancer Neg Hx    Social History   Socioeconomic History   Marital status: Widowed    Spouse name: Not on file   Number of children: Not on file   Years of education: Not on file   Highest education level: Not on file  Occupational History   Not on file  Tobacco Use   Smoking status: Never   Smokeless tobacco: Never  Vaping Use   Vaping status: Never Used  Substance and Sexual Activity   Alcohol use: No   Drug use: No   Sexual activity: Not on file  Other Topics Concern   Not on file  Social History Narrative   Not on file   Social Determinants of Health   Financial Resource Strain: Low Risk  (11/15/2022)   Overall Financial Resource Strain (CARDIA)    Difficulty of Paying Living Expenses: Not hard at all  Food Insecurity: No Food Insecurity (11/15/2022)   Hunger Vital Sign    Worried About Running Out of Food in the Last Year: Never true    Ran Out of Food in the Last Year: Never true  Transportation Needs: No Transportation Needs (11/15/2022)   PRAPARE - Administrator, Civil Service (Medical): No    Lack of Transportation (Non-Medical): No  Physical Activity: Inactive (11/15/2022)   Exercise Vital Sign    Days of Exercise per Week: 0 days    Minutes of Exercise per Session: 0 min  Stress: No Stress Concern Present (11/15/2022)   [employer] of Occupational Health - Occupational Stress Questionnaire    Feeling of Stress : Not at all  Social Connections: Moderately  Integrated (11/15/2022)   Social Connection and Isolation Panel [NHANES]    Frequency of Communication with Friends and Family: More than three times a week    Frequency of Social Gatherings with Friends and Family: More than three times a week    Attends Religious Services: More than 4 times per year    Active Member of [employer] or Organizations: Yes  Attends Banker Meetings: More than 4 times per year    Marital Status: Widowed    Tobacco Counseling Counseling given: Not Answered   Clinical Intake:  Pre-visit preparation completed: No  Pain : No/denies pain     BMI - recorded: 28.81 Nutritional Status: BMI 25 -29 Overweight Nutritional Risks: None Diabetes: No  How often do you need to have someone help you when you read instructions, pamphlets, or other written materials from your doctor or pharmacy?: 1 - Never  Interpreter Needed?: No  Information entered by :: Theresa Mulligan  LPN   Activities of Daily Living    11/15/2022   11:25 AM  In your present state of health, do you have any difficulty performing the following activities:  Hearing? 0  Vision? 0  Difficulty concentrating or making decisions? 0  Walking or climbing stairs? 0  Dressing or bathing? 0  Doing errands, shopping? 0  Preparing Food and eating ? N  Using the Toilet? N  In the past six months, have you accidently leaked urine? Y  Comment Wears pads. Followed by Urologist  Do you have problems with loss of bowel control? N  Managing your Medications? N  Managing your Finances? N  Housekeeping or managing your Housekeeping? N    Patient Care Team: Kristian Covey, MD as PCP - [facility] [place], Judye Bos, PA-C as Physician Assistant (Dermatology)  Indicate any recent Medical Services you may have received from other than [facility] providers in the past year (date may be approximate).     Assessment:   This is a routine wellness examination for [place].  Hearing/Vision  screen Hearing Screening - Comments:: Denies hearing difficulties   Vision Screening - Comments:: Wears rx glasses - up to date with routine eye exams with  Dr Marti Sleigh  Dietary issues and exercise activities discussed:     Goals Addressed               This Visit's Progress     Stay Healthy (pt-stated)         Depression Screen    11/15/2022   11:23 AM 10/22/2022   11:29 AM 07/12/2022    9:06 AM 11/09/2021    3:04 PM 10/05/2021    3:07 PM 10/27/2020    9:28 AM 10/27/2020    9:26 AM  PHQ 2/9 Scores  PHQ - 2 Score 0 0 0 0 0 0 0  PHQ- 9 Score 0 4 0 0 3      Fall Risk    11/15/2022   11:26 AM 11/09/2021    3:08 PM 10/05/2021    3:06 PM 10/27/2020    9:28 AM 09/22/2019    8:50 AM  Fall Risk   Falls in the past year? 1 0 1 0 0  Number falls in past yr: 0 0 0 0 0  Injury with Fall? 1 0 1 0 0  Comment Skin tear to rt eye. Followed by medical attention      Risk for fall due to : No Fall Risks No Fall Risks No Fall Risks History of fall(s)   Follow up Falls prevention discussed  Falls evaluation completed  Falls evaluation completed    MEDICARE RISK AT HOME:    TIMED UP AND GO:  Was the test performed?  No    Cognitive Function:    12/22/2015   11:54 AM  MMSE - Mini Mental State Exam  Not completed: --  11/18/2022    8:09 AM 11/15/2022    9:24 AM 11/09/2021    3:10 PM  6CIT Screen  What Year? 0 points 0 points 0 points  What month? 0 points 0 points 0 points  What time? 0 points 0 points 0 points  Count back from 20 0 points 0 points 0 points  Months in reverse 0 points 0 points 0 points  Repeat phrase 0 points 0 points 0 points  Total Score 0 points 0 points 0 points    Immunizations Immunization History  Administered Date(s) Administered   Influenza Split 03/13/2011, 02/21/2012   Influenza Whole 02/20/2009, 02/22/2013   Influenza, High Dose Seasonal PF 02/18/2014, 02/10/2017, 03/02/2018, 02/26/2019, 02/21/2020   Influenza-Unspecified 02/14/2016    Moderna Sars-Covid-2 Vaccination 07/15/2019, 08/12/2019   Pneumococcal Conjugate-13 03/13/2016   Pneumococcal Polysaccharide-23 07/04/2011   Tdap 09/28/2022   Zoster, Live 04/20/2013    Tdap: Due Education provided  Flu Vaccine status: Declined, Education has been provided regarding the importance of this vaccine but patient still declined. Advised may receive this vaccine at local pharmacy or Health Dept. Aware to provide a copy of the vaccination record if obtained from local pharmacy or Health Dept. Verbalized acceptance and understanding.  Pneumococcal vaccine status: Up to date  Covid-19 vaccine status: Completed vaccines  Qualifies for Shingles Vaccine? Yes   Zostavax completed No   Shingrix Completed?: No.    Education has been provided regarding the importance of this vaccine. Patient has been advised to call insurance company to determine out of pocket expense if they have not yet received this vaccine. Advised may also receive vaccine at local pharmacy or Health Dept. Verbalized acceptance and understanding.  Screening Tests Health Maintenance  Topic Date Due   Zoster Vaccines- Shingrix (1 of 2) 04/27/1960   DEXA SCAN  Never done   COVID-19 Vaccine (3 - Moderna risk series) 09/09/2019   INFLUENZA VACCINE  12/05/2022   Medicare Annual Wellness (AWV)  11/15/2023   DTaP/Tdap/Td (2 - Td or Tdap) 09/27/2032   Pneumonia Vaccine 51+ Years old  Completed   HPV VACCINES  Aged Out    Health Maintenance  Health Maintenance Due  Topic Date Due   Zoster Vaccines- Shingrix (1 of 2) 04/27/1960   DEXA SCAN  Never done   COVID-19 Vaccine (3 - Moderna risk series) 09/09/2019    Colorectal cancer screening: No longer required.   Mammogram status: No longer required due to Age.  Bone Density status: Ordered Deferred. Pt provided with contact info and advised to call to schedule appt.  Lung Cancer Screening: (Low Dose CT Chest recommended if Age 48-80 years, 20 pack-year currently  smoking OR have quit w/in 15years.)  qualify.     Additional Screening:  Hepatitis C Screening: does not qualify; Completed   Vision Screening: Recommended annual ophthalmology exams for early detection of glaucoma and other disorders of the eye. Is the patient up to date with their annual eye exam?  Yes  Who is the provider or what is the name of the office in which the patient attends annual eye exams? Dr Burgess Estelle If pt is not established with a provider, would they like to be referred to a provider to establish care? No .   Dental Screening: Recommended annual dental exams for proper oral hygiene   Community Resource Referral / Chronic Care Management:  CRR required this visit?  No   CCM required this visit?  No     Plan:  I have personally reviewed and noted the following in the patient's chart:   Medical and social history Use of alcohol, tobacco or illicit drugs  Current medications and supplements including opioid prescriptions. Patient is currently taking opioid prescriptions. Information provided to patient regarding non-opioid alternatives. Patient advised to discuss non-opioid treatment plan with their provider. Functional ability and status Nutritional status Physical activity Advanced directives List of other physicians Hospitalizations, surgeries, and ER visits in previous 12 months Vitals Screenings to include cognitive, depression, and falls Referrals and appointments  In addition, I have reviewed and discussed with patient certain preventive protocols, quality metrics, and best practice recommendations. A written personalized care plan for preventive services as well as general preventive health recommendations were provided to patient.     Theresa Mulligan, LPN  [id number]   After Visit Summary: (MyChart) Due to this being a telephonic visit, the after visit summary with patients personalized plan was offered to patient via MyChart   Nurse Notes:  None

## 2022-11-29 DIAGNOSIS — N3020 Other chronic cystitis without hematuria: Secondary | ICD-10-CM | POA: Diagnosis not present

## 2022-11-29 DIAGNOSIS — R8271 Bacteriuria: Secondary | ICD-10-CM | POA: Diagnosis not present

## 2022-12-09 DIAGNOSIS — Z08 Encounter for follow-up examination after completed treatment for malignant neoplasm: Secondary | ICD-10-CM | POA: Diagnosis not present

## 2022-12-09 DIAGNOSIS — Z1283 Encounter for screening for malignant neoplasm of skin: Secondary | ICD-10-CM | POA: Diagnosis not present

## 2022-12-09 DIAGNOSIS — Z85820 Personal history of malignant melanoma of skin: Secondary | ICD-10-CM | POA: Diagnosis not present

## 2022-12-09 DIAGNOSIS — D225 Melanocytic nevi of trunk: Secondary | ICD-10-CM | POA: Diagnosis not present

## 2022-12-09 DIAGNOSIS — B078 Other viral warts: Secondary | ICD-10-CM | POA: Diagnosis not present

## 2022-12-16 ENCOUNTER — Other Ambulatory Visit: Payer: Self-pay | Admitting: Family Medicine

## 2022-12-16 DIAGNOSIS — Z1231 Encounter for screening mammogram for malignant neoplasm of breast: Secondary | ICD-10-CM

## 2022-12-19 ENCOUNTER — Other Ambulatory Visit: Payer: Self-pay | Admitting: Family Medicine

## 2022-12-19 DIAGNOSIS — F339 Major depressive disorder, recurrent, unspecified: Secondary | ICD-10-CM

## 2022-12-22 ENCOUNTER — Other Ambulatory Visit: Payer: Self-pay | Admitting: Family Medicine

## 2023-01-03 ENCOUNTER — Ambulatory Visit
Admission: RE | Admit: 2023-01-03 | Discharge: 2023-01-03 | Disposition: A | Discharge status: Home / Self Care | Payer: Medicare Other | Source: Ambulatory Visit | Attending: Family Medicine | Admitting: Family Medicine

## 2023-01-03 DIAGNOSIS — Z1231 Encounter for screening mammogram for malignant neoplasm of breast: Secondary | ICD-10-CM

## 2023-01-09 ENCOUNTER — Other Ambulatory Visit: Payer: Self-pay | Admitting: Family Medicine

## 2023-01-09 DIAGNOSIS — R928 Other abnormal and inconclusive findings on diagnostic imaging of breast: Secondary | ICD-10-CM

## 2023-01-19 ENCOUNTER — Other Ambulatory Visit: Payer: Self-pay | Admitting: Family Medicine

## 2023-01-20 ENCOUNTER — Ambulatory Visit
Admission: RE | Admit: 2023-01-20 | Discharge: 2023-01-20 | Disposition: A | Discharge status: Home / Self Care | Payer: Medicare Other | Source: Ambulatory Visit | Attending: Family Medicine | Admitting: Family Medicine

## 2023-01-20 DIAGNOSIS — N6001 Solitary cyst of right breast: Secondary | ICD-10-CM | POA: Diagnosis not present

## 2023-01-20 DIAGNOSIS — R928 Other abnormal and inconclusive findings on diagnostic imaging of breast: Secondary | ICD-10-CM

## 2023-01-21 ENCOUNTER — Ambulatory Visit (INDEPENDENT_AMBULATORY_CARE_PROVIDER_SITE_OTHER): Payer: Medicare Other | Admitting: Family Medicine

## 2023-01-21 ENCOUNTER — Encounter: Payer: Self-pay | Admitting: Family Medicine

## 2023-01-21 VITALS — BP 116/62 | HR 52 | Temp 97.9°F | Ht 61.5 in | Wt 155.9 lb

## 2023-01-21 DIAGNOSIS — R911 Solitary pulmonary nodule: Secondary | ICD-10-CM | POA: Diagnosis not present

## 2023-01-21 DIAGNOSIS — E7800 Pure hypercholesterolemia, unspecified: Secondary | ICD-10-CM | POA: Diagnosis not present

## 2023-01-21 DIAGNOSIS — I8393 Asymptomatic varicose veins of bilateral lower extremities: Secondary | ICD-10-CM

## 2023-01-21 DIAGNOSIS — I10 Essential (primary) hypertension: Secondary | ICD-10-CM

## 2023-01-21 MED ORDER — TRIAMCINOLONE ACETONIDE 0.1 % EX CREA: 1.0000 | TOPICAL_CREAM | Freq: Two times a day (BID) | CUTANEOUS | 3 refills | Status: DC | PRN

## 2023-01-21 NOTE — Progress Notes (Signed)
Established Patient Office Visit  Subjective   Patient ID: Wendy Sandoval, female    DOB: 1940-08-10  Age: 82 y.o. MRN: [id number]  Chief Complaint  Patient presents with   Results   Itching     HPI   Wendy Sandoval is seen to discuss several items as follows.  This is a 89-month scheduled follow-up.  She has hypertension treated with losartan 50 mg daily, amlodipine 5 mg daily, HCTZ 25 mg daily, and metoprolol 25 mg 1/2 tablet twice daily.  Blood pressure stable.  No recent orthostasis.  Compliant with medications.  She has varicose veins lower legs and has frequent itching of her lower legs.  She is moisturizing lotions with minimal improvement.  Uses support stockings frequently.  Recent right breast mass on imaging.  She had further imaging with ultrasound which was confirmed this was a benign appearing cyst.  She does had those films yesterday  She had ER visit back in May.  She fell on her driveway.  She fell like her right lower extremity gave way.  Had right facial laceration.  Had extensive x-rays in the ER including CT head, cervical spine, CT chest abdomen and pelvis.  No acute findings.  3 mm right upper lobe nodule.  Patient is low risk.  Non-smoker.  No chronic cough.  Has hyperlipidemia takes simvastatin but is only taking half of a 40 mg tablet.  Lipids were done last March and stable.  She plans to get flu vaccine and COVID booster at her local pharmacy in the next week or so  Past Medical History:  Diagnosis Date   Basal cell carcinoma 04/23/2011   upper left cheek (txpbx)   Chronic UTI (urinary tract infection)    recurrent   Clark level II melanoma (HCC) 10/09/2021   Left Elbow Post (excision)   Depression    DIVERTICULOSIS, COLON 03/17/2007   DJD (degenerative joint disease)    Esophageal stricture    Fatty liver    GERD 08/09/2008   Headache(784.0)    Hiatal hernia    HYPERCHOLESTEROLEMIA 07/07/2007   HYPERTENSION 07/07/2007   IBS (irritable bowel  syndrome)    SCC (squamous cell carcinoma) 08/18/2014   right forearm (txpbx) cautery    Schatzki's ring    Squamous cell carcinoma of skin 11/29/2010   left inner shin (cx8fu) KA   TMJ syndrome    Past Surgical History:  Procedure Laterality Date   ABDOMINAL HYSTERECTOMY  1998   APPENDECTOMY     BREAST EXCISIONAL BIOPSY Left    Benign    BREAST SURGERY     milk gland   LUNG SURGERY  1983    reports that she has never smoked. She has never used smokeless tobacco. She reports that she does not drink alcohol and does not use drugs. family history includes Alcohol abuse in an other family member; Arthritis in her mother and another family member; Diabetes in an other family member; Heart disease in her mother and another family member; Hyperlipidemia in an other family member; Hypertension in an other family member; Stroke in her father and another family member; Thyroid disease in her father. Allergies  Allergen Reactions   Codeine Sulfate     REACTION: nausea   Histamine     Anxious feeling      Review of Systems  Constitutional:  Negative for chills, fever and malaise/fatigue.  Eyes:  Negative for blurred vision.  Respiratory:  Negative for shortness of breath.   Cardiovascular:  Negative  for chest pain.  Neurological:  Negative for dizziness, weakness and headaches.      Objective:     BP 116/62 (BP Location: Left Arm, Patient Position: Sitting, Cuff Size: Normal)   Pulse (!) 52   Temp 97.9 F (36.6 C) (Oral)   Ht 5' 1.5" (1.562 m)   Wt 155 lb 14.4 oz (70.7 kg)   SpO2 97%   BMI 28.98 kg/m  BP Readings from Last 3 Encounters:  01/21/23 116/62  10/22/22 120/60  09/28/22 (!) 144/65   Wt Readings from Last 3 Encounters:  01/21/23 155 lb 14.4 oz (70.7 kg)  11/15/22 155 lb (70.3 kg)  10/22/22 155 lb 11.2 oz (70.6 kg)      Physical Exam Vitals reviewed.  Constitutional:      Appearance: She is well-developed.  Eyes:     Pupils: Pupils are equal, round,  and reactive to light.  Neck:     Thyroid: No thyromegaly.     Vascular: No JVD.  Cardiovascular:     Rate and Rhythm: Regular rhythm.     Heart sounds:     No gallop.     Comments: Slightly bradycardic with rate around 52 Pulmonary:     Effort: Pulmonary effort is normal. No respiratory distress.     Breath sounds: Normal breath sounds. No wheezing or rales.  Musculoskeletal:     Cervical back: Neck supple.     Right lower leg: No edema.     Left lower leg: No edema.  Skin:    Comments: She has several superficial varicose veins lower legs bilaterally.  No specific areas of rash noted.  Neurological:     Mental Status: She is alert.      No results found for any visits on 01/21/23.  Last CBC Lab Results  Component Value Date   WBC 6.7 09/28/2022   HGB 13.7 09/28/2022   HCT 38.9 09/28/2022   MCV 89.8 09/28/2022   MCH 31.6 09/28/2022   RDW 12.4 09/28/2022   PLT 213 09/28/2022   Last metabolic panel Lab Results  Component Value Date   GLUCOSE 90 09/28/2022   NA 140 09/28/2022   K 3.5 09/28/2022   CL 104 09/28/2022   CO2 28 09/28/2022   BUN 12 09/28/2022   CREATININE 0.54 09/28/2022   GFRNONAA >60 09/28/2022   CALCIUM 9.3 09/28/2022   PROT 6.9 09/28/2022   ALBUMIN 4.0 09/28/2022   BILITOT 0.6 09/28/2022   ALKPHOS 54 09/28/2022   AST 13 (L) 09/28/2022   ALT 9 09/28/2022   ANIONGAP 8 09/28/2022   Last lipids Lab Results  Component Value Date   CHOL 134 07/12/2022   HDL 47.70 07/12/2022   LDLCALC 70 07/12/2022   TRIG 84.0 07/12/2022   CHOLHDL 3 07/12/2022   Last thyroid functions Lab Results  Component Value Date   TSH 1.27 07/12/2022      The ASCVD Risk score (Arnett DK, et al., 2019) failed to calculate for the following reasons:   The 2019 ASCVD risk score is only valid for ages 62 to 73    Assessment & Plan:   #1 hypertension stable and at goal.  Continue current regimen with metoprolol, losartan, HCTZ, and amlodipine.  #2 varicose veins.   She has had some associated itching.  Triamcinolone 0.1% cream to use twice daily as needed.  Continue compression garments.  #3 hyperlipidemia treated with simvastatin.  No myalgias.  Recheck fasting lipids at 35-month follow-up  #4 right upper lobe nodule  3 mm on recent imaging.  She is low risk.  No further follow-up necessary.  Evelena Peat, MD

## 2023-01-21 NOTE — Patient Instructions (Signed)
Remember to get flu vaccine this Fall.

## 2023-02-07 DIAGNOSIS — S62306A Unspecified fracture of fifth metacarpal bone, right hand, initial encounter for closed fracture: Secondary | ICD-10-CM | POA: Diagnosis not present

## 2023-02-07 DIAGNOSIS — M79641 Pain in right hand: Secondary | ICD-10-CM | POA: Diagnosis not present

## 2023-02-07 DIAGNOSIS — M19041 Primary osteoarthritis, right hand: Secondary | ICD-10-CM | POA: Diagnosis not present

## 2023-02-07 DIAGNOSIS — S60221A Contusion of right hand, initial encounter: Secondary | ICD-10-CM | POA: Diagnosis not present

## 2023-02-07 DIAGNOSIS — S62326A Displaced fracture of shaft of fifth metacarpal bone, right hand, initial encounter for closed fracture: Secondary | ICD-10-CM | POA: Diagnosis not present

## 2023-02-11 DIAGNOSIS — S62356A Nondisplaced fracture of shaft of fifth metacarpal bone, right hand, initial encounter for closed fracture: Secondary | ICD-10-CM | POA: Diagnosis not present

## 2023-02-17 ENCOUNTER — Ambulatory Visit: Payer: Medicare Other | Admitting: Family Medicine

## 2023-02-17 VITALS — BP 126/60 | HR 58 | Temp 98.3°F | Ht 61.5 in | Wt 155.6 lb

## 2023-02-17 DIAGNOSIS — R4182 Altered mental status, unspecified: Secondary | ICD-10-CM

## 2023-02-17 DIAGNOSIS — S62356A Nondisplaced fracture of shaft of fifth metacarpal bone, right hand, initial encounter for closed fracture: Secondary | ICD-10-CM | POA: Diagnosis not present

## 2023-02-17 DIAGNOSIS — I10 Essential (primary) hypertension: Secondary | ICD-10-CM

## 2023-02-17 NOTE — Progress Notes (Signed)
Established Patient Office Visit  Subjective   Patient ID: Wendy Sandoval, female    DOB: 18-Jun-1940  Age: 82 y.o. MRN: [id number]  Chief Complaint  Patient presents with   Memory Loss    HPI   Wendy Sandoval has history of hypertension, GERD, IBS, melanoma, recurrent depression, hyperlipidemia.  Wendy Sandoval is seen accompanied by Wendy Sandoval today with concerns of some recent acute mental status changes.  Wendy Sandoval had a fall about 10 days ago and fractured her right fifth metacarpal.  Has seen orthopedist.  Taking Tylenol but no pain medications otherwise since her fall.  No head injury reported.  No recent fever.  Wendy Sandoval has some chronic occasional burning with urination but no real acute changes.  No gross hematuria.  Denies any headache, cough, chest pain, pleuritic pain, abdominal pain.  Wendy Sandoval noticed little over a week ago that Wendy Sandoval was having some acute confusion.  For example, Wendy Sandoval could not remember name of couple grandchildren.  Wendy Sandoval also is having trouble remembering details related to her fall.  Again, denied any head injury was so ever with recent fall.  Wendy Sandoval had CT of the head done back in May which showed no acute abnormalities.  There was reported age related atrophy and chronic small vessel ischemic changes then.  No recent reported speech changes or focal weakness.  He is still in driving and this included appointment with orthopedist this morning  Past Medical History:  Diagnosis Date   Basal cell carcinoma 04/23/2011   upper left cheek (txpbx)   Chronic UTI (urinary tract infection)    recurrent   Clark level II melanoma (HCC) 10/09/2021   Left Elbow Post (excision)   Depression    DIVERTICULOSIS, COLON 03/17/2007   DJD (degenerative joint disease)    Esophageal stricture    Fatty liver    GERD 08/09/2008   Headache(784.0)    Hiatal hernia    HYPERCHOLESTEROLEMIA 07/07/2007   HYPERTENSION 07/07/2007   IBS (irritable bowel syndrome)    SCC (squamous cell carcinoma) 08/18/2014    right forearm (txpbx) cautery    Schatzki's ring    Squamous cell carcinoma of skin 11/29/2010   left inner shin (cx28fu) KA   TMJ syndrome    Past Surgical History:  Procedure Laterality Date   ABDOMINAL HYSTERECTOMY  1998   APPENDECTOMY     BREAST EXCISIONAL BIOPSY Left    Benign    BREAST SURGERY     milk gland   LUNG SURGERY  1983    reports that Wendy Sandoval has never smoked. Wendy Sandoval has never used smokeless tobacco. Wendy Sandoval reports that Wendy Sandoval does not drink alcohol and does not use drugs. family history includes Alcohol abuse in an other family member; Arthritis in her mother and another family member; Diabetes in an other family member; Heart disease in her mother and another family member; Hyperlipidemia in an other family member; Hypertension in an other family member; Stroke in her father and another family member; Thyroid disease in her father. Allergies  Allergen Reactions   Codeine Sulfate     REACTION: nausea   Histamine     Anxious feeling    Review of Systems  Constitutional:  Negative for chills, fever and malaise/fatigue.  Eyes:  Negative for blurred vision.  Respiratory:  Negative for shortness of breath.   Cardiovascular:  Negative for chest pain.  Gastrointestinal:  Negative for abdominal pain, diarrhea, nausea and vomiting.  Genitourinary:  Negative for flank pain, frequency and hematuria.  Neurological:  Negative  for dizziness, weakness and headaches.  Psychiatric/Behavioral:  Negative for depression and hallucinations.       Objective:     BP 126/60 (BP Location: Left Arm, Patient Position: Sitting, Cuff Size: Normal)   Pulse (!) 58   Temp 98.3 F (36.8 C) (Oral)   Ht 5' 1.5" (1.562 m)   Wt 155 lb 9.6 oz (70.6 kg)   SpO2 97%   BMI 28.92 kg/m  BP Readings from Last 3 Encounters:  02/17/23 126/60  01/21/23 116/62  10/22/22 120/60   Wt Readings from Last 3 Encounters:  02/17/23 155 lb 9.6 oz (70.6 kg)  01/21/23 155 lb 14.4 oz (70.7 kg)  11/15/22 155 lb  (70.3 kg)      Physical Exam Vitals reviewed.  Constitutional:      General: Wendy Sandoval is not in acute distress.    Appearance: Normal appearance. Wendy Sandoval is not ill-appearing.  Eyes:     Extraocular Movements: Extraocular movements intact.     Pupils: Pupils are equal, round, and reactive to light.  Cardiovascular:     Rate and Rhythm: Normal rate and regular rhythm.  Pulmonary:     Effort: Pulmonary effort is normal.     Breath sounds: Normal breath sounds.  Neurological:     General: No focal deficit present.     Mental Status: Wendy Sandoval is alert.     Cranial Nerves: No cranial nerve deficit.     Motor: No weakness.     Coordination: Coordination normal.      No results found for any visits on 02/17/23.  Last CBC Lab Results  Component Value Date   WBC 6.7 09/28/2022   HGB 13.7 09/28/2022   HCT 38.9 09/28/2022   MCV 89.8 09/28/2022   MCH 31.6 09/28/2022   RDW 12.4 09/28/2022   PLT 213 09/28/2022   Last metabolic panel Lab Results  Component Value Date   GLUCOSE 90 09/28/2022   NA 140 09/28/2022   K 3.5 09/28/2022   CL 104 09/28/2022   CO2 28 09/28/2022   BUN 12 09/28/2022   CREATININE 0.54 09/28/2022   GFRNONAA >60 09/28/2022   CALCIUM 9.3 09/28/2022   PROT 6.9 09/28/2022   ALBUMIN 4.0 09/28/2022   BILITOT 0.6 09/28/2022   ALKPHOS 54 09/28/2022   AST 13 (L) 09/28/2022   ALT 9 09/28/2022   ANIONGAP 8 09/28/2022   Last thyroid functions Lab Results  Component Value Date   TSH 1.27 07/12/2022      The ASCVD Risk score (Arnett DK, et al., 2019) failed to calculate for the following reasons:   The 2019 ASCVD risk score is only valid for ages 83 to 62    Assessment & Plan:   Problem List Items Addressed This Visit   None Visit Diagnoses     Acute alteration in mental status    -  Primary   Relevant Orders   CMP   CBC with Differential/Platelet   Urinalysis with Culture Reflex     Patient had acute worsening of mental status floor over a week ago.  Did  have recent fall with right fifth metacarpal fracture but no head injury with that fall.  Wendy Sandoval seems to be clearing some at this time.  Etiology unclear.  We did discuss possibilities such as small vessel CVA versus occult infection such as UTI versus other.  -Wendy Sandoval scored 29 out of 30 today on MMSE.  No problems whatsoever with clock drawing.  -Obtain labs including CBC, CMP, urinalysis with reflex  to culture if indicated -Did discuss possible MRI scan but at this point Wendy Sandoval seems to be much clear.  Family be in touch immediately for any recurrent confusion or other concerns  No follow-ups on file.    Evelena Peat, MD

## 2023-02-17 NOTE — Patient Instructions (Signed)
Set up labs for Wednesday.

## 2023-02-19 ENCOUNTER — Other Ambulatory Visit (INDEPENDENT_AMBULATORY_CARE_PROVIDER_SITE_OTHER): Payer: Medicare Other

## 2023-02-19 DIAGNOSIS — R4182 Altered mental status, unspecified: Secondary | ICD-10-CM

## 2023-02-20 LAB — CBC WITH DIFFERENTIAL/PLATELET
Basophils Absolute: 0.1 10*3/uL (ref 0.0–0.1)
Basophils Relative: 0.9 % (ref 0.0–3.0)
Eosinophils Absolute: 0.2 10*3/uL (ref 0.0–0.7)
Eosinophils Relative: 2.9 % (ref 0.0–5.0)
HCT: 40.5 % (ref 36.0–46.0)
Hemoglobin: 13.5 g/dL (ref 12.0–15.0)
Lymphocytes Relative: 33.5 % (ref 12.0–46.0)
Lymphs Abs: 2.1 10*3/uL (ref 0.7–4.0)
MCHC: 33.3 g/dL (ref 30.0–36.0)
MCV: 94.2 fL (ref 78.0–100.0)
Monocytes Absolute: 0.4 10*3/uL (ref 0.1–1.0)
Monocytes Relative: 5.7 % (ref 3.0–12.0)
Neutro Abs: 3.7 10*3/uL (ref 1.4–7.7)
Neutrophils Relative %: 57 % (ref 43.0–77.0)
Platelets: 255 10*3/uL (ref 150.0–400.0)
RBC: 4.3 Mil/uL (ref 3.87–5.11)
RDW: 12.8 % (ref 11.5–15.5)
WBC: 6.4 10*3/uL (ref 4.0–10.5)

## 2023-02-20 LAB — COMPREHENSIVE METABOLIC PANEL
ALT: 10 U/L (ref 0–35)
AST: 15 U/L (ref 0–37)
Albumin: 3.8 g/dL (ref 3.5–5.2)
Alkaline Phosphatase: 58 U/L (ref 39–117)
BUN: 14 mg/dL (ref 6–23)
CO2: 30 meq/L (ref 19–32)
Calcium: 9.2 mg/dL (ref 8.4–10.5)
Chloride: 102 meq/L (ref 96–112)
Creatinine, Ser: 0.73 mg/dL (ref 0.40–1.20)
GFR: 75.84 mL/min (ref >60.00)
Glucose, Bld: 78 mg/dL (ref 70–99)
Potassium: 3.7 meq/L (ref 3.5–5.1)
Sodium: 139 meq/L (ref 135–145)
Total Bilirubin: 0.4 mg/dL (ref 0.2–1.2)
Total Protein: 6.5 g/dL (ref 6.0–8.3)

## 2023-02-24 DIAGNOSIS — S62356A Nondisplaced fracture of shaft of fifth metacarpal bone, right hand, initial encounter for closed fracture: Secondary | ICD-10-CM | POA: Diagnosis not present

## 2023-03-10 DIAGNOSIS — S62356A Nondisplaced fracture of shaft of fifth metacarpal bone, right hand, initial encounter for closed fracture: Secondary | ICD-10-CM | POA: Diagnosis not present

## 2023-03-16 ENCOUNTER — Other Ambulatory Visit: Payer: Self-pay | Admitting: Family Medicine

## 2023-05-30 ENCOUNTER — Ambulatory Visit: Payer: Medicare Other

## 2023-05-30 ENCOUNTER — Ambulatory Visit: Payer: Self-pay | Admitting: Family Medicine

## 2023-05-30 ENCOUNTER — Ambulatory Visit (INDEPENDENT_AMBULATORY_CARE_PROVIDER_SITE_OTHER): Payer: Medicare Other | Admitting: Family Medicine

## 2023-05-30 VITALS — BP 140/70 | HR 50 | Temp 97.4°F | Wt 157.6 lb

## 2023-05-30 DIAGNOSIS — R0609 Other forms of dyspnea: Secondary | ICD-10-CM | POA: Diagnosis not present

## 2023-05-30 DIAGNOSIS — I517 Cardiomegaly: Secondary | ICD-10-CM | POA: Diagnosis not present

## 2023-05-30 DIAGNOSIS — I10 Essential (primary) hypertension: Secondary | ICD-10-CM

## 2023-05-30 DIAGNOSIS — R918 Other nonspecific abnormal finding of lung field: Secondary | ICD-10-CM | POA: Diagnosis not present

## 2023-05-30 NOTE — Telephone Encounter (Signed)
  Chief Complaint: SOB with exertion Symptoms: asymptomatic at this time, but mild SOB with prolonged activity Frequency: Several months Pertinent Negatives: Patient denies symptoms at this time, CP, dizziness during activity Disposition: [] ED /[] Urgent Care (no appt availability in office) / [x] Appointment(In office/virtual)/ []  [facility]/ [] Home Care/ [] Refused Recommended Disposition /[]  Mobile Bus/ []  Follow-up with PCP Additional Notes: Patient was transferred to this RN for triage for SOB. Patient reports she only experiences SOB with prolonged activity, and is not experiencing SOB at this time. Per protocol, patient to be evaluated within 2 weeks, patient scheduled to see PCP for today. Care advice reviewed, patient verbalized understanding. CAL notified appointment is appropriate for complaint. Alerting PCP for review.   Reason for Disposition  [1] MILD longstanding difficulty breathing AND [2]  SAME as normal  Answer Assessment - Initial Assessment Questions 1. RESPIRATORY STATUS: "Describe your breathing?" (e.g., wheezing, shortness of breath, unable to speak, severe coughing)      States with activity, walking to and from mailbox up the hill, she experiences mild SOB, states it is short distance. 2. ONSET: "When did this breathing problem begin?"      Several months 3. PATTERN "Does the difficult breathing come and go, or has it been constant since it started?"      Only with activity 4. SEVERITY: "How bad is your breathing?" (e.g., mild, moderate, severe)    - MILD: No SOB at rest, mild SOB with walking, speaks normally in sentences, can lie down, no retractions, pulse < 100.    - MODERATE: SOB at rest, SOB with minimal exertion and prefers to sit, cannot lie down flat, speaks in phrases, mild retractions, audible wheezing, pulse 100-120.    - SEVERE: Very SOB at rest, speaks in single words, struggling to breathe, sitting hunched forward, retractions, pulse  > 120      Denies difficulty at this time. 5. RECURRENT SYMPTOM: "Have you had difficulty breathing before?" If Yes, ask: "When was the last time?" and "What happened that time?"      States it has been going on a while. 6. CARDIAC HISTORY: "Do you have any history of heart disease?" (e.g., heart attack, angina, bypass surgery, angioplasty)      Denies 7. LUNG HISTORY: "Do you have any history of lung disease?"  (e.g., pulmonary embolus, asthma, emphysema)     Denies 8. CAUSE: "What do you think is causing the breathing problem?"      Age 83. OTHER SYMPTOMS: "Do you have any other symptoms? (e.g., dizziness, runny nose, cough, chest pain, fever)     Denies 10. O2 SATURATION MONITOR:  "Do you use an oxygen saturation monitor (pulse oximeter) at home?" If Yes, ask: "What is your reading (oxygen level) today?" "What is your usual oxygen saturation reading?" (e.g., 95%)       States she does not have an oxygen probe at home.  Protocols used: Breathing Difficulty-A-AH

## 2023-05-30 NOTE — Patient Instructions (Signed)
Will call after labs and CXR back  Suspect will recommend echocardiogram of heart to further assess.

## 2023-05-30 NOTE — Progress Notes (Signed)
Established Patient Office Visit  Subjective   Patient ID: Wendy Sandoval, female    DOB: 06/10/1940  Age: 83 y.o. MRN: [id number]  Chief Complaint  Patient presents with   Shortness of Breath    Patient complains of shortness of breath upon exertion, x2 months,     HPI   Wendy Sandoval is seen with shortness of breath with exertion especially past couple months and possibly longer.  She has particularly noted this when walking back from her mailbox to her house which is about 50 yards and uphill.  No chest pain.  Fairly sedentary.  No peripheral edema.  Has had some persistent dry cough for several weeks.  She had CT chest back in May 2024 which showed coronary artery calcifications and 3 mm right upper lobe nodule.  She states she had lung biopsy age 49 which showed scar tissue.  Smoked only very briefly back in her teens.  Denies any recent orthopnea.  Medications are reviewed with no significant recent changes.  She had labs back in October with CBC and CMP which were all stable.  No anemia at that time.  Past Medical History:  Diagnosis Date   Basal cell carcinoma 04/23/2011   upper left cheek (txpbx)   Chronic UTI (urinary tract infection)    recurrent   Clark level II melanoma (HCC) 10/09/2021   Left Elbow Post (excision)   Depression    DIVERTICULOSIS, COLON 03/17/2007   DJD (degenerative joint disease)    Esophageal stricture    Fatty liver    GERD 08/09/2008   Headache(784.0)    Hiatal hernia    HYPERCHOLESTEROLEMIA 07/07/2007   HYPERTENSION 07/07/2007   IBS (irritable bowel syndrome)    SCC (squamous cell carcinoma) 08/18/2014   right forearm (txpbx) cautery    Schatzki's ring    Squamous cell carcinoma of skin 11/29/2010   left inner shin (cx64fu) KA   TMJ syndrome    Past Surgical History:  Procedure Laterality Date   ABDOMINAL HYSTERECTOMY  1998   APPENDECTOMY     BREAST EXCISIONAL BIOPSY Left    Benign    BREAST SURGERY     milk gland   LUNG SURGERY   1983    reports that she has never smoked. She has never used smokeless tobacco. She reports that she does not drink alcohol and does not use drugs. family history includes Alcohol abuse in an other family member; Arthritis in her mother and another family member; Diabetes in an other family member; Heart disease in her mother and another family member; Hyperlipidemia in an other family member; Hypertension in an other family member; Stroke in her father and another family member; Thyroid disease in her father. Allergies  Allergen Reactions   Codeine Sulfate     REACTION: nausea   Histamine     Anxious feeling    Review of Systems  Constitutional:  Negative for chills and fever.  Respiratory:  Positive for cough and shortness of breath. Negative for hemoptysis, sputum production and wheezing.   Cardiovascular:  Negative for chest pain, palpitations and leg swelling.      Objective:     BP (!) 140/70 (BP Location: Left Arm, Patient Position: Sitting, Cuff Size: Normal)   Pulse (!) 50   Temp (!) 97.4 F (36.3 C) (Oral)   Wt 157 lb 9.6 oz (71.5 kg)   SpO2 98%   BMI 29.30 kg/m  BP Readings from Last 3 Encounters:  05/30/23 (!) 140/70  02/17/23  126/60  01/21/23 116/62   Wt Readings from Last 3 Encounters:  05/30/23 157 lb 9.6 oz (71.5 kg)  02/17/23 155 lb 9.6 oz (70.6 kg)  01/21/23 155 lb 14.4 oz (70.7 kg)      Physical Exam Vitals reviewed.  Constitutional:      General: She is not in acute distress.    Appearance: She is not ill-appearing.  Eyes:     Comments: Conjunctive are slightly pale  Neck:     Vascular: No hepatojugular reflux.  Cardiovascular:     Rate and Rhythm: Normal rate and regular rhythm.     Heart sounds:     No gallop.  Pulmonary:     Breath sounds: Normal breath sounds. No decreased breath sounds, wheezing or rales.  Musculoskeletal:     Cervical back: Neck supple.     Right lower leg: No edema.     Left lower leg: No edema.  Skin:     Comments: Nail beds are slightly pale  Neurological:     Mental Status: She is alert.      No results found for any visits on 05/30/23.  Last CBC Lab Results  Component Value Date   WBC 6.4 02/19/2023   HGB 13.5 02/19/2023   HCT 40.5 02/19/2023   MCV 94.2 02/19/2023   MCH 31.6 09/28/2022   RDW 12.8 02/19/2023   PLT 255.0 02/19/2023   Last metabolic panel Lab Results  Component Value Date   GLUCOSE 78 02/19/2023   NA 139 02/19/2023   K 3.7 02/19/2023   CL 102 02/19/2023   CO2 30 02/19/2023   BUN 14 02/19/2023   CREATININE 0.73 02/19/2023   GFR 75.84 02/19/2023   CALCIUM 9.2 02/19/2023   PROT 6.5 02/19/2023   ALBUMIN 3.8 02/19/2023   BILITOT 0.4 02/19/2023   ALKPHOS 58 02/19/2023   AST 15 02/19/2023   ALT 10 02/19/2023   ANIONGAP 8 09/28/2022      The ASCVD Risk score (Arnett DK, et al., 2019) failed to calculate for the following reasons:   The 2019 ASCVD risk score is only valid for ages 83 to 62    Assessment & Plan:   Problem List Items Addressed This Visit   None Visit Diagnoses       Exertional dyspnea    -  Primary   Relevant Orders   DG Chest 2 View   EKG 12-Lead (Completed)   CBC with Differential/Platelet   Basic metabolic panel   Brain Natriuretic Peptide     83 year old female with 2 to 41-month history of some increased dyspnea with exertion but no chest pain.  No chronic lung issues.  No history of known cardiac disease.  No evidence for volume overload on exam.  Has had some nonspecific dry cough for the past several weeks.  CT chest back in May as above.  -Obtain labs with CBC, BMP, BNP -EKG shows sinus bradycardia with heart rate around 48 with mild irregularity. -Obtain PA and lateral chest x-ray -Consider echocardiogram but will wait on above labs and chest x-ray first  No follow-ups on file.    Wendy Peat, MD

## 2023-05-31 LAB — CBC WITH DIFFERENTIAL/PLATELET
Absolute Lymphocytes: 2356 {cells}/uL (ref 850–3900)
Absolute Monocytes: 508 {cells}/uL (ref 200–950)
Basophils Absolute: 40 {cells}/uL (ref 0–200)
Basophils Relative: 0.6 %
Eosinophils Absolute: 198 {cells}/uL (ref 15–500)
Eosinophils Relative: 3 %
HCT: 41.6 % (ref 35.0–45.0)
Hemoglobin: 13.9 g/dL (ref 11.7–15.5)
MCH: 31.6 pg (ref 27.0–33.0)
MCHC: 33.4 g/dL (ref 32.0–36.0)
MCV: 94.5 fL (ref 80.0–100.0)
MPV: 11.2 fL (ref 7.5–12.5)
Monocytes Relative: 7.7 %
Neutro Abs: 3498 {cells}/uL (ref 1500–7800)
Neutrophils Relative %: 53 %
Platelets: 251 10*3/uL (ref 140–400)
RBC: 4.4 10*6/uL (ref 3.80–5.10)
RDW: 12.4 % (ref 11.0–15.0)
Total Lymphocyte: 35.7 %
WBC: 6.6 10*3/uL (ref 3.8–10.8)

## 2023-05-31 LAB — BASIC METABOLIC PANEL
BUN: 20 mg/dL (ref 7–25)
CO2: 27 mmol/L (ref 20–32)
Calcium: 9.2 mg/dL (ref 8.6–10.4)
Chloride: 100 mmol/L (ref 98–110)
Creat: 0.77 mg/dL (ref 0.60–0.95)
Glucose, Bld: 108 mg/dL — ABNORMAL HIGH (ref 65–99)
Potassium: 3.8 mmol/L (ref 3.5–5.3)
Sodium: 138 mmol/L (ref 135–146)

## 2023-05-31 LAB — BRAIN NATRIURETIC PEPTIDE: Brain Natriuretic Peptide: 219 pg/mL — ABNORMAL HIGH (ref <100)

## 2023-05-31 NOTE — Addendum Note (Signed)
Addended by: Kristian Covey on: 05/31/2023 09:48 PM   Modules accepted: Orders

## 2023-06-13 ENCOUNTER — Other Ambulatory Visit: Payer: Self-pay | Admitting: Family Medicine

## 2023-06-13 DIAGNOSIS — H0100B Unspecified blepharitis left eye, upper and lower eyelids: Secondary | ICD-10-CM | POA: Diagnosis not present

## 2023-06-13 DIAGNOSIS — H52203 Unspecified astigmatism, bilateral: Secondary | ICD-10-CM | POA: Diagnosis not present

## 2023-06-13 DIAGNOSIS — H43813 Vitreous degeneration, bilateral: Secondary | ICD-10-CM | POA: Diagnosis not present

## 2023-06-13 DIAGNOSIS — H0100A Unspecified blepharitis right eye, upper and lower eyelids: Secondary | ICD-10-CM | POA: Diagnosis not present

## 2023-06-13 DIAGNOSIS — H26492 Other secondary cataract, left eye: Secondary | ICD-10-CM | POA: Diagnosis not present

## 2023-06-13 DIAGNOSIS — H04123 Dry eye syndrome of bilateral lacrimal glands: Secondary | ICD-10-CM | POA: Diagnosis not present

## 2023-06-16 DIAGNOSIS — D225 Melanocytic nevi of trunk: Secondary | ICD-10-CM | POA: Diagnosis not present

## 2023-06-16 DIAGNOSIS — L570 Actinic keratosis: Secondary | ICD-10-CM | POA: Diagnosis not present

## 2023-06-16 DIAGNOSIS — Z1283 Encounter for screening for malignant neoplasm of skin: Secondary | ICD-10-CM | POA: Diagnosis not present

## 2023-06-16 DIAGNOSIS — X32XXXA Exposure to sunlight, initial encounter: Secondary | ICD-10-CM | POA: Diagnosis not present

## 2023-06-16 DIAGNOSIS — Z85820 Personal history of malignant melanoma of skin: Secondary | ICD-10-CM | POA: Diagnosis not present

## 2023-06-16 DIAGNOSIS — Z08 Encounter for follow-up examination after completed treatment for malignant neoplasm: Secondary | ICD-10-CM | POA: Diagnosis not present

## 2023-06-20 ENCOUNTER — Ambulatory Visit (INDEPENDENT_AMBULATORY_CARE_PROVIDER_SITE_OTHER): Payer: Medicare Other

## 2023-06-20 DIAGNOSIS — R0609 Other forms of dyspnea: Secondary | ICD-10-CM | POA: Diagnosis not present

## 2023-06-20 LAB — ECHOCARDIOGRAM COMPLETE
AR max vel: 1.64 cm2
AV Area VTI: 1.67 cm2
AV Area mean vel: 1.68 cm2
AV Mean grad: 5 mm[Hg]
AV Peak grad: 9.9 mm[Hg]
Ao pk vel: 1.57 m/s
Area-P 1/2: 3.13 cm2
S' Lateral: 2.57 cm

## 2023-06-29 ENCOUNTER — Other Ambulatory Visit: Payer: Self-pay | Admitting: Family Medicine

## 2023-06-29 DIAGNOSIS — F339 Major depressive disorder, recurrent, unspecified: Secondary | ICD-10-CM

## 2023-07-11 ENCOUNTER — Other Ambulatory Visit: Payer: Self-pay | Admitting: Family Medicine

## 2023-07-13 ENCOUNTER — Other Ambulatory Visit: Payer: Self-pay | Admitting: Family Medicine

## 2023-07-21 ENCOUNTER — Encounter: Payer: Self-pay | Admitting: Family Medicine

## 2023-07-21 ENCOUNTER — Ambulatory Visit (INDEPENDENT_AMBULATORY_CARE_PROVIDER_SITE_OTHER): Payer: Medicare Other | Admitting: Family Medicine

## 2023-07-21 VITALS — BP 140/74 | HR 49 | Temp 97.9°F | Wt 158.1 lb

## 2023-07-21 DIAGNOSIS — I10 Essential (primary) hypertension: Secondary | ICD-10-CM

## 2023-07-21 DIAGNOSIS — E7800 Pure hypercholesterolemia, unspecified: Secondary | ICD-10-CM | POA: Diagnosis not present

## 2023-07-21 DIAGNOSIS — I517 Cardiomegaly: Secondary | ICD-10-CM

## 2023-07-21 LAB — LIPID PANEL
Cholesterol: 127 mg/dL (ref 0–200)
HDL: 46.5 mg/dL (ref >39.00)
LDL Cholesterol: 60 mg/dL (ref 0–99)
NonHDL: 80.36
Total CHOL/HDL Ratio: 3
Triglycerides: 100 mg/dL (ref 0.0–149.0)
VLDL: 20 mg/dL (ref 0.0–40.0)

## 2023-07-21 NOTE — Progress Notes (Signed)
 Established Patient Office Visit  Subjective   Patient ID: Wendy Sandoval, female    DOB: 1940-06-01  Age: 83 y.o. MRN: [id number]  Chief Complaint  Patient presents with   Medical Management of Chronic Issues    HPI   Felma is seen for medical follow-up.  She has history of hypertension, fatty liver changes, GERD, IBS, melanoma, recurrent depression.  She has had some chronic dyspnea and had previous evaluation with echocardiogram in February.  She had normal ejection fraction.  No major valve abnormalities.  Mild asymmetric LVH of basal segment.  She is on metoprolol.  Denies any recent chest pains.  No significant dizziness.  Her dyspnea is relatively mild and not impacting her day-to-day activities much. Has a tendency to have some ankle edema but no orthopnea.  No progressive edema.  She takes simvastatin for hyperlipidemia.  Due for follow-up lipids.  She had recent chemistries with normal renal function.  Past Medical History:  Diagnosis Date   Basal cell carcinoma 04/23/2011   upper left cheek (txpbx)   Chronic UTI (urinary tract infection)    recurrent   Clark level II melanoma (HCC) 10/09/2021   Left Elbow Post (excision)   Depression    DIVERTICULOSIS, COLON 03/17/2007   DJD (degenerative joint disease)    Esophageal stricture    Fatty liver    GERD 08/09/2008   Headache(784.0)    Hiatal hernia    HYPERCHOLESTEROLEMIA 07/07/2007   HYPERTENSION 07/07/2007   IBS (irritable bowel syndrome)    SCC (squamous cell carcinoma) 08/18/2014   right forearm (txpbx) cautery    Schatzki's ring    Squamous cell carcinoma of skin 11/29/2010   left inner shin (cx21fu) KA   TMJ syndrome    Past Surgical History:  Procedure Laterality Date   ABDOMINAL HYSTERECTOMY  1998   APPENDECTOMY     BREAST EXCISIONAL BIOPSY Left    Benign    BREAST SURGERY     milk gland   LUNG SURGERY  1983    reports that she has never smoked. She has never used smokeless tobacco. She  reports that she does not drink alcohol and does not use drugs. family history includes Alcohol abuse in an other family member; Arthritis in her mother and another family member; Diabetes in an other family member; Heart disease in her mother and another family member; Hyperlipidemia in an other family member; Hypertension in an other family member; Stroke in her father and another family member; Thyroid disease in her father. Allergies  Allergen Reactions   Codeine Sulfate     REACTION: nausea   Histamine     Anxious feeling    Review of Systems  Constitutional:  Negative for malaise/fatigue.  Eyes:  Negative for blurred vision.  Respiratory:  Negative for shortness of breath.   Cardiovascular:  Negative for chest pain.  Neurological:  Negative for dizziness, weakness and headaches.      Objective:     BP (!) 140/74 (BP Location: Left Arm, Patient Position: Sitting, Cuff Size: Normal)   Pulse (!) 49   Temp 97.9 F (36.6 C) (Oral)   Wt 158 lb 1.6 oz (71.7 kg)   SpO2 97%   BMI 29.39 kg/m  BP Readings from Last 3 Encounters:  07/21/23 (!) 140/74  05/30/23 (!) 140/70  02/17/23 126/60   Wt Readings from Last 3 Encounters:  07/21/23 158 lb 1.6 oz (71.7 kg)  05/30/23 157 lb 9.6 oz (71.5 kg)  02/17/23 155 lb  9.6 oz (70.6 kg)      Physical Exam Vitals reviewed.  Constitutional:      Appearance: She is well-developed.  Eyes:     Pupils: Pupils are equal, round, and reactive to light.  Neck:     Thyroid: No thyromegaly.     Vascular: No JVD.  Cardiovascular:     Rate and Rhythm: Normal rate and regular rhythm.     Heart sounds:     No gallop.  Pulmonary:     Effort: Pulmonary effort is normal. No respiratory distress.     Breath sounds: Normal breath sounds. No wheezing or rales.  Musculoskeletal:     Cervical back: Neck supple.  Neurological:     Mental Status: She is alert.      No results found for any visits on 07/21/23.  Last CBC Lab Results   Component Value Date   WBC 6.6 05/30/2023   HGB 13.9 05/30/2023   HCT 41.6 05/30/2023   MCV 94.5 05/30/2023   MCH 31.6 05/30/2023   RDW 12.4 05/30/2023   PLT 251 05/30/2023   Last metabolic panel Lab Results  Component Value Date   GLUCOSE 108 (H) 05/30/2023   NA 138 05/30/2023   K 3.8 05/30/2023   CL 100 05/30/2023   CO2 27 05/30/2023   BUN 20 05/30/2023   CREATININE 0.77 05/30/2023   GFR 75.84 02/19/2023   CALCIUM 9.2 05/30/2023   PROT 6.5 02/19/2023   ALBUMIN 3.8 02/19/2023   BILITOT 0.4 02/19/2023   ALKPHOS 58 02/19/2023   AST 15 02/19/2023   ALT 10 02/19/2023   ANIONGAP 8 09/28/2022   Last lipids Lab Results  Component Value Date   CHOL 134 07/12/2022   HDL 47.70 07/12/2022   LDLCALC 70 07/12/2022   TRIG 84.0 07/12/2022   CHOLHDL 3 07/12/2022      The ASCVD Risk score (Arnett DK, et al., 2019) failed to calculate for the following reasons:   The 2019 ASCVD risk score is only valid for ages 10 to 38    Assessment & Plan:   #1 hyperlipidemia.  Treated with simvastatin.  Recent liver panel normal.  Due for follow-up lipids.  Fasting lipid panel ordered  #2 hypertension.  Blood pressures reasonably stable.  Recent echo did show some mild asymmetric hypertrophy basal segment.  If any progressive shortness of breath consider discontinuation of diuretic.  She is already on metoprolol and would certainly continue that  #3 mild asymmetric basal septal hypertrophy.  Difficult to say whether this is contributing to her shortness of breath versus deconditioning and other factors.  She does not have any significant dizziness.  No history of syncope.  No chest pain.  Symptoms are not limiting in a major way.  Continue metoprolol.  We did discuss possible referral to cardiology but at this point she feels like this is not impacting her life greatly and will continue to observe  Return in about 6 months (around 01/21/2024).    Evelena Peat, MD

## 2023-09-07 ENCOUNTER — Other Ambulatory Visit: Payer: Self-pay | Admitting: Family Medicine

## 2023-10-01 ENCOUNTER — Encounter: Payer: Self-pay | Admitting: Family Medicine

## 2023-10-01 ENCOUNTER — Ambulatory Visit (INDEPENDENT_AMBULATORY_CARE_PROVIDER_SITE_OTHER): Admitting: Family Medicine

## 2023-10-01 VITALS — BP 146/70 | HR 56 | Temp 97.9°F | Wt 159.6 lb

## 2023-10-01 DIAGNOSIS — R300 Dysuria: Secondary | ICD-10-CM

## 2023-10-01 DIAGNOSIS — N3946 Mixed incontinence: Secondary | ICD-10-CM

## 2023-10-01 DIAGNOSIS — M533 Sacrococcygeal disorders, not elsewhere classified: Secondary | ICD-10-CM | POA: Diagnosis not present

## 2023-10-01 DIAGNOSIS — M170 Bilateral primary osteoarthritis of knee: Secondary | ICD-10-CM

## 2023-10-01 MED ORDER — GEMTESA 75 MG PO TABS: 75.0000 mg | ORAL_TABLET | Freq: Every day | ORAL | 5 refills | Status: AC

## 2023-10-01 NOTE — Patient Instructions (Signed)
 Let me know in one month if urine symptoms not improved on the Gemtesa.  Try to gradually scale back the caffeine  Increase stationary cycling, as discussed.

## 2023-10-01 NOTE — Progress Notes (Signed)
 Established Patient Office Visit  Subjective   Patient ID: Wendy Sandoval, female    DOB: 11-Feb-1941  Age: 83 y.o. MRN: [id number]  Chief Complaint  Patient presents with   Flank Pain   Urinary Incontinence   Knee Pain    HPI   Wendy Sandoval is seen for several different items today as follows  She has had some urine incontinence.  She apparently does have some urgency but states that she has frequent leakage during the day.  She does not clearly attribute this to stress incontinence such as cough or sneezing or sudden change in position.  Sounds like she is having difficulty getting to the bathroom in time predominantly.  She has been wearing pads and frequently has to change 2-3 times per day.  Denies any significant nocturia.  No recent burning with urination.  No fever.  No suprapubic pressure or pain.  She does drink a fair amount of caffeine with coffee in the morning and drinks Pepsi 0 but is not sure if that has any caffeine or not.  She has seen urologist previously.  Other issue is difficulty getting up increasingly out of a chair.  She thinks she may be getting some weakness in her quadriceps.  She has stationary bicycle at home but not riding this recently.  She has knee pain left greater than right.  She knows she has some arthritis.  She uses Tylenol .  Avoids nonsteroidals.  Denies any recent fall.  Has had some sacrum and coccyx pain over the past really several months.  She had a fall in her driveway where she landed on pavement months ago and has had some mild pain since then.  No lumbar pain.  No difficulties with bowel movement.  Pain is not debilitating.  Past Medical History:  Diagnosis Date   Basal cell carcinoma 04/23/2011   upper left cheek (txpbx)   Chronic UTI (urinary tract infection)    recurrent   Clark level II melanoma (HCC) 10/09/2021   Left Elbow Post (excision)   Depression    DIVERTICULOSIS, COLON 03/17/2007   DJD (degenerative joint disease)     Esophageal stricture    Fatty liver    GERD 08/09/2008   Headache(784.0)    Hiatal hernia    HYPERCHOLESTEROLEMIA 07/07/2007   HYPERTENSION 07/07/2007   IBS (irritable bowel syndrome)    SCC (squamous cell carcinoma) 08/18/2014   right forearm (txpbx) cautery    Schatzki's ring    Squamous cell carcinoma of skin 11/29/2010   left inner shin (cx3fu) KA   TMJ syndrome    Past Surgical History:  Procedure Laterality Date   ABDOMINAL HYSTERECTOMY  1998   APPENDECTOMY     BREAST EXCISIONAL BIOPSY Left    Benign    BREAST SURGERY     milk gland   LUNG SURGERY  1983    reports that she has never smoked. She has never used smokeless tobacco. She reports that she does not drink alcohol and does not use drugs. family history includes Alcohol abuse in an other family member; Arthritis in her mother and another family member; Diabetes in an other family member; Heart disease in her mother and another family member; Hyperlipidemia in an other family member; Hypertension in an other family member; Stroke in her father and another family member; Thyroid  disease in her father. Allergies  Allergen Reactions   Codeine Sulfate     REACTION: nausea   Histamine     Anxious feeling  Review of Systems  Constitutional:  Negative for chills and fever.  Gastrointestinal:  Negative for abdominal pain.  Genitourinary:  Positive for urgency. Negative for flank pain and hematuria.      Objective:     BP (!) 146/70 (BP Location: Left Arm, Patient Position: Sitting, Cuff Size: Normal)   Pulse (!) 56   Temp 97.9 F (36.6 C) (Oral)   Wt 159 lb 9.6 oz (72.4 kg)   SpO2 98%   BMI 29.67 kg/m  BP Readings from Last 3 Encounters:  10/01/23 (!) 146/70  07/21/23 (!) 140/74  05/30/23 (!) 140/70   Wt Readings from Last 3 Encounters:  10/01/23 159 lb 9.6 oz (72.4 kg)  07/21/23 158 lb 1.6 oz (71.7 kg)  05/30/23 157 lb 9.6 oz (71.5 kg)      Physical Exam Vitals reviewed.  Constitutional:       General: She is not in acute distress.    Appearance: She is not ill-appearing.  Cardiovascular:     Rate and Rhythm: Normal rate and regular rhythm.  Pulmonary:     Effort: Pulmonary effort is normal.     Breath sounds: Normal breath sounds. No wheezing or rales.  Musculoskeletal:     Comments: She has mild chronic bilateral lower leg edema related to venous stasis.  No pitting edema.  Mild crepitus left knee with flexion extension.  No knee effusion.  No localized bony tenderness around the knee region.  No lumbar spine tenderness.  Neurological:     Mental Status: She is alert.      No results found for any visits on 10/01/23.  Last CBC Lab Results  Component Value Date   WBC 6.6 05/30/2023   HGB 13.9 05/30/2023   HCT 41.6 05/30/2023   MCV 94.5 05/30/2023   MCH 31.6 05/30/2023   RDW 12.4 05/30/2023   PLT 251 05/30/2023   Last metabolic panel Lab Results  Component Value Date   GLUCOSE 108 (H) 05/30/2023   NA 138 05/30/2023   K 3.8 05/30/2023   CL 100 05/30/2023   CO2 27 05/30/2023   BUN 20 05/30/2023   CREATININE 0.77 05/30/2023   GFR 75.84 02/19/2023   CALCIUM 9.2 05/30/2023   PROT 6.5 02/19/2023   ALBUMIN 3.8 02/19/2023   BILITOT 0.4 02/19/2023   ALKPHOS 58 02/19/2023   AST 15 02/19/2023   ALT 10 02/19/2023   ANIONGAP 8 09/28/2022   Last lipids Lab Results  Component Value Date   CHOL 127 07/21/2023   HDL 46.50 07/21/2023   LDLCALC 60 07/21/2023   TRIG 100.0 07/21/2023   CHOLHDL 3 07/21/2023   Last hemoglobin A1c No results found for: "HGBA1C" Last thyroid  functions Lab Results  Component Value Date   TSH 1.27 07/12/2022      The ASCVD Risk score (Arnett DK, et al., 2019) failed to calculate for the following reasons:   The 2019 ASCVD risk score is only valid for ages 33 to 68    Assessment & Plan:   #1 urine incontinence.  May have mix of stress and urgency.  Gradually scaled back caffeine.  Consider trial of Gemtesa 75 mg once  daily.  Give feedback in 1 month.  If not improving with that consider getting back into see urologist again.  #2 osteoarthritis involving knees.  Again stressed importance of regular exercise.  She has stationary bicycle but states she can only go about 10 minutes.  We recommended instead of focusing on 1 session try to combine  multiple sessions per day until she builds up strength and stamina.  Continue Tylenol  as needed for discomfort.  Avoid nonsteroidals.  #3 coccyx discomfort.  This is near the junction of the sacrum.  No lumbar tenderness.  She has had some issues ever since her fall months ago.  Pain is not debilitating.  Not interfering with ambulation.  We discussed possible x-rays of coccyx but this would not likely change therapy.  Continue Tylenol  as needed for discomfort    Glean Lamy, MD

## 2023-10-27 ENCOUNTER — Other Ambulatory Visit: Payer: Self-pay | Admitting: Family Medicine

## 2023-11-19 ENCOUNTER — Ambulatory Visit (INDEPENDENT_AMBULATORY_CARE_PROVIDER_SITE_OTHER): Payer: Medicare Other

## 2023-11-19 VITALS — Ht 62.0 in | Wt 159.0 lb

## 2023-11-19 DIAGNOSIS — Z0000 Encounter for general adult medical examination without abnormal findings: Secondary | ICD-10-CM

## 2023-11-19 NOTE — Progress Notes (Signed)
 Subjective:   Wendy Sandoval is a 83 y.o. who presents for a Medicare Wellness preventive visit.  As a reminder, Annual Wellness Visits don't include a physical exam, and some assessments may be limited, especially if this visit is performed virtually. We may recommend an in-person follow-up visit with your provider if needed.  Visit Complete: Virtual I connected with  DEENA SHAUB on 11/19/23 by a audio enabled telemedicine application and verified that I am speaking with the correct person using two identifiers.  Patient Location: Home  Provider Location: Home Office  I discussed the limitations of evaluation and management by telemedicine. The patient expressed understanding and agreed to proceed.  Vital Signs: Because this visit was a virtual/telehealth visit, some criteria may be missing or patient reported. Any vitals not documented were not able to be obtained and vitals that have been documented are patient reported.    Persons Participating in Visit: Patient.  AWV Questionnaire: No: Patient Medicare AWV questionnaire was not completed prior to this visit.  Cardiac Risk Factors include: advanced age (>75men, >11 women);hypertension     Objective:    Today's Vitals   11/19/23 1324  Weight: 159 lb (72.1 kg)  Height: 5' 2 (1.575 m)   Body mass index is 29.08 kg/m.     11/19/2023    1:37 PM 11/15/2022   11:28 AM 09/28/2022   10:34 AM 11/09/2021    3:10 PM 10/27/2020    9:27 AM 08/27/2017    9:43 AM 10/10/2016    8:52 PM  Advanced Directives  Does Patient Have a Medical Advance Directive? No Yes No Yes Yes Yes  No   Type of Special educational needs teacher of [place];Living will Healthcare Power of [employer] of [place];Living will Healthcare Power of [place];Living will    Does patient want to make changes to medical advance directive?    No - Patient declined     Copy of Healthcare Power of Attorney in Chart?  No - copy requested  No - copy  requested No - copy requested    Would patient like information on creating a medical advance directive? No - Patient declined           Data saved with a previous flowsheet row definition    Current Medications (verified) Outpatient Encounter Medications as of 11/19/2023  Medication Sig   amLODipine  (NORVASC ) 5 MG tablet TAKE 1 TABLET BY MOUTH EVERY DAY   cholecalciferol (VITAMIN D) 1000 UNITS tablet Take 1,000 Units by mouth 2 (two) times daily.  (Patient not taking: Reported on 11/19/2023)   escitalopram  (LEXAPRO ) 10 MG tablet TAKE 1 TABLET BY MOUTH EVERY DAY   esomeprazole  (NEXIUM ) 20 MG capsule Take 20 mg by mouth daily at 12 noon.   hydrochlorothiazide  (HYDRODIURIL ) 25 MG tablet TAKE 1 TABLET (25 MG TOTAL) BY MOUTH DAILY. *YEARLY APPOINTMENT REQUIRED FOR FUTURE REFILLS   lidocaine  (LIDODERM ) 5 % Place 1 patch onto the skin daily. Remove & Discard patch within 12 hours or as directed by MD   losartan  (COZAAR ) 50 MG tablet TAKE 1 TABLET BY MOUTH EVERY DAY   metoprolol  tartrate (LOPRESSOR ) 50 MG tablet TAKE 1/2 TABLET (25 MG TOTAL) BY MOUTH 2 (TWO) TIMES DAILY.   mupirocin  ointment (BACTROBAN ) 2 % Apply 1 Application topically daily.   ondansetron  (ZOFRAN -ODT) 4 MG disintegrating tablet Take 1 tablet (4 mg total) by mouth every 8 (eight) hours as needed. (Patient not taking: Reported on 11/19/2023)   simvastatin  (ZOCOR ) 40  MG tablet TAKE 1 TABLET BY MOUTH EVERYDAY AT BEDTIME   traMADol  (ULTRAM ) 50 MG tablet Take 1 tablet (50 mg total) by mouth every 6 (six) hours as needed for up to 10 doses for severe pain. (Patient not taking: Reported on 11/19/2023)   triamcinolone  cream (KENALOG ) 0.1 % Apply to leg nightly to as needed   Vibegron  (GEMTESA ) 75 MG TABS Take 1 tablet (75 mg total) by mouth daily.   No facility-administered encounter medications on file as of 11/19/2023.    Allergies (verified) Codeine sulfate and Histamine   History: Past Medical History:  Diagnosis Date   Basal cell  carcinoma 04/23/2011   upper left cheek (txpbx)   Chronic UTI (urinary tract infection)    recurrent   Clark level II melanoma (HCC) 10/09/2021   Left Elbow Post (excision)   Depression    DIVERTICULOSIS, COLON 03/17/2007   DJD (degenerative joint disease)    Esophageal stricture    Fatty liver    GERD 08/09/2008   Headache(784.0)    Hiatal hernia    HYPERCHOLESTEROLEMIA 07/07/2007   HYPERTENSION 07/07/2007   IBS (irritable bowel syndrome)    SCC (squamous cell carcinoma) 08/18/2014   right forearm (txpbx) cautery    Schatzki's ring    Squamous cell carcinoma of skin 11/29/2010   left inner shin (cx72fu) KA   TMJ syndrome    Past Surgical History:  Procedure Laterality Date   ABDOMINAL HYSTERECTOMY  1998   APPENDECTOMY     BREAST EXCISIONAL BIOPSY Left    Benign    BREAST SURGERY     milk gland   LUNG SURGERY  1983   Family History  Problem Relation Age of Onset   Heart disease Mother    Arthritis Mother    Stroke Father    Thyroid  disease Father    Arthritis Other    Hyperlipidemia Other    Hypertension Other    Alcohol abuse Other    Diabetes Other    Stroke Other    Heart disease Other    Breast cancer Neg Hx    Stomach cancer Neg Hx    Colon cancer Neg Hx    Esophageal cancer Neg Hx    Social History   Socioeconomic History   Marital status: Widowed    Spouse name: Not on file   Number of children: Not on file   Years of education: Not on file   Highest education level: Not on file  Occupational History   Not on file  Tobacco Use   Smoking status: Never   Smokeless tobacco: Never  Vaping Use   Vaping status: Never Used  Substance and Sexual Activity   Alcohol use: No   Drug use: No   Sexual activity: Not on file  Other Topics Concern   Not on file  Social History Narrative   Not on file   Social Drivers of Health   Financial Resource Strain: Low Risk  (11/19/2023)   Overall Financial Resource Strain (CARDIA)    Difficulty of Paying  Living Expenses: Not hard at all  Food Insecurity: No Food Insecurity (11/19/2023)   Hunger Vital Sign    Worried About Running Out of Food in the Last Year: Never true    Ran Out of Food in the Last Year: Never true  Transportation Needs: No Transportation Needs (11/19/2023)   PRAPARE - Administrator, Civil Service (Medical): No    Lack of Transportation (Non-Medical): No  Physical Activity:  Inactive (11/19/2023)   Exercise Vital Sign    Days of Exercise per Week: 0 days    Minutes of Exercise per Session: 0 min  Stress: No Stress Concern Present (11/19/2023)   [employer] of Occupational Health - Occupational Stress Questionnaire    Feeling of Stress: Not at all  Social Connections: Moderately Integrated (11/19/2023)   Social Connection and Isolation Panel    Frequency of Communication with Friends and Family: More than three times a week    Frequency of Social Gatherings with Friends and Family: More than three times a week    Attends Religious Services: More than 4 times per year    Active Member of [employer] or Organizations: Yes    Attends Banker Meetings: More than 4 times per year    Marital Status: Widowed    Tobacco Counseling Counseling given: Not Answered    Clinical Intake:  Pre-visit preparation completed: Yes  Pain : No/denies pain     BMI - recorded: 29.08 Nutritional Status: BMI 25 -29 Overweight Nutritional Risks: None Diabetes: No  No results found for: HGBA1C   How often do you need to have someone help you when you read instructions, pamphlets, or other written materials from your doctor or pharmacy?: 1 - Never  Interpreter Needed?: No  Information entered by :: Rojelio Blush LPN   Activities of Daily Living     11/19/2023    1:36 PM  In your present state of health, do you have any difficulty performing the following activities:  Hearing? 0  Vision? 0  Difficulty concentrating or making decisions? 0  Walking  or climbing stairs? 0  Dressing or bathing? 0  Doing errands, shopping? 0  Preparing Food and eating ? N  Using the Toilet? N  In the past six months, have you accidently leaked urine? Y  Comment Wears Pads. Followed by Urologist  Do you have problems with loss of bowel control? N  Managing your Medications? N  Managing your Finances? N  Housekeeping or managing your Housekeeping? N    Patient Care Team: Micheal Wolm ORN, MD as PCP - [facility] [place], Andrez SAUNDERS, PA-C as Physician Assistant (Dermatology)  I have updated your Care Teams any recent Medical Services you may have received from other providers in the past year.     Assessment:   This is a routine wellness examination for [place].  Hearing/Vision screen Hearing Screening - Comments:: Denies hearing difficulties   Vision Screening - Comments:: Wears rx glasses - up to date with routine eye exams with  Dr Patrcia   Goals Addressed               This Visit's Progress     Increase physical activity (pt-stated)        Remain active.       Depression Screen     11/19/2023    1:35 PM 10/01/2023    4:42 PM 11/15/2022   11:23 AM 10/22/2022   11:29 AM 07/12/2022    9:06 AM 11/09/2021    3:04 PM 10/05/2021    3:07 PM  PHQ 2/9 Scores  PHQ - 2 Score 0 1 0 0 0 0 0  PHQ- 9 Score 0 4 0 4 0 0 3    Fall Risk     11/19/2023    1:37 PM 11/15/2022   11:26 AM 11/09/2021    3:08 PM 10/05/2021    3:06 PM 10/27/2020    9:28 AM  Fall  Risk   Falls in the past year? 0 1 0 1 0  Number falls in past yr: 0 0 0 0 0  Injury with Fall? 0 1 0 1 0  Comment  Skin tear to rt eye. Followed by medical attention     Risk for fall due to : No Fall Risks No Fall Risks No Fall Risks No Fall Risks History of fall(s)  Follow up Falls evaluation completed Falls prevention discussed  Falls evaluation completed       Data saved with a previous flowsheet row definition    MEDICARE RISK AT HOME:  Medicare Risk at Home Any stairs in or around the  home?: Yes If so, are there any without handrails?: No Home free of loose throw rugs in walkways, pet beds, electrical cords, etc?: Yes Adequate lighting in your home to reduce risk of falls?: Yes Life alert?: No Use of a cane, walker or w/c?: No Grab bars in the bathroom?: Yes Shower chair or bench in shower?: Yes Elevated toilet seat or a handicapped toilet?: No  TIMED UP AND GO:  Was the test performed?  No  Cognitive Function: 6CIT completed    12/22/2015   11:54 AM  MMSE - Mini Mental State Exam  Not completed: --        11/19/2023    1:37 PM 11/19/2022    1:15 PM 11/18/2022    8:09 AM 11/15/2022    9:24 AM 11/09/2021    3:10 PM  6CIT Screen  What Year? 0 points 0 points 0 points 0 points 0 points  What month? 0 points 0 points 0 points 0 points 0 points  What time? 0 points 0 points 0 points 0 points 0 points  Count back from 20 0 points 0 points 0 points 0 points 0 points  Months in reverse 0 points 0 points 0 points 0 points 0 points  Repeat phrase 0 points 0 points 0 points 0 points 0 points  Total Score 0 points 0 points 0 points 0 points 0 points    Immunizations Immunization History  Administered Date(s) Administered   Influenza Split 03/13/2011, 02/21/2012   Influenza Whole 02/20/2009, 02/22/2013   Influenza, High Dose Seasonal PF 02/18/2014, 02/10/2017, 03/02/2018, 02/26/2019, 02/21/2020   Influenza-Unspecified 02/14/2016   Moderna Sars-Covid-2 Vaccination 07/15/2019, 08/12/2019   Pneumococcal Conjugate-13 03/13/2016   Pneumococcal Polysaccharide-23 07/04/2011   Tdap 09/28/2022   Zoster, Live 04/20/2013    Screening Tests Health Maintenance  Topic Date Due   Zoster Vaccines- Shingrix (1 of 2) [birth year]27/1960   DEXA SCAN  Never done   COVID-19 Vaccine (3 - Moderna risk series) 09/09/2019   INFLUENZA VACCINE  12/05/2023   Medicare Annual Wellness (AWV)  11/18/2024   DTaP/Tdap/Td (2 - Td or Tdap) 09/27/2032   Pneumococcal Vaccine: 50+ Years  Completed    Hepatitis B Vaccines  Aged Out   HPV VACCINES  Aged Out   Meningococcal B Vaccine  Aged Out    Health Maintenance  Health Maintenance Due  Topic Date Due   Zoster Vaccines- Shingrix (1 of 2) 04[birth year]27/1960   DEXA SCAN  Never done   COVID-19 Vaccine (3 - Moderna risk series) 09/09/2019   Health Maintenance Items Addressed: Dexa Scan deferred   Additional Screening:  Vision Screening: Recommended annual ophthalmology exams for early detection of glaucoma and other disorders of the eye. Would you like a referral to an eye doctor? No    Dental Screening: Recommended annual dental exams for proper  oral hygiene  Community Resource Referral / Chronic Care Management: CRR required this visit?  No   CCM required this visit?  No   Plan:    I have personally reviewed and noted the following in the patient's chart:   Medical and social history Use of alcohol, tobacco or illicit drugs  Current medications and supplements including opioid prescriptions. Patient is not currently taking opioid prescriptions. Functional ability and status Nutritional status Physical activity Advanced directives List of other physicians Hospitalizations, surgeries, and ER visits in previous 12 months Vitals Screenings to include cognitive, depression, and falls Referrals and appointments  In addition, I have reviewed and discussed with patient certain preventive protocols, quality metrics, and best practice recommendations. A written personalized care plan for preventive services as well as general preventive health recommendations were provided to patient.   Rojelio LELON Blush, LPN   2/83/7974   After Visit Summary: (MyChart) Due to this being a telephonic visit, the after visit summary with patients personalized plan was offered to patient via MyChart   Notes: Nothing significant to report at this time.

## 2023-11-19 NOTE — Patient Instructions (Addendum)
 Wendy Sandoval , Thank you for taking time out of your busy schedule to complete your Annual Wellness Visit with me. I enjoyed our conversation and look forward to speaking with you again next year. I, as well as your care team,  appreciate your ongoing commitment to your health goals. Please review the following plan we discussed and let me know if I can assist you in the future. Your Game plan/ To Do List    Referrals: If you haven't heard from the office you've been referred to, please reach out to them at the phone provided.   Follow up Visits: Next Medicare AWV with our clinical staff: 11/24/24 @ 11:20a   Have you seen your provider in the last 6 months (3 months if uncontrolled diabetes)? 10/01/23 Next Office Visit with your provider:   Clinician Recommendations:  Aim for 30 minutes of exercise or brisk walking, 6-8 glasses of water, and 5 servings of fruits and vegetables each day.       This is a list of the screening recommended for you and due dates:  Health Maintenance  Topic Date Due   Zoster (Shingles) Vaccine (1 of 2) 04/27/1958   DEXA scan (bone density measurement)  Never done   COVID-19 Vaccine (3 - Moderna risk series) 09/09/2019   Flu Shot  12/05/2023   Medicare Annual Wellness Visit  11/18/2024   DTaP/Tdap/Td vaccine (2 - Td or Tdap) 09/27/2032   Pneumococcal Vaccine for age over 39  Completed   Hepatitis B Vaccine  Aged Out   HPV Vaccine  Aged Out   Meningitis B Vaccine  Aged Out    Advanced directives: (Declined) Advance directive discussed with you today. Even though you declined this today, please call our office should you change your mind, and we can give you the proper paperwork for you to fill out. Advance Care Planning is important because it:  [x]  Makes sure you receive the medical care that is consistent with your values, goals, and preferences  [x]  It provides guidance to your family and loved ones and reduces their decisional burden about whether or not  they are making the right decisions based on your wishes.  Follow the link provided in your after visit summary or read over the paperwork we have mailed to you to help you started getting your Advance Directives in place. If you need assistance in completing these, please reach out to us  so that we can help you!  See attachments for Preventive Care and Fall Prevention Tips.

## 2023-12-01 ENCOUNTER — Other Ambulatory Visit: Payer: Self-pay | Admitting: Family Medicine

## 2023-12-02 ENCOUNTER — Telehealth: Payer: Self-pay

## 2023-12-02 NOTE — Telephone Encounter (Signed)
 Copied from CRM [id number]. Topic: Clinical - Medical Advice >> Dec 02, 2023 10:59 AM Carlyon D wrote: Reason for CRM: PT daughter called stating she needs advice with mom as she is forgetting her kids names Daughter is noticing mom is doing things different she is always wanting to mow her yard she's running in to things Mom is forgetting things mom is using a lot of pads 8+ pads a day. Pt daughter would like a call back on medical advice on what to do and what to look for what should she do. Daughter would like a call back [phone]

## 2023-12-02 NOTE — Telephone Encounter (Signed)
 I attempted to reach out to patient's daughter to schedule office visit for evaluation.

## 2023-12-03 ENCOUNTER — Telehealth: Payer: Self-pay | Admitting: Family Medicine

## 2023-12-03 NOTE — Telephone Encounter (Signed)
 ATC Renee but unable to reach at this time

## 2023-12-03 NOTE — Telephone Encounter (Signed)
 ATC patient's daughter [name] but could not reach at this time

## 2023-12-03 NOTE — Telephone Encounter (Signed)
 Copied from CRM [id number]. Topic: Clinical - Medical Advice >> Dec 03, 2023  2:00 PM Gennette ORN wrote: Reason for CRM: [name] [phone] she wanted to know if she needs to bring a UTI testing to the appointment to help. If her mother can't be tested at office.

## 2023-12-03 NOTE — Telephone Encounter (Signed)
 Patient has appt 12/05/2023

## 2023-12-05 ENCOUNTER — Encounter: Payer: Self-pay | Admitting: Family Medicine

## 2023-12-05 ENCOUNTER — Ambulatory Visit (INDEPENDENT_AMBULATORY_CARE_PROVIDER_SITE_OTHER): Admitting: Family Medicine

## 2023-12-05 VITALS — BP 160/68 | HR 97 | Temp 98.0°F | Wt 160.3 lb

## 2023-12-05 DIAGNOSIS — R300 Dysuria: Secondary | ICD-10-CM | POA: Diagnosis not present

## 2023-12-05 DIAGNOSIS — G3184 Mild cognitive impairment, so stated: Secondary | ICD-10-CM

## 2023-12-05 LAB — POC URINALSYSI DIPSTICK (AUTOMATED)
Bilirubin, UA: NEGATIVE
Blood, UA: POSITIVE
Glucose, UA: NEGATIVE
Ketones, UA: NEGATIVE
Nitrite, UA: NEGATIVE
Protein, UA: NEGATIVE
Spec Grav, UA: 1.015 (ref 1.010–1.025)
Urobilinogen, UA: 0.2 U/dL
pH, UA: 6 (ref 5.0–8.0)

## 2023-12-05 LAB — VITAMIN B12: Vitamin B-12: 964 pg/mL — ABNORMAL HIGH (ref 211–911)

## 2023-12-05 LAB — TSH: TSH: 1.2 u[IU]/mL (ref 0.35–5.50)

## 2023-12-05 MED ORDER — CEPHALEXIN 500 MG PO CAPS: 500.0000 mg | ORAL_CAPSULE | Freq: Three times a day (TID) | ORAL | 0 refills | Status: DC

## 2023-12-05 NOTE — Telephone Encounter (Signed)
 Patient has appt today.

## 2023-12-05 NOTE — Progress Notes (Signed)
 Established Patient Office Visit  Subjective   Patient ID: Wendy Sandoval, female    DOB: May 18, 1940  Age: 83 y.o. MRN: [id number]  Chief Complaint  Patient presents with   Altered Mental Status   Urinary Frequency    HPI   [name] is seen today accompanied by daughter.  Daughter had called with concerns that she has had some cognitive impairment and intermittent confusion.  She does have history of frequent UTI and mixed urine incontinence and has seen urologist in the past.  She is supposed to be on Gemtesa  but apparently not taking.  She for some time had been on prophylaxis for UTIs but apparently not taking antibiotic now.  Denies any current burning with urination.  No fever.  Does have some urine frequency.  Daughter recently expressed concern that she was forgetting her kids names.  No reported head injury.  Denies any recent chest pains.  She has history of hypertension, GERD, IBS, melanoma, recurrent depression.  Denies any depression symptoms recently.  She has some very transient confusion last October and had MMSE of 29/30 at that time.  Symptoms cleared very rapidly we wonder if she may have had a TIA.  No recent focal neurologic symptoms.  Past Medical History:  Diagnosis Date   Basal cell carcinoma 04/23/2011   upper left cheek (txpbx)   Chronic UTI (urinary tract infection)    recurrent   Clark level II melanoma (HCC) 10/09/2021   Left Elbow Post (excision)   Depression    DIVERTICULOSIS, COLON 03/17/2007   DJD (degenerative joint disease)    Esophageal stricture    Fatty liver    GERD 08/09/2008   Headache(784.0)    Hiatal hernia    HYPERCHOLESTEROLEMIA 07/07/2007   HYPERTENSION 07/07/2007   IBS (irritable bowel syndrome)    SCC (squamous cell carcinoma) 08/18/2014   right forearm (txpbx) cautery    Schatzki's ring    Squamous cell carcinoma of skin 11/29/2010   left inner shin (cx16fu) KA   TMJ syndrome    Past Surgical History:  Procedure  Laterality Date   ABDOMINAL HYSTERECTOMY  1998   APPENDECTOMY     BREAST EXCISIONAL BIOPSY Left    Benign    BREAST SURGERY     milk gland   LUNG SURGERY  1983    reports that she has never smoked. She has never used smokeless tobacco. She reports that she does not drink alcohol and does not use drugs. family history includes Alcohol abuse in an other family member; Arthritis in her mother and another family member; Diabetes in an other family member; Heart disease in her mother and another family member; Hyperlipidemia in an other family member; Hypertension in an other family member; Stroke in her father and another family member; Thyroid  disease in her father. Allergies  Allergen Reactions   Codeine Sulfate     REACTION: nausea   Histamine     Anxious feeling    Review of Systems  Constitutional:  Negative for chills, fever, malaise/fatigue and weight loss.  Eyes:  Negative for blurred vision.  Respiratory:  Negative for shortness of breath.   Cardiovascular:  Negative for chest pain.  Gastrointestinal:  Negative for abdominal pain.  Genitourinary:  Positive for frequency and urgency. Negative for flank pain and hematuria.  Neurological:  Negative for dizziness, weakness and headaches.      Objective:     BP (!) 160/68   Pulse 97   Temp 98 F (36.7  C) (Oral)   Wt 160 lb 4.8 oz (72.7 kg)   SpO2 97%   BMI 29.32 kg/m  BP Readings from Last 3 Encounters:  12/05/23 (!) 160/68  10/01/23 (!) 146/70  07/21/23 (!) 140/74   Wt Readings from Last 3 Encounters:  12/05/23 160 lb 4.8 oz (72.7 kg)  11/19/23 159 lb (72.1 kg)  10/01/23 159 lb 9.6 oz (72.4 kg)      Physical Exam Vitals reviewed.  Constitutional:      General: She is not in acute distress.    Appearance: She is well-developed. She is not ill-appearing.  Eyes:     Pupils: Pupils are equal, round, and reactive to light.  Neck:     Thyroid : No thyromegaly.     Vascular: No JVD.  Cardiovascular:     Rate  and Rhythm: Normal rate and regular rhythm.     Heart sounds:     No gallop.  Pulmonary:     Effort: Pulmonary effort is normal. No respiratory distress.     Breath sounds: Normal breath sounds. No wheezing or rales.  Musculoskeletal:     Cervical back: Neck supple.  Neurological:     Mental Status: She is alert.  Psychiatric:     Comments: MMSE 27/30.  Missed 2 out of 3 on 3 word recall and 1 out of 5 on serial subtractions      Results for orders placed or performed in visit on 12/05/23  POCT Urinalysis Dipstick (Automated)  Result Value Ref Range   Color, UA Yellow    Clarity, UA Cloudy    Glucose, UA Negative Negative   Bilirubin, UA Negative    Ketones, UA Negative    Spec Grav, UA 1.015 1.010 - 1.025   Blood, UA Positive    pH, UA 6.0 5.0 - 8.0   Protein, UA Negative Negative   Urobilinogen, UA 0.2 0.2 or 1.0 E.U./dL   Nitrite, UA Negative    Leukocytes, UA Large (3+) (A) Negative      The ASCVD Risk score (Arnett DK, et al., 2019) failed to calculate for the following reasons:   The 2019 ASCVD risk score is only valid for ages 58 to 36    Assessment & Plan:   Problem List Items Addressed This Visit       Unprioritized   DYSURIA - Primary   Relevant Orders   POCT Urinalysis Dipstick (Automated) (Completed)   Urine Culture   Other Visit Diagnoses       Mild cognitive impairment       Relevant Orders   Vitamin B12   TSH     Patient presents with urine urgency and frequency.  This is more or less chronic but she has had some recent intermittent confusion/cognitive impairment which seems to be worse.  Urine dipstick today suggest possible UTI.  Urine culture sent.  Start Keflex  500 mg 3 times daily for 7 days pending culture results.  Patient scored 27/30 on MMSE compared to 29 last fall.  Check TSH and B12.  Discussed safety with issues like driving and avoidance of leaving stove on.  Recommend follow-up in 6 months and repeat MMSE at that time.  If  still chronic that point consider MRI and possible neurology referral  Return in about 6 months (around 06/06/2024).    Wolm [name], MD

## 2023-12-07 ENCOUNTER — Ambulatory Visit: Payer: Self-pay | Admitting: Family Medicine

## 2023-12-08 LAB — URINE CULTURE
MICRO NUMBER:: 16775926
SPECIMEN QUALITY:: ADEQUATE

## 2023-12-08 MED ORDER — SULFAMETHOXAZOLE-TRIMETHOPRIM 800-160 MG PO TABS: 1.0000 | ORAL_TABLET | Freq: Two times a day (BID) | ORAL | 0 refills | Status: AC

## 2023-12-24 ENCOUNTER — Other Ambulatory Visit: Payer: Self-pay | Admitting: Family Medicine

## 2023-12-24 DIAGNOSIS — F339 Major depressive disorder, recurrent, unspecified: Secondary | ICD-10-CM

## 2023-12-24 NOTE — Telephone Encounter (Unsigned)
 Copied from CRM #[id number]. Topic: Clinical - Medication Refill >> Dec 24, 2023  1:44 PM Roselie C wrote: Medication:   cephALEXin  (KEFLEX ) 500 MG capsule  Has the patient contacted their pharmacy? No (Agent: If no, request that the patient contact the pharmacy for the refill. If patient does not wish to contact the pharmacy document the reason why and proceed with request.) (Agent: If yes, when and what did the pharmacy advise?)  This is the patient's preferred pharmacy:  [id number] - [place], [place] - [address] [place] [address] Phone: [phone] Fax: [phone]  Is this the correct pharmacy for this prescription? No If no, delete pharmacy and type the correct one.   Has the prescription been filled recently? Yes  Is the patient out of the medication? Yes  Has the patient been seen for an appointment in the last year OR does the patient have an upcoming appointment? Yes  Can we respond through MyChart? Yes  Agent: Please be advised that Rx refills may take up to 3 business days. We ask that you follow-up with your pharmacy.

## 2024-01-01 DIAGNOSIS — B078 Other viral warts: Secondary | ICD-10-CM | POA: Diagnosis not present

## 2024-01-01 DIAGNOSIS — L570 Actinic keratosis: Secondary | ICD-10-CM | POA: Diagnosis not present

## 2024-01-01 DIAGNOSIS — D225 Melanocytic nevi of trunk: Secondary | ICD-10-CM | POA: Diagnosis not present

## 2024-01-01 DIAGNOSIS — Z85820 Personal history of malignant melanoma of skin: Secondary | ICD-10-CM | POA: Diagnosis not present

## 2024-01-01 DIAGNOSIS — Z1283 Encounter for screening for malignant neoplasm of skin: Secondary | ICD-10-CM | POA: Diagnosis not present

## 2024-01-01 DIAGNOSIS — X32XXXD Exposure to sunlight, subsequent encounter: Secondary | ICD-10-CM | POA: Diagnosis not present

## 2024-01-01 DIAGNOSIS — L821 Other seborrheic keratosis: Secondary | ICD-10-CM | POA: Diagnosis not present

## 2024-01-01 DIAGNOSIS — Z08 Encounter for follow-up examination after completed treatment for malignant neoplasm: Secondary | ICD-10-CM | POA: Diagnosis not present

## 2024-01-22 ENCOUNTER — Other Ambulatory Visit: Payer: Self-pay | Admitting: Family Medicine

## 2024-03-22 ENCOUNTER — Encounter: Payer: Self-pay | Admitting: Family Medicine

## 2024-03-22 ENCOUNTER — Ambulatory Visit (INDEPENDENT_AMBULATORY_CARE_PROVIDER_SITE_OTHER): Admitting: Family Medicine

## 2024-03-22 VITALS — BP 160/70 | HR 61 | Temp 97.6°F | Wt 158.9 lb

## 2024-03-22 DIAGNOSIS — R4189 Other symptoms and signs involving cognitive functions and awareness: Secondary | ICD-10-CM | POA: Diagnosis not present

## 2024-03-22 DIAGNOSIS — I10 Essential (primary) hypertension: Secondary | ICD-10-CM

## 2024-03-22 DIAGNOSIS — Z23 Encounter for immunization: Secondary | ICD-10-CM | POA: Diagnosis not present

## 2024-03-22 MED ORDER — LOSARTAN POTASSIUM 50 MG PO TABS: 50.0000 mg | ORAL_TABLET | Freq: Every day | ORAL | 3 refills | Status: AC

## 2024-03-22 NOTE — Progress Notes (Signed)
 Established Patient Office Visit  Subjective   Patient ID: Wendy Sandoval, female    DOB: 25-Mar-1941  Age: 83 y.o. MRN: [id number]  No chief complaint on file.   HPI   [name] is seen today accompanied by her daughter.  Her chronic problems include history of hypertension, GERD, fatty liver, IBS, melanoma, hyperlipidemia.  Their main concern is been some recent memory issues.  Her daughter mentions that last week she inquired where her husband was when he had been deceased for many years.  About a year ago there was some concern for cognitive change and we performed MMSE which was scored 29/30.  This was repeated a few months ago and 27/30.  Daughters had some definite concerns regarding her short-term memory.  In the past she has had recurrent UTIs and cognitive changes have certainly seem more pronounced in the setting of UTI.  She had UTI couple weeks ago treated by urology.  Daughter does think she has some baseline confusion though at times in between her recent UTIs.  She had recent TSH and B12 which were normal.  We discussed possibly getting MRI and neurology referral last visit but we ended up deciding on close follow-up and deferring on those at that time.  She had CT head May 2024 following a fall with no acute abnormalities.  There was noted age-related atrophy and chronic small vessel ischemic changes.  Patient relates her father may have had dementia in his 80s and had apparently had history of mini strokes .  Patient lives alone but her daughter lives nearby.  Patient does still drive very short distances.  Past Medical History:  Diagnosis Date   Basal cell carcinoma 04/23/2011   upper left cheek (txpbx)   Chronic UTI (urinary tract infection)    recurrent   Clark level II melanoma (HCC) 10/09/2021   Left Elbow Post (excision)   Depression    DIVERTICULOSIS, COLON 03/17/2007   DJD (degenerative joint disease)    Esophageal stricture    Fatty liver    GERD  08/09/2008   Headache(784.0)    Hiatal hernia    HYPERCHOLESTEROLEMIA 07/07/2007   HYPERTENSION 07/07/2007   IBS (irritable bowel syndrome)    SCC (squamous cell carcinoma) 08/18/2014   right forearm (txpbx) cautery    Schatzki's ring    Squamous cell carcinoma of skin 11/29/2010   left inner shin (cx29fu) KA   TMJ syndrome    Past Surgical History:  Procedure Laterality Date   ABDOMINAL HYSTERECTOMY  1998   APPENDECTOMY     BREAST EXCISIONAL BIOPSY Left    Benign    BREAST SURGERY     milk gland   LUNG SURGERY  1983    reports that she has never smoked. She has never used smokeless tobacco. She reports that she does not drink alcohol and does not use drugs. family history includes Alcohol abuse in an other family member; Arthritis in her mother and another family member; Diabetes in an other family member; Heart disease in her mother and another family member; Hyperlipidemia in an other family member; Hypertension in an other family member; Stroke in her father and another family member; Thyroid  disease in her father. Allergies  Allergen Reactions   Codeine Sulfate     REACTION: nausea   Histamine     Anxious feeling    Review of Systems  Constitutional:  Negative for malaise/fatigue.  Eyes:  Negative for blurred vision.  Respiratory:  Negative for shortness of  breath.   Cardiovascular:  Negative for chest pain.  Neurological:  Negative for dizziness, weakness and headaches.      Objective:     BP (!) 160/70   Pulse 61   Temp 97.6 F (36.4 C) (Oral)   Wt 158 lb 14.4 oz (72.1 kg)   SpO2 95%   BMI 29.06 kg/m  BP Readings from Last 3 Encounters:  03/22/24 (!) 160/70  12/05/23 (!) 160/68  10/01/23 (!) 146/70   Wt Readings from Last 3 Encounters:  03/22/24 158 lb 14.4 oz (72.1 kg)  12/05/23 160 lb 4.8 oz (72.7 kg)  11/19/23 159 lb (72.1 kg)      Physical Exam Vitals reviewed.  Constitutional:      General: She is not in acute distress.    Appearance:  She is well-developed. She is not ill-appearing.  Eyes:     Pupils: Pupils are equal, round, and reactive to light.  Neck:     Thyroid : No thyromegaly.     Vascular: No JVD.  Cardiovascular:     Rate and Rhythm: Normal rate and regular rhythm.     Heart sounds:     No gallop.  Pulmonary:     Effort: Pulmonary effort is normal. No respiratory distress.     Breath sounds: Normal breath sounds. No wheezing or rales.  Musculoskeletal:     Cervical back: Neck supple.  Neurological:     Mental Status: She is alert.     Motor: No weakness.     Comments: Today on exam she struggled a bit with day of the week and could not remember the year.  She was able to recall the month.      No results found for any visits on 03/22/24.  Last CBC Lab Results  Component Value Date   WBC 6.6 05/30/2023   HGB 13.9 05/30/2023   HCT 41.6 05/30/2023   MCV 94.5 05/30/2023   MCH 31.6 05/30/2023   RDW 12.4 05/30/2023   PLT 251 05/30/2023   Last metabolic panel Lab Results  Component Value Date   GLUCOSE 108 (H) 05/30/2023   NA 138 05/30/2023   K 3.8 05/30/2023   CL 100 05/30/2023   CO2 27 05/30/2023   BUN 20 05/30/2023   CREATININE 0.77 05/30/2023   GFR 75.84 02/19/2023   CALCIUM 9.2 05/30/2023   PROT 6.5 02/19/2023   ALBUMIN 3.8 02/19/2023   BILITOT 0.4 02/19/2023   ALKPHOS 58 02/19/2023   AST 15 02/19/2023   ALT 10 02/19/2023   ANIONGAP 8 09/28/2022   Last lipids Lab Results  Component Value Date   CHOL 127 07/21/2023   HDL 46.50 07/21/2023   LDLCALC 60 07/21/2023   TRIG 100.0 07/21/2023   CHOLHDL 3 07/21/2023   Last hemoglobin A1c No results found for: HGBA1C Last thyroid  functions Lab Results  Component Value Date   TSH 1.20 12/05/2023   Last vitamin B12 and Folate Lab Results  Component Value Date   VITAMINB12 964 (H) 12/05/2023   FOLATE >20.0 05/15/2010      The ASCVD Risk score (Arnett DK, et al., 2019) failed to calculate for the following reasons:   The  2019 ASCVD risk score is only valid for ages 69 to 72    Assessment & Plan:   #1 cognitive decline/memory changes.  She is struggling with short-term memory.  We have recommended at this point neurology referral and patient and her daughter agree.  Referral placed.  We did discuss probable indication  for MRI brain but they would like to defer and discuss with neurology first.  We did discuss safety issues.  At this point she is driving very little  #2 hypertension.  Up a bit today but apparently she ran out of her losartan  recently.  They plan to get that straightened out soon.  Refill sent today.  Get back on all of her usual medications and we have recommended daughter monitor her blood pressure several times over the next few weeks and be in touch if systolic consistently over 140.  Try to keep daily sodium intake down.   Return in about 3 months (around 06/22/2024).    Wolm [name], MD

## 2024-03-22 NOTE — Patient Instructions (Signed)
 Get back on the Losartan  daily  Check some blood pressures and be in touch if top number consisently > 140.

## 2024-04-07 ENCOUNTER — Other Ambulatory Visit: Payer: Self-pay | Admitting: Family Medicine

## 2024-04-08 ENCOUNTER — Emergency Department (HOSPITAL_BASED_OUTPATIENT_CLINIC_OR_DEPARTMENT_OTHER)
Admission: EM | Admit: 2024-04-08 | Discharge: 2024-04-08 | Disposition: A | Discharge status: Home / Self Care | Attending: Emergency Medicine | Admitting: Emergency Medicine

## 2024-04-08 ENCOUNTER — Other Ambulatory Visit: Payer: Self-pay

## 2024-04-08 ENCOUNTER — Emergency Department (HOSPITAL_BASED_OUTPATIENT_CLINIC_OR_DEPARTMENT_OTHER)

## 2024-04-08 DIAGNOSIS — D72829 Elevated white blood cell count, unspecified: Secondary | ICD-10-CM | POA: Insufficient documentation

## 2024-04-08 DIAGNOSIS — K8050 Calculus of bile duct without cholangitis or cholecystitis without obstruction: Secondary | ICD-10-CM

## 2024-04-08 DIAGNOSIS — R1011 Right upper quadrant pain: Secondary | ICD-10-CM | POA: Diagnosis present

## 2024-04-08 DIAGNOSIS — K8020 Calculus of gallbladder without cholecystitis without obstruction: Secondary | ICD-10-CM | POA: Diagnosis not present

## 2024-04-08 DIAGNOSIS — N390 Urinary tract infection, site not specified: Secondary | ICD-10-CM | POA: Diagnosis not present

## 2024-04-08 LAB — COMPREHENSIVE METABOLIC PANEL WITH GFR
ALT: 13 U/L (ref 0–44)
AST: 21 U/L (ref 15–41)
Albumin: 4.1 g/dL (ref 3.5–5.0)
Alkaline Phosphatase: 56 U/L (ref 38–126)
Anion gap: 12 (ref 5–15)
BUN: 13 mg/dL (ref 8–23)
CO2: 25 mmol/L (ref 22–32)
Calcium: 10 mg/dL (ref 8.9–10.3)
Chloride: 101 mmol/L (ref 98–111)
Creatinine, Ser: 0.83 mg/dL (ref 0.44–1.00)
GFR, Estimated: >60 mL/min (ref >60)
Glucose, Bld: 120 mg/dL — ABNORMAL HIGH (ref 70–99)
Potassium: 3.2 mmol/L — ABNORMAL LOW (ref 3.5–5.1)
Sodium: 138 mmol/L (ref 135–145)
Total Bilirubin: 0.9 mg/dL (ref 0.0–1.2)
Total Protein: 7.3 g/dL (ref 6.5–8.1)

## 2024-04-08 LAB — URINALYSIS, ROUTINE W REFLEX MICROSCOPIC
Bilirubin Urine: NEGATIVE
Glucose, UA: NEGATIVE mg/dL
Ketones, ur: 15 mg/dL — AB
Nitrite: POSITIVE — AB
Protein, ur: NEGATIVE mg/dL
Specific Gravity, Urine: 1.013 (ref 1.005–1.030)
WBC, UA: >50 WBC/hpf (ref 0–5)
pH: 5.5 (ref 5.0–8.0)

## 2024-04-08 LAB — CBC
HCT: 42.4 % (ref 36.0–46.0)
Hemoglobin: 15.1 g/dL — ABNORMAL HIGH (ref 12.0–15.0)
MCH: 32.5 pg (ref 26.0–34.0)
MCHC: 35.6 g/dL (ref 30.0–36.0)
MCV: 91.4 fL (ref 80.0–100.0)
Platelets: 270 K/uL (ref 150–400)
RBC: 4.64 MIL/uL (ref 3.87–5.11)
RDW: 12.6 % (ref 11.5–15.5)
WBC: 12.7 K/uL — ABNORMAL HIGH (ref 4.0–10.5)
nRBC: 0 % (ref 0.0–0.2)

## 2024-04-08 LAB — LIPASE, BLOOD: Lipase: 13 U/L (ref 11–51)

## 2024-04-08 MED ORDER — LEVOFLOXACIN IN D5W 750 MG/150ML IV SOLN
750.0000 mg | Freq: Once | INTRAVENOUS | Status: AC
  Administered 2024-04-08: 750 mg via INTRAVENOUS
  Filled 2024-04-08: qty 150

## 2024-04-08 MED ORDER — ONDANSETRON 4 MG PO TBDP
4.0000 mg | ORAL_TABLET | Freq: Once | ORAL | Status: AC
  Administered 2024-04-08: 4 mg via ORAL
  Filled 2024-04-08: qty 1

## 2024-04-08 MED ORDER — HYDROCODONE-ACETAMINOPHEN 5-325 MG PO TABS
1.0000 | ORAL_TABLET | Freq: Once | ORAL | Status: AC
  Administered 2024-04-08: 1 via ORAL
  Filled 2024-04-08: qty 1

## 2024-04-08 MED ORDER — ONDANSETRON 4 MG PO TBDP: 4.0000 mg | ORAL_TABLET | Freq: Three times a day (TID) | ORAL | 0 refills | Status: AC | PRN

## 2024-04-08 MED ORDER — LEVOFLOXACIN 500 MG PO TABS: 500.0000 mg | ORAL_TABLET | ORAL | 0 refills | Status: AC

## 2024-04-08 NOTE — ED Notes (Signed)
 Pt tolerating PO challenge of water and saltine crackers appropriately at this time

## 2024-04-08 NOTE — ED Notes (Signed)
 Antibiotic paused per request from pharmacy.

## 2024-04-08 NOTE — ED Provider Notes (Signed)
 [place] EMERGENCY DEPARTMENT AT [facility] Provider Note   CSN: [id number] Arrival date & time: 04/08/24  9195     Patient presents with: Abdominal Pain   Wendy Sandoval is a 83 y.o. female.   83 year old female with a history of chronic right upper quadrant abdominal pain, esophageal stricture, and peptic ulcer disease who presents to the emergency department with right upper quadrant abdominal pain.  Symptoms started on Friday.  Have been constant.  Dull pain.  Currently mild.  Postprandial.  Has also had 5-6 episodes of nonbloody nonbilious emesis.  Also having approximately the same amount of loose stools.  No fevers or chills.  Has had a hysterectomy and is unsure if she still has her gallbladder and appendix.  No dysuria or frequency.       Prior to Admission medications   Medication Sig Start Date End Date Taking? Authorizing Provider  estradiol (ESTRACE) 0.01 % CREA vaginal cream Place vaginally. 03/17/24  Yes [provider]  levofloxacin (LEVAQUIN) 500 MG tablet Take 1 tablet (500 mg total) by mouth every other day for 10 days. 04/08/24 04/18/24 Yes Yolande Lamar BROCKS, MD  ondansetron  (ZOFRAN -ODT) 4 MG disintegrating tablet Take 1 tablet (4 mg total) by mouth every 8 (eight) hours as needed for nausea or vomiting. 04/08/24  Yes Yolande Lamar BROCKS, MD  amLODipine  (NORVASC ) 5 MG tablet TAKE 1 TABLET BY MOUTH EVERY DAY 01/26/24   Burchette, Wolm ORN, MD  cephALEXin  (KEFLEX ) 250 MG capsule Take 250 mg by mouth daily. 01/12/24   [provider]  escitalopram  (LEXAPRO ) 10 MG tablet TAKE 1 TABLET BY MOUTH EVERY DAY 12/24/23   Burchette, Wolm ORN, MD  esomeprazole  (NEXIUM ) 20 MG capsule Take 20 mg by mouth daily at 12 noon.    [provider]  hydrochlorothiazide  (HYDRODIURIL ) 25 MG tablet TAKE 1 TABLET (25 MG TOTAL) BY MOUTH DAILY. *YEARLY APPOINTMENT REQUIRED FOR FUTURE REFILLS 07/14/23   Burchette, Wolm ORN, MD  lidocaine  (LIDODERM ) 5 % Place 1 patch  onto the skin daily. Remove & Discard patch within 12 hours or as directed by MD 09/28/22   Ethyl Richerd BROCKS, MD  losartan  (COZAAR ) 50 MG tablet Take 1 tablet (50 mg total) by mouth daily. 03/22/24   Burchette, Wolm ORN, MD  metoprolol  tartrate (LOPRESSOR ) 50 MG tablet TAKE 1/2 TABLET (25 MG TOTAL) BY MOUTH 2 (TWO) TIMES DAILY. 04/08/24   Burchette, Wolm ORN, MD  nitrofurantoin , macrocrystal-monohydrate, (MACROBID ) 100 MG capsule Take 100 mg by mouth 2 (two) times daily. 03/19/24   [provider]  Vibegron  (GEMTESA ) 75 MG TABS Take 1 tablet (75 mg total) by mouth daily. 10/01/23   Burchette, Wolm ORN, MD    Allergies: Codeine sulfate and Histamine    Review of Systems  Updated Vital Signs BP 111/60   Pulse (!) 58   Temp 97.6 F (36.4 C) (Oral)   Resp 15   Wt 72.1 kg   SpO2 95%   BMI 29.06 kg/m   Physical Exam Constitutional:      Appearance: Normal appearance.  HENT:     Right Ear: External ear normal.     Left Ear: External ear normal.  Abdominal:     General: There is no distension.     Palpations: There is no mass.     Tenderness: There is abdominal tenderness (Right mid abdomen and right upper quadrant). There is no right CVA tenderness, left CVA tenderness or guarding.  Neurological:     Mental Status:  She is alert.     (all labs ordered are listed, but only abnormal results are displayed) Labs Reviewed  COMPREHENSIVE METABOLIC PANEL WITH GFR - Abnormal; Notable for the following components:      Result Value   Potassium 3.2 (*)    Glucose, Bld 120 (*)    All other components within normal limits  CBC - Abnormal; Notable for the following components:   WBC 12.7 (*)    Hemoglobin 15.1 (*)    All other components within normal limits  URINALYSIS, ROUTINE W REFLEX MICROSCOPIC - Abnormal; Notable for the following components:   APPearance HAZY (*)    Hgb urine dipstick SMALL (*)    Ketones, ur 15 (*)    Nitrite POSITIVE (*)    Leukocytes,Ua LARGE (*)     Bacteria, UA MANY (*)    All other components within normal limits  URINE CULTURE  LIPASE, BLOOD    EKG: EKG Interpretation Date/Time:  Thursday April 08 2024 08:16:26 EST Ventricular Rate:  61 PR Interval:  107 QRS Duration:  94 QT Interval:  414 QTC Calculation: 417 R Axis:   33  Text Interpretation: Sinus rhythm Short PR interval Minimal ST depression, diffuse leads Confirmed by Yolande Charleston [phone]) on 04/08/2024 8:25:58 AM  Radiology: US  Abdomen Limited RUQ (LIVER/GB) Result Date: 04/08/2024 EXAM: Right Upper Quadrant Abdominal Ultrasound TECHNIQUE: Real-time ultrasonography of the right upper quadrant of the abdomen was performed. COMPARISON: US  Abdomen 09/11/2016; 05/21/2010. CLINICAL HISTORY: RUQ abdominal pain. FINDINGS: LIVER: The liver demonstrates normal echogenicity. No intrahepatic biliary ductal dilatation. 11 mm simple appearing cyst in the left hepatic lobe. BILIARY SYSTEM: Gallbladder wall thickness measures 1.9 mm. Layering sludge within the gallbladder. No cholelithiasis. No evidence of pericholecystic fluid. The common bile duct measures 5.9 mm. OTHER: No right upper quadrant ascites. IMPRESSION: 1. Layering sludge within the gallbladder. 2. No evidence of cholelithiasis or cholecystitis. Electronically signed by: Franky Crease MD 04/08/2024 10:08 AM EST RP Workstation: [id number]     Procedures   Medications Ordered in the ED  levofloxacin (LEVAQUIN) IVPB 750 mg (750 mg Intravenous New Bag/Given 04/08/24 1031)  HYDROcodone-acetaminophen  (NORCO/VICODIN) 5-325 MG per tablet 1 tablet (1 tablet Oral Given 04/08/24 0855)  ondansetron  (ZOFRAN -ODT) disintegrating tablet 4 mg (4 mg Oral Given 04/08/24 0854)    Clinical Course as of 04/08/24 1155  Thu Apr 08, 2024  1020 Daughter reports that she's been complaining of dysuria recently even though the patient denies this.  [RP]  1040 Levaquin discussed with pharmacy who recommends an rx for 750mg  every other day or 500mg   daily since her CrCl is <50 [RP]    Clinical Course User Index [RP] Yolande Charleston BROCKS, MD                                 Medical Decision Making Amount and/or Complexity of Data Reviewed Labs: ordered. Radiology: ordered.  Risk Prescription drug management.   MANA HABERL is a 83 year old female with a history of chronic right upper quadrant abdominal pain, esophageal stricture, and peptic ulcer disease who presents to the emergency department with right upper quadrant abdominal pain.    Initial Ddx:  Gastroenteritis, biliary colic, cholecystitis, cholangitis, esophageal stricture, peptic ulcer disease, UTI  MDM/Course:  Patient presents emergency department with right upper quadrant abdominal pain.  Chronic but worsened over the past few days.  Has also had some nausea and vomiting.  Also is reporting  some diarrhea.  On exam does have mild right upper quadrant tenderness to palpation.  No rebound or guarding.  No CVA tenderness palpation.  Afebrile and vital signs otherwise reassuring.  She underwent a right upper quadrant ultrasound which does show sludge in the gallbladder without any evidence of cholecystitis.  Lab work showed mild leukocytosis of 12.7.  Urinalysis appears to be contaminated.  Had discussion with the patient and her daughter about this and her daughter is concerned and says that she has been having some dysuria recently.  I did review her prior culture results and we will give her Levaquin at this point in time.  Pharmacy recommends every other day dosing for this.  Since she is not septic appearing I feel that she can be treated as an outpatient for this.  Upon re-evaluation she is tolerating p.o. without difficulty.  No recurrence of the pain or vomiting.  Discharged home with instructions to take Zofran  for her nausea and vomiting and follow-up with general surgery to see if she needs an elective cholecystectomy.  Also instructed to follow-up with her primary  doctor in a few days about her urinary symptoms  This patient presents to the ED for concern of complaints listed in HPI, this involves an extensive number of treatment options, and is a complaint that carries with it a high risk of complications and morbidity. Disposition including potential need for admission considered.   Dispo: DC Home. Return precautions discussed including, but not limited to, those listed in the AVS. Allowed pt time to ask questions which were answered fully prior to dc.  Additional history obtained from daughter Records reviewed Outpatient Clinic Notes The following labs were independently interpreted: Urinalysis and show urinary tract infection I independently reviewed the following imaging with scope of interpretation limited to determining acute life threatening conditions related to emergency care: Right upper quadrant ultrasound and agree with the radiologist interpretation with the following exceptions: none I personally reviewed and interpreted cardiac monitoring: normal sinus rhythm  I personally reviewed and interpreted the pt's EKG: see above for interpretation  I have reviewed the patients home medications and made adjustments as needed Social Determinants of health:  Geriatric  Portions of this note were generated with Scientist, clinical (histocompatibility and immunogenetics). Dictation errors may occur despite best attempts at proofreading.     Final diagnoses:  Biliary colic  Urinary tract infection with hematuria, site unspecified    ED Discharge Orders          Ordered    levofloxacin (LEVAQUIN) 500 MG tablet  Every other day        04/08/24 1138    ondansetron  (ZOFRAN -ODT) 4 MG disintegrating tablet  Every 8 hours PRN        04/08/24 1138               Yolande Lamar BROCKS, MD 04/08/24 1155

## 2024-04-08 NOTE — ED Triage Notes (Signed)
 C/o R sided abd pain with n/v/d since Saturday.

## 2024-04-08 NOTE — ED Notes (Signed)
 Pt d/c instructions, medications, and follow-up care reviewed with pt and pt's daughter. Pt and pt's daughter verbalized understanding and had no further questions at time of d/c. Pt CA&Ox4, ambulatory, and in NAD at time of d/c

## 2024-04-08 NOTE — ED Notes (Signed)
 Antibiotic restarted at this time per pharmacy request.

## 2024-04-08 NOTE — Discharge Instructions (Signed)
 You were seen for your abdominal pain in the emergency department.  It is likely from a condition called biliary colic from the sludge in your gallbladder.  At home, please take Tylenol  for your pain.  Take the Zofran  for any nausea or vomiting.  Take the levofloxacin we have prescribed you for your urinary tract infection.  Check your MyChart online for the results of any tests that had not resulted by the time you left the emergency department.   Follow-up with your primary doctor in 2-3 days regarding your visit.  Follow-up with a general surgeon to see if you need to have your gallbladder removed.  Return immediately to the emergency department if you experience any of the following: Worsening pain, fever, vomiting despite the medicine, or any other concerning symptoms.    Thank you for visiting our Emergency Department. It was a pleasure taking care of you today.

## 2024-04-10 LAB — URINE CULTURE: Culture: >=100000 — AB

## 2024-04-11 ENCOUNTER — Telehealth (HOSPITAL_BASED_OUTPATIENT_CLINIC_OR_DEPARTMENT_OTHER): Payer: Self-pay | Admitting: *Deleted

## 2024-04-11 NOTE — Telephone Encounter (Signed)
 Post ED Visit - Positive Culture Follow-up  Culture report reviewed by antimicrobial stewardship pharmacist: [facility] []  Rankin Dee, Pharm.D. []  Venetia Gully, [address].D., BCPS AQ-ID []  Garrel Crews, Pharm.D., BCPS []  Almarie Lunger, Pharm.D., BCPS []  [place], [address].D., BCPS, AAHIVP []  Rosaline Bihari, Pharm.D., BCPS, AAHIVP []  Vernell Meier, PharmD, BCPS []  Latanya Hint, PharmD, BCPS []  Donald Medley, PharmD, BCPS []  Rocky Bold, PharmD []  Dorothyann Alert, PharmD, BCPS [x]  Dorn Buttner, PharmD  [facility] []  Rosaline Edison, PharmD []  Romona Bliss, PharmD []  Dolphus Roller, PharmD []  Veva Seip, Rph []  Vernell Daunt) Leonce, PharmD []  Eva Allis, PharmD []  Rosaline Millet, PharmD []  Iantha Batch, PharmD []  Arvin Gauss, PharmD []  Wanda Hasting, PharmD []  Ronal Rav, PharmD []  Rocky Slade, PharmD []  Bard Jeans, PharmD   Positive urine culture Treated with Levofloxacin , organism sensitive to the same and no further patient follow-up is required at this time.  Albino Alan Novak 04/11/2024, 2:36 PM

## 2024-04-20 ENCOUNTER — Telehealth: Payer: Self-pay

## 2024-04-20 NOTE — Telephone Encounter (Signed)
 ATC Renee but could not reach at this time

## 2024-04-20 NOTE — Telephone Encounter (Signed)
 Copied from CRM #[id number]. Topic: Referral - Question >> Apr 19, 2024  2:37 PM Charolett L wrote: Reason for CRM: Patient daughter renee is requesting for a call back on the Neurologist referral  CB# [phone]

## 2024-04-26 NOTE — Telephone Encounter (Signed)
 ATC Renee but could not reach at this time

## 2024-05-07 ENCOUNTER — Ambulatory Visit: Payer: Self-pay

## 2024-05-07 NOTE — Telephone Encounter (Signed)
 Scheduled OV on Monday as no same day OV available. Is PCP able to order a UA/Culture today and then keep f/u for Monday or would he prefer they go to UC today? Please contact daughter via phone (not MyChart) to advise. She is aware to take her to UC if she does not hear back by end of day. She is aware to take her to the ED if she develops fever, chills, abdominal pain, flank pain, nausea or vomiting.  FYI Only or Action Required?: Action required by provider: clinical question for provider.  Patient was last seen in primary care on 03/22/2024 by Micheal Wolm ORN, MD.  Called Nurse Triage reporting Dysuria.  Symptoms began several days ago.  Interventions attempted: Nothing.  Symptoms are: gradually worsening.  Triage Disposition: Discuss With PCP and Callback by Nurse Today (overriding See Physician Within 24 Hours)  Patient/caregiver understands and will follow disposition?: Yes Reason for Disposition  Age > 50 years  More than 2 UTI's in last year  Answer Assessment - Initial Assessment Questions Patient's daughter states she has been dysuria with burning and frequency x 3 days and with mild confusion. Recently tx with levofloxacin  on 04/11/24 by ED for UTI d/t positive urine culture. Symptoms improved after levo, but symptoms returned 3 days ago. Denies c/o fever, chills, flank, nausea, vomiting.  Would like to discuss prophylaxis +/- urology referral at upcoming OV.  1. SEVERITY: How bad is the pain?  (e.g., Scale 1-10; mild, moderate, or severe)     Not with pt  2. FREQUENCY: How many times have you had painful urination today?      Many, q 20-30 minutes 3. PATTERN: Is pain present every time you urinate or just sometimes?      Not with pt 4. ONSET: When did the painful urination start?      3 days 5. FEVER: Do you have a fever? If Yes, ask: What is your temperature, how was it measured, and when did it start?     Denies 6. PAST UTI: Have you had a urine  infection before? If Yes, ask: When was the last time? and What happened that time?      Yes, last tx on 04/11/24  Protocols used: Urination Pain [facility] Copied from CRM #[id number]. Topic: Clinical - Medical Advice >> May 07, 2024 10:09 AM Hadassah PARAS wrote: Reason for CRM: Hisotry of reapted UTI. Daughter is calling to req medication for frequently urinating and burning sensation. Please advise on #[phone]

## 2024-05-10 ENCOUNTER — Encounter: Payer: Self-pay | Admitting: Family Medicine

## 2024-05-10 ENCOUNTER — Ambulatory Visit (INDEPENDENT_AMBULATORY_CARE_PROVIDER_SITE_OTHER): Admitting: Family Medicine

## 2024-05-10 VITALS — BP 146/60 | HR 59 | Temp 97.8°F | Wt 152.4 lb

## 2024-05-10 DIAGNOSIS — I10 Essential (primary) hypertension: Secondary | ICD-10-CM | POA: Diagnosis not present

## 2024-05-10 DIAGNOSIS — K828 Other specified diseases of gallbladder: Secondary | ICD-10-CM | POA: Diagnosis not present

## 2024-05-10 DIAGNOSIS — R300 Dysuria: Secondary | ICD-10-CM | POA: Diagnosis not present

## 2024-05-10 LAB — POC URINALSYSI DIPSTICK (AUTOMATED)
Bilirubin, UA: NEGATIVE
Blood, UA: NEGATIVE
Glucose, UA: NEGATIVE
Ketones, UA: NEGATIVE
Nitrite, UA: NEGATIVE
Protein, UA: POSITIVE — AB
Spec Grav, UA: 1.02
Urobilinogen, UA: 0.2 U/dL
pH, UA: 6

## 2024-05-10 NOTE — Patient Instructions (Signed)
 Stay well hydrated  Watch for any fever or worsening symptoms  We will call with urine culture results.

## 2024-05-10 NOTE — Progress Notes (Signed)
 "  Established Patient Office Visit  Subjective   Patient ID: Wendy Sandoval, female    DOB: Oct 11, 1940  Age: 84 y.o. MRN: [id number]  Chief Complaint  Patient presents with   Dysuria    HPI   Ms. Breece is seen today companied by daughter with concerns that she has had some intermittent dysuria past week.  Longstanding history of frequent UTIs.  Has seen urology in the past.  Had been taking Keflex  250 mg daily for prophylaxis.  She was seen in the ER on 04-08-2024 with some right upper quadrant abdominal pain which is relatively constant and dull and relatively mild.  Ultrasound showed some gallbladder sludge but no evidence for acute cholecystitis.  She has not had any recurrent right upper quadrant pain or recurrent nausea or vomiting since then.  Her workup otherwise revealed very abnormal urine with subsequent culture growing out Klebsiella which was resistant to multiple antibiotics but susceptible to Levaquin .  She was treated with Levaquin  750 mg every other day.  She had a white count 12.7 thousand.  Denies any fever currently.  No flank pain.  No nausea or vomiting.  There had been discussion of her seeing general surgeon but she has again had no further episodes of abdominal pain in terms of right upper quadrant pain, nausea, or vomiting.  She has hypertension treated with amlodipine  and HCTZ.  Also takes low-dose metoprolol  twice daily.  Denies any recent dizziness.  Past Medical History:  Diagnosis Date   Basal cell carcinoma 04/23/2011   upper left cheek (txpbx)   Chronic UTI (urinary tract infection)    recurrent   Clark level II melanoma (HCC) 10/09/2021   Left Elbow Post (excision)   Depression    DIVERTICULOSIS, COLON 03/17/2007   DJD (degenerative joint disease)    Esophageal stricture    Fatty liver    GERD 08/09/2008   Headache(784.0)    Hiatal hernia    HYPERCHOLESTEROLEMIA 07/07/2007   HYPERTENSION 07/07/2007   IBS (irritable bowel syndrome)    SCC  (squamous cell carcinoma) 08/18/2014   right forearm (txpbx) cautery    Schatzki's ring    Squamous cell carcinoma of skin 11/29/2010   left inner shin (cx13fu) KA   TMJ syndrome    Past Surgical History:  Procedure Laterality Date   ABDOMINAL HYSTERECTOMY  1998   APPENDECTOMY     BREAST EXCISIONAL BIOPSY Left    Benign    BREAST SURGERY     milk gland   LUNG SURGERY  1983    reports that she has never smoked. She has never used smokeless tobacco. She reports that she does not drink alcohol and does not use drugs. family history includes Alcohol abuse in an other family member; Arthritis in her mother and another family member; Diabetes in an other family member; Heart disease in her mother and another family member; Hyperlipidemia in an other family member; Hypertension in an other family member; Stroke in her father and another family member; Thyroid  disease in her father. Allergies[1]  Review of Systems  Constitutional:  Negative for chills and fever.  Respiratory:  Negative for shortness of breath.   Cardiovascular:  Negative for chest pain.  Genitourinary:  Positive for dysuria. Negative for flank pain and hematuria.      Objective:     BP (!) 146/60   Pulse (!) 59   Temp 97.8 F (36.6 C) (Oral)   Wt 152 lb 6.4 oz (69.1 kg)   SpO2 96%  BMI 27.87 kg/m  BP Readings from Last 3 Encounters:  05/10/24 (!) 146/60  04/08/24 111/60  03/22/24 (!) 160/70   Wt Readings from Last 3 Encounters:  05/10/24 152 lb 6.4 oz (69.1 kg)  04/08/24 158 lb 14.4 oz (72.1 kg)  03/22/24 158 lb 14.4 oz (72.1 kg)      Physical Exam Vitals reviewed.  Constitutional:      General: She is not in acute distress.    Appearance: She is not ill-appearing.  Cardiovascular:     Rate and Rhythm: Normal rate and regular rhythm.  Pulmonary:     Effort: Pulmonary effort is normal.     Breath sounds: Normal breath sounds. No wheezing or rales.  Abdominal:     General: There is no distension.      Palpations: Abdomen is soft.     Tenderness: There is no abdominal tenderness. There is no guarding.  Neurological:     Mental Status: She is alert.      Results for orders placed or performed in visit on 05/10/24  POCT Urinalysis Dipstick (Automated)  Result Value Ref Range   Color, UA Yellow    Clarity, UA Cloudy    Glucose, UA Negative Negative   Bilirubin, UA Negative    Ketones, UA Negative    Spec Grav, UA 1.020 1.010 - 1.025   Blood, UA Negative    pH, UA 6.0 5.0 - 8.0   Protein, UA Positive (A) Negative   Urobilinogen, UA 0.2 0.2 or 1.0 E.U./dL   Nitrite, UA Negative    Leukocytes, UA Small (1+) (A) Negative      The ASCVD Risk score (Arnett DK, et al., 2019) failed to calculate for the following reasons:   The 2019 ASCVD risk score is only valid for ages 28 to 61   * - Cholesterol units were assumed    Assessment & Plan:   #1 recurrent dysuria.  History of frequent UTIs.  Urine dipstick today is equivocal with 1+ leukocytes otherwise negative.  Urine culture sent.  Hold on additional antibiotics for now but follow-up immediately for any fever or worsening symptoms.  Stressed importance of staying well-hydrated  #2 recent abnormal ultrasound with gallbladder sludge but no evidence for acute cholecystitis.  Patient has had no further recurrent episodes.  No right upper quadrant abdominal tenderness at this time.  Observe but if she has any postprandial right upper quadrant pain or recurrent symptoms consider referral to general surgeon  #3 hypertension.  Blood pressure up just slightly today.  Continue current regimen.  Continue intermittent monitoring and be in touch for consistent systolic over 140   No follow-ups on file.    Wolm [name], MD     [1]  Allergies Allergen Reactions   Codeine Sulfate     REACTION: nausea   Histamine     Anxious feeling   "

## 2024-05-11 LAB — URINE CULTURE
MICRO NUMBER:: 17425862
Result:: NO GROWTH
SPECIMEN QUALITY:: ADEQUATE

## 2024-05-12 ENCOUNTER — Ambulatory Visit: Payer: Self-pay | Admitting: Family Medicine

## 2024-05-13 ENCOUNTER — Ambulatory Visit: Payer: Self-pay

## 2024-05-13 NOTE — Telephone Encounter (Signed)
 FYI Only or Action Required?: FYI only for provider: offered appt today, decline, advised that UC is an option, declined.  Patient was last seen in primary care on 05/10/2024 by Micheal Wolm ORN, MD.  Called Nurse Triage reporting Urinary Frequency.  Symptoms began a week ago.  Interventions attempted: Rest, hydration, or home remedies.  Symptoms are: gradually worsening.  Triage Disposition: See HCP Within 4 Hours (Or PCP Triage)  Patient/caregiver understands and will follow disposition?: No  Copied from CRM #[id number]. Topic: Clinical - Red Word Triage >> May 13, 2024 10:07 AM Avram MATSU wrote: Red Word that prompted transfer to Nurse Triage: burning, lower back pain,was seen Monday and symptoms getting worse. Reason for Disposition  Side (flank) or lower back pain present  Answer Assessment - Initial Assessment Questions 1. SEVERITY: How bad is the pain?  (e.g., Scale 1-10; mild, moderate, or severe)     Caller states she has not rated the pain,  2. FREQUENCY: How many times have you had painful urination today?      Each time she voids 3. PATTERN: Is pain present every time you urinate or just sometimes?      voiding 4. ONSET: When did the painful urination start?      Per caller about 10 days 5. FEVER: Do you have a fever? If Yes, ask: What is your temperature, how was it measured, and when did it start?     denies 6. PAST UTI: Have you had a urine infection before? If Yes, ask: When was the last time? and What happened that time?      Hx of UTI 7. CAUSE: What do you think is causing the painful urination?  (e.g., UTI, scratch, Herpes sore)     UTI 8. OTHER SYMPTOMS: Do you have any other symptoms? (e.g., blood in urine, flank pain, genital sores, urgency, vaginal discharge)     Confusion, burning with urination, lower back pain.  Caller was offered appt today states could not do the time that was offered. Caller was also advised that UC would be an  option, caller states that they misdiagnosed her brother and she does not want to go to UC. Caller states I will just let this play out and go to the emergency room if she gets sick.  Protocols used: Urination Pain - Female-A-AH

## 2024-05-13 NOTE — Telephone Encounter (Signed)
 Patient's daughter reported patient has been using regularly. Renee reported she will just monitor the patient's symptoms and will follow up for any persistent or worsening symptoms

## 2024-05-13 NOTE — Telephone Encounter (Signed)
 I spoke with the patient's daughter [name] and she was informed that patient's urine culture is was negative. Renee reported she has been giving the patient OTC Azos due to continued Urinary pain and frequency. Renee inquired if there is anything we can do for the patient moving forward?

## 2024-05-24 ENCOUNTER — Ambulatory Visit: Admitting: Family Medicine

## 2024-05-31 ENCOUNTER — Ambulatory Visit: Admitting: Family Medicine

## 2024-06-01 ENCOUNTER — Telehealth: Payer: Self-pay

## 2024-06-01 DIAGNOSIS — R413 Other amnesia: Secondary | ICD-10-CM

## 2024-06-01 MED ORDER — QUETIAPINE FUMARATE 25 MG PO TABS: 25.0000 mg | ORAL_TABLET | Freq: Two times a day (BID) | ORAL | 2 refills | Status: AC

## 2024-06-01 NOTE — Telephone Encounter (Signed)
 Copied from CRM [id number]. Topic: Clinical - Medication Question >> Jun 01, 2024  9:54 AM Deleta RAMAN wrote: Reason for CRM: [name] Would like to know if a medication would be good for her to take for anxiety. She would like for the pcp to give her a call to approve and prescribe medication. No particular medication just something to help with anxiety. [phone] renee contact information

## 2024-06-01 NOTE — Addendum Note (Signed)
 Addended by: MICHEAL WOLM ORN on: 06/01/2024 01:10 PM   Modules accepted: Orders
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
## 2024-06-02 ENCOUNTER — Other Ambulatory Visit: Payer: Self-pay | Admitting: Family Medicine

## 2024-06-03 ENCOUNTER — Telehealth: Payer: Self-pay

## 2024-06-03 MED ORDER — LORAZEPAM 0.5 MG PO TABS: ORAL_TABLET | ORAL | 0 refills | Status: AC

## 2024-06-03 NOTE — Telephone Encounter (Signed)
 I sent in a couple of tablets of Lorazepam  to take one about one hour prior to MRI scan.  Wolm LELON [name] MD [facility]

## 2024-06-03 NOTE — Telephone Encounter (Signed)
 Copied from CRM #[id number]. Topic: Clinical - Medication Question >> Jun 03, 2024 11:08 AM Carlyon D wrote: Reason for CRM: Pt daughter rene is calling in regards to mother is having a MRI tomorrow 1/30 and pt is extremely closterfobic, and is asking if something can be prescribed for this today for her to be able to relax. She states something was prescribed yesterday for anxiety and does not know if this will work and would like a call back. Jenna call back [phone]

## 2024-06-03 NOTE — Addendum Note (Signed)
 Addended by: MICHEAL WOLM ORN on: 06/03/2024 07:23 PM   Modules accepted: Orders

## 2024-06-04 ENCOUNTER — Ambulatory Visit
Admission: RE | Admit: 2024-06-04 | Discharge: 2024-06-04 | Disposition: A | Source: Ambulatory Visit | Attending: Family Medicine | Admitting: Family Medicine

## 2024-06-04 DIAGNOSIS — R413 Other amnesia: Secondary | ICD-10-CM

## 2024-06-04 NOTE — Telephone Encounter (Signed)
Patients daughter informed of message below.

## 2024-06-08 ENCOUNTER — Telehealth: Payer: Self-pay

## 2024-06-08 ENCOUNTER — Ambulatory Visit: Payer: Self-pay | Admitting: Family Medicine

## 2024-06-08 NOTE — Telephone Encounter (Signed)
 Copied from CRM #[id number]. Topic: Clinical - Lab/Test Results >> Jun 08, 2024 10:00 AM Sophia H wrote: Reason for CRM: Patients daughter is calling in to check on MRI Results - please reach out when you get a chance to discuss. TY   Daughter is requesting for Dr. Micheal to reach out personally.  # [phone]

## 2024-06-09 NOTE — Telephone Encounter (Signed)
 I spoke with Wendy Sandoval and she reported she will try to give patient 25 mg twice daily and will follow up if not improving

## 2024-06-10 ENCOUNTER — Other Ambulatory Visit

## 2024-09-14 ENCOUNTER — Ambulatory Visit: Admitting: Neurology

## 2024-11-24 ENCOUNTER — Ambulatory Visit
